# Patient Record
Sex: Male | Born: 1955
Health system: Southern US, Community
[De-identification: ages and names within clinical notes are randomized; demographics above are authoritative.]

## PROBLEM LIST (undated history)

## (undated) DIAGNOSIS — I10 Essential (primary) hypertension: Secondary | ICD-10-CM

## (undated) DIAGNOSIS — E559 Vitamin D deficiency, unspecified: Secondary | ICD-10-CM

## (undated) DIAGNOSIS — E119 Type 2 diabetes mellitus without complications: Secondary | ICD-10-CM

## (undated) DIAGNOSIS — E782 Mixed hyperlipidemia: Secondary | ICD-10-CM

## (undated) HISTORY — DX: Mixed hyperlipidemia: E78.2

## (undated) HISTORY — DX: Vitamin D deficiency, unspecified: E55.9

## (undated) HISTORY — DX: Essential (primary) hypertension: I10

## (undated) HISTORY — DX: Type 2 diabetes mellitus without complications: E11.9

---

## 1998-09-27 ENCOUNTER — Ambulatory Visit (HOSPITAL_COMMUNITY): Admission: RE | Admit: 1998-09-27 | Discharge: 1998-09-27 | Payer: Self-pay | Admitting: *Deleted

## 1998-09-27 ENCOUNTER — Encounter: Payer: Self-pay | Admitting: *Deleted

## 1998-11-01 ENCOUNTER — Emergency Department (HOSPITAL_COMMUNITY): Admission: EM | Admit: 1998-11-01 | Discharge: 1998-11-01 | Payer: Self-pay

## 1998-12-17 ENCOUNTER — Encounter: Payer: Self-pay | Admitting: Orthopedic Surgery

## 1998-12-17 ENCOUNTER — Ambulatory Visit (HOSPITAL_COMMUNITY): Admission: RE | Admit: 1998-12-17 | Discharge: 1998-12-17 | Payer: Self-pay | Admitting: Orthopedic Surgery

## 1999-01-30 ENCOUNTER — Encounter: Payer: Self-pay | Admitting: Neurosurgery

## 1999-02-01 ENCOUNTER — Inpatient Hospital Stay (HOSPITAL_COMMUNITY): Admission: RE | Admit: 1999-02-01 | Discharge: 1999-02-03 | Payer: Self-pay | Admitting: Neurosurgery

## 1999-02-01 ENCOUNTER — Encounter: Payer: Self-pay | Admitting: Neurosurgery

## 1999-09-08 ENCOUNTER — Emergency Department (HOSPITAL_COMMUNITY): Admission: EM | Admit: 1999-09-08 | Discharge: 1999-09-08 | Payer: Self-pay | Admitting: Emergency Medicine

## 2000-09-30 ENCOUNTER — Emergency Department (HOSPITAL_COMMUNITY): Admission: EM | Admit: 2000-09-30 | Discharge: 2000-09-30 | Payer: Self-pay | Admitting: Emergency Medicine

## 2000-11-05 ENCOUNTER — Encounter: Admission: RE | Admit: 2000-11-05 | Discharge: 2000-11-05 | Payer: Self-pay | Admitting: Internal Medicine

## 2001-05-20 ENCOUNTER — Emergency Department (HOSPITAL_COMMUNITY): Admission: EM | Admit: 2001-05-20 | Discharge: 2001-05-20 | Payer: Self-pay | Admitting: Emergency Medicine

## 2001-08-18 HISTORY — PX: CERVICAL DISCECTOMY: SHX98

## 2003-05-25 ENCOUNTER — Ambulatory Visit (HOSPITAL_COMMUNITY): Admission: RE | Admit: 2003-05-25 | Discharge: 2003-05-25 | Payer: Self-pay | Admitting: Internal Medicine

## 2003-05-25 ENCOUNTER — Encounter: Payer: Self-pay | Admitting: Internal Medicine

## 2003-12-24 ENCOUNTER — Emergency Department (HOSPITAL_COMMUNITY): Admission: EM | Admit: 2003-12-24 | Discharge: 2003-12-24 | Payer: Self-pay | Admitting: Emergency Medicine

## 2004-10-07 ENCOUNTER — Emergency Department (HOSPITAL_COMMUNITY): Admission: EM | Admit: 2004-10-07 | Discharge: 2004-10-07 | Payer: Self-pay | Admitting: Emergency Medicine

## 2005-06-23 ENCOUNTER — Emergency Department (HOSPITAL_COMMUNITY): Admission: EM | Admit: 2005-06-23 | Discharge: 2005-06-23 | Payer: Self-pay | Admitting: *Deleted

## 2006-08-18 HISTORY — PX: CARPAL TUNNEL RELEASE: SHX101

## 2007-01-01 ENCOUNTER — Ambulatory Visit (HOSPITAL_BASED_OUTPATIENT_CLINIC_OR_DEPARTMENT_OTHER): Admission: RE | Admit: 2007-01-01 | Discharge: 2007-01-01 | Payer: Self-pay | Admitting: Orthopedic Surgery

## 2007-02-23 ENCOUNTER — Ambulatory Visit (HOSPITAL_BASED_OUTPATIENT_CLINIC_OR_DEPARTMENT_OTHER): Admission: RE | Admit: 2007-02-23 | Discharge: 2007-02-23 | Payer: Self-pay | Admitting: Orthopedic Surgery

## 2007-07-02 ENCOUNTER — Emergency Department (HOSPITAL_COMMUNITY): Admission: EM | Admit: 2007-07-02 | Discharge: 2007-07-02 | Payer: Self-pay | Admitting: Emergency Medicine

## 2007-11-16 ENCOUNTER — Emergency Department (HOSPITAL_COMMUNITY): Admission: EM | Admit: 2007-11-16 | Discharge: 2007-11-16 | Payer: Self-pay | Admitting: Family Medicine

## 2008-08-18 HISTORY — PX: KNEE ARTHROSCOPY: SUR90

## 2008-11-03 ENCOUNTER — Emergency Department (HOSPITAL_COMMUNITY): Admission: EM | Admit: 2008-11-03 | Discharge: 2008-11-03 | Payer: Self-pay | Admitting: Family Medicine

## 2009-03-26 ENCOUNTER — Ambulatory Visit (HOSPITAL_BASED_OUTPATIENT_CLINIC_OR_DEPARTMENT_OTHER): Admission: RE | Admit: 2009-03-26 | Discharge: 2009-03-26 | Payer: Self-pay | Admitting: Orthopedic Surgery

## 2010-11-23 LAB — BASIC METABOLIC PANEL
BUN: 14 mg/dL (ref 6–23)
CO2: 29 mEq/L (ref 19–32)
Calcium: 9.4 mg/dL (ref 8.4–10.5)
Chloride: 103 mEq/L (ref 96–112)
Creatinine, Ser: 0.93 mg/dL (ref 0.4–1.5)
GFR calc Af Amer: >60 mL/min (ref >60)
GFR calc non Af Amer: >60 mL/min (ref >60)
Glucose, Bld: 128 mg/dL — ABNORMAL HIGH (ref 70–99)
Potassium: 3.8 mEq/L (ref 3.5–5.1)
Sodium: 140 mEq/L (ref 135–145)

## 2010-11-23 LAB — POCT HEMOGLOBIN-HEMACUE: Hemoglobin: 13.5 g/dL (ref 13.0–17.0)

## 2010-12-31 NOTE — Op Note (Signed)
[name], [name]              ACCOUNT NO.:  [id number]   MEDICAL RECORD NO.:  [id number]          PATIENT TYPE:  AMB   LOCATION:  DSC                          FACILITY:  [facility]   PHYSICIAN:  Katy Fitch. Sypher, M.D. DATE OF BIRTH:  29-Feb-1956   DATE OF PROCEDURE:  02/23/2007  DATE OF DISCHARGE:                               OPERATIVE REPORT   PREOPERATIVE DIAGNOSIS:  Entrapment neuropathy median nerve right carpal  tunnel.   POSTOPERATIVE DIAGNOSIS:  Entrapment neuropathy median nerve right  carpal tunnel.   OPERATION:  Release of right transverse carpal ligament.   SURGEON:  Katy Fitch. Sypher, M.D.   ASSISTANT:  Marveen Reeks. Dasnoit, P.A.-C.   ANESTHESIA:  General by LMA.   SUPERVISING ANESTHESIOLOGIST:  Zenon Mayo, M.D.   INDICATIONS:  [name] [name] is a 55 year old employee of [employer] [employer] [address] who was referred for evaluation and management of  bilateral carpal tunnel syndrome.  He is status post left carpal tunnel  release with a satisfactory result.  He now presents for a similar  surgery on the right.  Preoperative electrodiagnostic studies documented  moderately severe right carpal tunnel syndrome.  After informed consent,  he is brought to the operating room at this time.   PROCEDURE:  [name] [name] is brought to the operating room and placed  in the supine position on the operating table.  Following induction of  general anesthesia by LMA technique, the right arm was prepped with  Betadine soap solution and sterilely draped.  A pneumatic tourniquet was  applied to the proximal right brachium.  Following exsanguination of the  right arm with an Esmarch bandage, the arterial tourniquet was inflated  to 240 mmHg.  The procedure commenced with a short incision in line with  the ring finger in the palm.  The subcutaneous tissues were carefully  divided revealing the palmar fascia.  This was split longitudinally to  the common sensory branch  of the median nerve.  These were followed back  to transverse carpal ligament which was gently isolated from the median  nerve.  The ligament was then released along its ulnar border extending  into the distal forearm.  This widely opened the carpal canal.  No mass  or other predicaments were noted.  Bleeding points along the margin of  the released ligament were electrocauterized with bipolar current  followed by repair of the skin with intradermal 3-0 Prolene suture.  A  compressive dressing was applied with a volar plaster splint maintaining  the wrist in 5 degrees of dorsiflexion.   For aftercare, [name] [name] is provided with a prescription for Vicodin  7.5 mg 1 p.o. q.4-6h. p.r.n. pain, 20 tablets without refill.      Katy Fitch Sypher, M.D.  Electronically Signed     RVS/MEDQ  D:  02/23/2007  T:  02/23/2007  Job:  [id number]

## 2010-12-31 NOTE — Op Note (Signed)
NAMEWILLY, [name] [name]              ACCOUNT NO.:  [id number]   MEDICAL RECORD NO.:  [id number]          PATIENT TYPE:  AMB   LOCATION:  DSC                          FACILITY:  [facility]   PHYSICIAN:  Katy Fitch. Sypher, M.D. DATE OF BIRTH:  [birth year]   DATE OF PROCEDURE:  01/01/2007  DATE OF DISCHARGE:                               OPERATIVE REPORT   PREOPERATIVE DIAGNOSIS:  1. Entrapment neuropathy, median nerve, left carpal tunnel.  2. Entrapment neuropathy, median nerve, right carpal tunnel.   POSTOPERATIVE DIAGNOSES:  1. Entrapment neuropathy, median nerve, left carpal tunnel.  2. Entrapment neuropathy, median nerve, right carpal tunnel.   OPERATION:  1. Release of left transverse carpal ligament and decompression of      median nerve and contents of ulnar bursa, left wrist.  2. Injection of right ulnar bursa with Depo-Medrol 40 mg/mL and      lidocaine 1%.   OPERATING SURGEON:  [name] [name], M.D.   ASSISTANT:  [name] [name], P.A.-C.   ANESTHESIA:  General by LMA.   SUPERVISING ANESTHESIOLOGIST:  Dr. Sampson [name]   INDICATIONS:  [name] [name] is a 55 year old gentleman, referred  through the courtesy of Dr. Marisue [name] for evaluation and management  of bilateral hand numbness.  Clinical examination revealed signs of  probable carpal tunnel syndrome.  Electrodiagnostic studies confirmed  significant bilateral carpal tunnel syndrome.   Due to failed response to nonoperative measures, he is brought to the  operating at this time for release, anticipating release of the left  transverse carpal ligament and injection of his right ulnar bursa.   Preoperatively, he was seen for evaluation by Dr. Jacklynn [name] and Dr.  Sampson [name] in the holding area for an anesthesia consult.  An IV was  started in the right hand.   After informed consent, he is transported to room 6, placed in supine  position on the operating table and, under Dr. Marlane [name] direct  supervision, general  anesthesia by LMA technique induced.   The left arm was prepped with Betadine soap and solution, sterilely  draped.  A pneumatic tourniquet was applied to the proximal brachium.   Following exsanguination of the right arm with an Esmarch bandage,  arterial tourniquet was inflated to 240 mmHg.   The procedure commenced with a short incision in line of the ring finger  in the palm.   [name] [name] is a very large man with a body mass index of 40 and more  than a size 10 glove-shaped hand.   We dissected the superficial palmar tissues, identifying the palmar  fascia.  This was subsequently split longitudinally to reveal the common  sensory branches of the median nerve and superficial palmar arch.   The carpal canal was more than 2.5 cm below the surface of his palm.  We  meticulously identified the distal margin of the transverse carpal  ligament and dissected the vascular structures away from the ligament.  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  Given this predicament and the incidence of  motor branch anomalies, I meticulously dissected through the horseshoe  muscle with a Penfield 4 elevator and used scissors for essentially cell  by cell division of the muscle, looking for motor branches.   I was able to reach the level of the distal wrist flexion crease without  encountering any motor branches.   At this point in the dissection, given the massive size of his hand, I  elected to create a second incision at the level of the distal resection  crease.  A transverse incision was fashioned at this level and we went  down through the superficial and  deep fascia, identifying the flexor  tendons, ulnar bursa and median nerve.   The fascia was released ulnar to the median nerve and the volar forearm  fascia released 6 cm above the distal wrist flexion crease.   The anatomy at the heel of the hand was quite complex.  The  superficial  fascia was released under direct vision from proximal to distal  direction.  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   We moved our dissection ulnar to this point and completed the release of  the transverse carpal ligament.   The wound was inspected and found to have marked edema and hyperemia of  the median nerve, deep to the proximal half the transverse carpal  ligament.  There were  no other masses noted.   Bleeding points were electrocauterized, bipolar current, followed by  repair of the skin with intradermal 3-0 Prolene suture.   [name] [name] will likely have a small amount of muscle atrophy in his  hypothenar region due to our need to dissect through his horseshoe  muscle.  There is no question we will have denervated a portion of this  atypical muscle.   The wounds were dressed with Steri-Strips, sterile gauze, sterile Webril  and a volar plaster splint.  There were no apparent complications.   For aftercare, [name] [name] is provided a prescription for Percocet 5 mg  one or two tablets p.o. q. 4-6 hours p.r.n. pain; #20 tablets without  refill.  He is also provided Motrin 6 mg one p.o. q. 6 hours  p.r.n.  pain, #30 tablets with one refill.      Katy Fitch Sypher, M.D.  Electronically Signed     RVS/MEDQ  D:  01/01/2007  T:  01/01/2007  Job:  [id number]

## 2010-12-31 NOTE — Op Note (Signed)
[name] [name], [name] [name]              ACCOUNT NO.:  [id number]   MEDICAL RECORD NO.:  [id number]          PATIENT TYPE:  AMB   LOCATION:  DSC                          FACILITY:  [facility]   PHYSICIAN:  Robert A. Thurston Hole, M.D. DATE OF BIRTH:  1955/12/20   DATE OF PROCEDURE:  03/26/2009  DATE OF DISCHARGE:                               OPERATIVE REPORT   PREOPERATIVE DIAGNOSIS:  Left knee medial and lateral meniscal tears  with chondromalacia and synovitis.   POSTOPERATIVE DIAGNOSIS:  Left knee medial and lateral meniscal tears  with chondromalacia and synovitis.   PROCEDURE:  1. Left knee examination under anesthesia followed by arthroscopic      partial medial and lateral meniscectomies.  2. Left knee chondroplasty with partial synovectomy.   SURGEON:  Elana Alm. Thurston Hole, MD   ASSISTANT:  Julien Girt, PA   ANESTHESIA:  General.   OPERATIVE TIME:  45 minutes.   COMPLICATIONS:  None.   INDICATIONS FOR PROCEDURE:  Mr. Dupre is a 55 year old gentleman who  has had significant left knee pain increasing in nature over the past 3-  4 months with exam and MRI documenting meniscal tearing with  chondromalacia and synovitis.  He has failed conservative care and is  now to undergo arthroscopy.   DESCRIPTION:  Mr. Stthomas was brought to the operating room on March 26, 2009, after a knee block was placed in the holding area by Anesthesia.  He was placed on the operative table in the supine position.  His left  knee was examined under anesthesia.  He had full range of motion of knee  and stable ligamentous exam with normal patellar tracking.  Left leg was  prepped using sterile DuraPrep and draped using sterile technique.  Originally through an anterolateral portal, the arthroscope with a pump  attached was placed into an anteromedial portal and an arthroscopic  probe was placed.  On initial inspection on medial compartment, he was  found to have 25% grade 3 and the rest grade 1 and 2  chondromalacia  which was debrided.  Medial meniscus tear, posterior medial and anterior  horn of which 40-30% was resected back to a stable rim.  Intercondylar  notch was inspected.  Anterior and posterior cruciate ligaments were  normal.  Lateral compartment was inspected.  Grade 1 and 2  chondromalacia noted.  Lateral meniscus showed partial tearing 20%  posterolateral corner which was resected back to a stable rim.  Patellofemoral joint showed 25-30% grade 3 chondromalacia which was  debrided.  The patella tracked normally.  Significant synovitis in the  medial and lateral gutters debrided, otherwise this was free of  pathology.  After this was done, it was felt that all pathology had been  satisfactorily addressed.  The instruments were removed.  Portals were  closed with 3-0 nylon suture.  Sterile dressings were applied.  The  patient was awakened and taken to the recovery room in stable condition.   FOLLOWUP CARE:  Mr. Swaim will be followed as an outpatient on Vicodin  and Mobic.  He will be seen back in the office in a week  for sutures out  and followup.      Robert A. Thurston Hole, M.D.  Electronically Signed     RAW/MEDQ  D:  03/26/2009  T:  03/27/2009  Job:  [id number]

## 2011-06-03 LAB — BASIC METABOLIC PANEL
BUN: 17
CO2: 30
Calcium: 9.5
Chloride: 101
Creatinine, Ser: 1
GFR calc Af Amer: >60
GFR calc non Af Amer: >60
Glucose, Bld: 91
Potassium: 4
Sodium: 137

## 2011-06-03 LAB — POCT HEMOGLOBIN-HEMACUE
Hemoglobin: 14.4
Operator id: 116011

## 2012-02-17 ENCOUNTER — Other Ambulatory Visit (HOSPITAL_COMMUNITY): Payer: Self-pay | Admitting: Internal Medicine

## 2012-02-17 ENCOUNTER — Ambulatory Visit (HOSPITAL_COMMUNITY)
Admission: RE | Admit: 2012-02-17 | Discharge: 2012-02-17 | Disposition: A | Discharge status: Home / Self Care | Payer: BC Managed Care – PPO | Source: Ambulatory Visit | Attending: Internal Medicine | Admitting: Internal Medicine

## 2012-02-17 DIAGNOSIS — R0989 Other specified symptoms and signs involving the circulatory and respiratory systems: Secondary | ICD-10-CM | POA: Insufficient documentation

## 2012-02-17 DIAGNOSIS — R05 Cough: Secondary | ICD-10-CM

## 2012-02-17 DIAGNOSIS — R059 Cough, unspecified: Secondary | ICD-10-CM

## 2012-02-17 DIAGNOSIS — I10 Essential (primary) hypertension: Secondary | ICD-10-CM | POA: Insufficient documentation

## 2012-02-18 ENCOUNTER — Encounter: Payer: Self-pay | Admitting: Gastroenterology

## 2012-02-25 ENCOUNTER — Encounter: Payer: Self-pay | Admitting: Gastroenterology

## 2012-02-25 ENCOUNTER — Ambulatory Visit (AMBULATORY_SURGERY_CENTER): Payer: BC Managed Care – PPO | Admitting: *Deleted

## 2012-02-25 VITALS — Ht 68.5 in | Wt 307.0 lb

## 2012-02-25 DIAGNOSIS — Z1211 Encounter for screening for malignant neoplasm of colon: Secondary | ICD-10-CM

## 2012-02-25 MED ORDER — MOVIPREP 100 G PO SOLR: ORAL | Status: DC

## 2012-03-10 ENCOUNTER — Ambulatory Visit (AMBULATORY_SURGERY_CENTER): Payer: BC Managed Care – PPO | Admitting: Gastroenterology

## 2012-03-10 ENCOUNTER — Encounter: Payer: Self-pay | Admitting: Gastroenterology

## 2012-03-10 VITALS — BP 127/59 | HR 79 | Temp 96.9°F | Resp 15 | Ht 68.0 in | Wt 307.0 lb

## 2012-03-10 DIAGNOSIS — Z1211 Encounter for screening for malignant neoplasm of colon: Secondary | ICD-10-CM

## 2012-03-10 MED ORDER — SODIUM CHLORIDE 0.9 % IV SOLN: 500.0000 mL | INTRAVENOUS | Status: DC

## 2012-03-10 NOTE — Op Note (Signed)
[facility] [address] [place], [place]  [address]  COLONOSCOPY PROCEDURE REPORT  PATIENT:  [name], [name]  MR#:  [id number] BIRTHDATE:  August 22, 1955, 56 yrs. old  GENDER:  male ENDOSCOPIST:  Vania Rea. Jarold Motto, MD, [facility] REF. BY:  Lucky Cowboy, M.D. PROCEDURE DATE:  03/10/2012 PROCEDURE:  Average-risk screening colonoscopy G0121 ASA CLASS:  Class III INDICATIONS:  Routine Risk Screening MEDICATIONS:   propofol (Diprivan) 150 mg IV  DESCRIPTION OF PROCEDURE:   After the risks and benefits and of the procedure were explained, informed consent was obtained. Digital rectal exam was performed and revealed no abnormalities. The LB CF-H180AL [id number] endoscope was introduced through the anus and advanced to the cecum, which was identified by both the appendix and ileocecal valve.  The quality of the prep was adequate, using MoviPrep.  The instrument was then slowly withdrawn as the colon was fully examined. <<PROCEDUREIMAGES>>  FINDINGS:  No polyps or cancers were seen.  This was otherwise a normal examination of the colon.   Retroflexed views in the rectum revealed no abnormalities.    The scope was then withdrawn from the patient and the procedure completed.  COMPLICATIONS:  None ENDOSCOPIC IMPRESSION: 1) No polyps or cancers 2) Otherwise normal examination RECOMMENDATIONS: 1) Continue current colorectal screening recommendations for "routine risk" patients with a repeat colonoscopy in 10 years.  REPEAT EXAM:  No  ______________________________ Vania Rea. Jarold Motto, MD, [facility]  CC:  n. eSIGNED:   Vania Rea. Patterson at 03/10/2012 10:37 AM  Evelena Leyden, [id number]

## 2012-03-10 NOTE — Patient Instructions (Signed)
YOU HAD AN ENDOSCOPIC PROCEDURE TODAY AT THE [facility]: Refer to the procedure report that was given to you for any specific questions about what was found during the examination.  If the procedure report does not answer your questions, please call your gastroenterologist to clarify.  If you requested that your care partner not be given the details of your procedure findings, then the procedure report has been included in a sealed envelope for you to review at your convenience later.  YOU SHOULD EXPECT: Some feelings of bloating in the abdomen. Passage of more gas than usual.  Walking can help get rid of the air that was put into your GI tract during the procedure and reduce the bloating. If you had a lower endoscopy (such as a colonoscopy or flexible sigmoidoscopy) you may notice spotting of blood in your stool or on the toilet paper. If you underwent a bowel prep for your procedure, then you may not have a normal bowel movement for a few days.  DIET: Your first meal following the procedure should be a light meal and then it is ok to progress to your normal diet.  A half-sandwich or bowl of soup is an example of a good first meal.  Heavy or fried foods are harder to digest and may make you feel nauseous or bloated.  Likewise meals heavy in dairy and vegetables can cause extra gas to form and this can also increase the bloating.  Drink plenty of fluids but you should avoid alcoholic beverages for 24 hours.  ACTIVITY: Your care partner should take you home directly after the procedure.  You should plan to take it easy, moving slowly for the rest of the day.  You can resume normal activity the day after the procedure however you should NOT DRIVE or use heavy machinery for 24 hours (because of the sedation medicines used during the test).    SYMPTOMS TO REPORT IMMEDIATELY: A gastroenterologist can be reached at any hour.  During normal business hours, 8:30 AM to 5:00 PM Monday through Friday,  call [phone].  After hours and on weekends, please call the GI answering service at [phone] who will take a message and have the physician on call contact you.   Following lower endoscopy (colonoscopy or flexible sigmoidoscopy):  Excessive amounts of blood in the stool  Significant tenderness or worsening of abdominal pains  Swelling of the abdomen that is new, acute  Fever of 100F or higher  Following upper endoscopy (EGD)  Vomiting of blood or coffee ground material  New chest pain or pain under the shoulder blades  Painful or persistently difficult swallowing  New shortness of breath  Fever of 100F or higher  Black, tarry-looking stools  FOLLOW UP: If any biopsies were taken you will be contacted by phone or by letter within the next 1-3 weeks.  Call your gastroenterologist if you have not heard about the biopsies in 3 weeks.  Our staff will call the home number listed on your records the next business day following your procedure to check on you and address any questions or concerns that you may have at that time regarding the information given to you following your procedure. This is a courtesy call and so if there is no answer at the home number and we have not heard from you through the emergency physician on call, we will assume that you have returned to your regular daily activities without incident.  SIGNATURES/CONFIDENTIALITY: You and/or your care   partner have signed paperwork which will be entered into your electronic medical record.  These signatures attest to the fact that that the information above on your After Visit Summary has been reviewed and is understood.  Full responsibility of the confidentiality of this discharge information lies with you and/or your care-partner.  

## 2012-03-10 NOTE — Progress Notes (Signed)
Patient did not experience any of the following events: a burn prior to discharge; a fall within the facility; wrong site/side/patient/procedure/implant event; or a hospital transfer or hospital admission upon discharge from the facility. (G8907) Patient did not have preoperative order for IV antibiotic SSI prophylaxis. (G8918)  

## 2012-03-11 ENCOUNTER — Telehealth: Payer: Self-pay

## 2012-03-11 NOTE — Telephone Encounter (Signed)
  Follow up Call-  Call back number 03/10/2012  Post procedure Call Back phone  # [phone]  Permission to leave phone message Yes     Patient questions:  Do you have a fever, pain , or abdominal swelling? no Pain Score  0 *  Have you tolerated food without any problems? yes  Have you been able to return to your normal activities? yes  Do you have any questions about your discharge instructions: Diet   no Medications  no Follow up visit  no  Do you have questions or concerns about your Care? no  Actions: * If pain score is 4 or above: No action needed, pain <4.  Per the pt, "I am fine". Maw

## 2012-09-29 ENCOUNTER — Emergency Department (HOSPITAL_COMMUNITY): Payer: BC Managed Care – PPO

## 2012-09-29 ENCOUNTER — Inpatient Hospital Stay (HOSPITAL_COMMUNITY)
Admission: EM | Admit: 2012-09-29 | Discharge: 2012-10-01 | DRG: 566 | Disposition: A | Discharge status: Home / Self Care | Payer: BC Managed Care – PPO | Attending: Internal Medicine | Admitting: Internal Medicine

## 2012-09-29 ENCOUNTER — Encounter (HOSPITAL_COMMUNITY): Payer: Self-pay | Admitting: *Deleted

## 2012-09-29 DIAGNOSIS — Y92009 Unspecified place in unspecified non-institutional (private) residence as the place of occurrence of the external cause: Secondary | ICD-10-CM

## 2012-09-29 DIAGNOSIS — T383X5A Adverse effect of insulin and oral hypoglycemic [antidiabetic] drugs, initial encounter: Secondary | ICD-10-CM | POA: Diagnosis present

## 2012-09-29 DIAGNOSIS — E669 Obesity, unspecified: Secondary | ICD-10-CM

## 2012-09-29 DIAGNOSIS — E1101 Type 2 diabetes mellitus with hyperosmolarity with coma: Secondary | ICD-10-CM

## 2012-09-29 DIAGNOSIS — K219 Gastro-esophageal reflux disease without esophagitis: Secondary | ICD-10-CM

## 2012-09-29 DIAGNOSIS — E782 Mixed hyperlipidemia: Secondary | ICD-10-CM

## 2012-09-29 DIAGNOSIS — E860 Dehydration: Secondary | ICD-10-CM

## 2012-09-29 DIAGNOSIS — Z79899 Other long term (current) drug therapy: Secondary | ICD-10-CM

## 2012-09-29 DIAGNOSIS — E875 Hyperkalemia: Secondary | ICD-10-CM

## 2012-09-29 DIAGNOSIS — N179 Acute kidney failure, unspecified: Secondary | ICD-10-CM

## 2012-09-29 DIAGNOSIS — Z6841 Body Mass Index (BMI) 40.0 and over, adult: Secondary | ICD-10-CM

## 2012-09-29 DIAGNOSIS — E1169 Type 2 diabetes mellitus with other specified complication: Secondary | ICD-10-CM | POA: Diagnosis present

## 2012-09-29 DIAGNOSIS — T46905A Adverse effect of unspecified agents primarily affecting the cardiovascular system, initial encounter: Secondary | ICD-10-CM | POA: Diagnosis present

## 2012-09-29 DIAGNOSIS — R251 Tremor, unspecified: Secondary | ICD-10-CM

## 2012-09-29 DIAGNOSIS — R259 Unspecified abnormal involuntary movements: Secondary | ICD-10-CM | POA: Diagnosis present

## 2012-09-29 DIAGNOSIS — Z87891 Personal history of nicotine dependence: Secondary | ICD-10-CM

## 2012-09-29 DIAGNOSIS — I10 Essential (primary) hypertension: Secondary | ICD-10-CM | POA: Diagnosis present

## 2012-09-29 DIAGNOSIS — E1100 Type 2 diabetes mellitus with hyperosmolarity without nonketotic hyperglycemic-hyperosmolar coma (NKHHC): Principal | ICD-10-CM

## 2012-09-29 DIAGNOSIS — J309 Allergic rhinitis, unspecified: Secondary | ICD-10-CM | POA: Diagnosis present

## 2012-09-29 DIAGNOSIS — E785 Hyperlipidemia, unspecified: Secondary | ICD-10-CM

## 2012-09-29 HISTORY — DX: Mixed hyperlipidemia: E78.2

## 2012-09-29 LAB — COMPREHENSIVE METABOLIC PANEL
ALT: 13 U/L (ref 0–53)
ALT: 12 U/L (ref 0–53)
AST: 17 U/L (ref 0–37)
AST: 16 U/L (ref 0–37)
Albumin: 4.4 g/dL (ref 3.5–5.2)
Albumin: 4.2 g/dL (ref 3.5–5.2)
Alkaline Phosphatase: 110 U/L (ref 39–117)
Alkaline Phosphatase: 104 U/L (ref 39–117)
BUN: 33 mg/dL — ABNORMAL HIGH (ref 6–23)
BUN: 32 mg/dL — ABNORMAL HIGH (ref 6–23)
CO2: 19 mEq/L (ref 19–32)
CO2: 20 mEq/L (ref 19–32)
Calcium: 10.9 mg/dL — ABNORMAL HIGH (ref 8.4–10.5)
Calcium: 10.7 mg/dL — ABNORMAL HIGH (ref 8.4–10.5)
Chloride: 88 mEq/L — ABNORMAL LOW (ref 96–112)
Chloride: 96 mEq/L (ref 96–112)
Creatinine, Ser: 1.67 mg/dL — ABNORMAL HIGH (ref 0.50–1.35)
Creatinine, Ser: 1.71 mg/dL — ABNORMAL HIGH (ref 0.50–1.35)
GFR calc Af Amer: 51 mL/min — ABNORMAL LOW (ref >90)
GFR calc Af Amer: 50 mL/min — ABNORMAL LOW (ref >90)
GFR calc non Af Amer: 44 mL/min — ABNORMAL LOW (ref >90)
GFR calc non Af Amer: 43 mL/min — ABNORMAL LOW (ref >90)
Glucose, Bld: 1233 mg/dL (ref 70–99)
Glucose, Bld: 913 mg/dL (ref 70–99)
Potassium: 6.2 mEq/L — ABNORMAL HIGH (ref 3.5–5.1)
Potassium: 4.6 mEq/L (ref 3.5–5.1)
Sodium: 133 mEq/L — ABNORMAL LOW (ref 135–145)
Sodium: 141 mEq/L (ref 135–145)
Total Bilirubin: 0.6 mg/dL (ref 0.3–1.2)
Total Bilirubin: 0.5 mg/dL (ref 0.3–1.2)
Total Protein: 9.4 g/dL — ABNORMAL HIGH (ref 6.0–8.3)
Total Protein: 9.1 g/dL — ABNORMAL HIGH (ref 6.0–8.3)

## 2012-09-29 LAB — URINALYSIS, ROUTINE W REFLEX MICROSCOPIC
Bilirubin Urine: NEGATIVE
Glucose, UA: >1000 mg/dL — AB
Hgb urine dipstick: NEGATIVE
Ketones, ur: 15 mg/dL — AB
Leukocytes, UA: NEGATIVE
Nitrite: NEGATIVE
Protein, ur: NEGATIVE mg/dL
Specific Gravity, Urine: 1.034 — ABNORMAL HIGH (ref 1.005–1.030)
Urobilinogen, UA: 0.2 mg/dL (ref 0.0–1.0)
pH: 5 (ref 5.0–8.0)

## 2012-09-29 LAB — CBC WITH DIFFERENTIAL/PLATELET
Basophils Absolute: 0 10*3/uL (ref 0.0–0.1)
Basophils Relative: 0 % (ref 0–1)
Eosinophils Absolute: 0 10*3/uL (ref 0.0–0.7)
Eosinophils Relative: 0 % (ref 0–5)
HCT: 50.8 % (ref 39.0–52.0)
Hemoglobin: 17.4 g/dL — ABNORMAL HIGH (ref 13.0–17.0)
Lymphocytes Relative: 19 % (ref 12–46)
Lymphs Abs: 1.4 10*3/uL (ref 0.7–4.0)
MCH: 27 pg (ref 26.0–34.0)
MCHC: 34.3 g/dL (ref 30.0–36.0)
MCV: 78.8 fL (ref 78.0–100.0)
Monocytes Absolute: 0.4 10*3/uL (ref 0.1–1.0)
Monocytes Relative: 5 % (ref 3–12)
Neutro Abs: 5.5 10*3/uL (ref 1.7–7.7)
Neutrophils Relative %: 76 % (ref 43–77)
Platelets: 247 10*3/uL (ref 150–400)
RBC: 6.45 MIL/uL — ABNORMAL HIGH (ref 4.22–5.81)
RDW: 12.7 % (ref 11.5–15.5)
WBC: 6.7 10*3/uL (ref 4.0–10.5)

## 2012-09-29 LAB — GLUCOSE, CAPILLARY
Glucose-Capillary: >600 mg/dL (ref 70–99)
Glucose-Capillary: >600 mg/dL (ref 70–99)
Glucose-Capillary: >600 mg/dL (ref 70–99)
Glucose-Capillary: >600 mg/dL (ref 70–99)
Glucose-Capillary: >600 mg/dL (ref 70–99)
Glucose-Capillary: 563 mg/dL (ref 70–99)
Glucose-Capillary: 497 mg/dL — ABNORMAL HIGH (ref 70–99)
Glucose-Capillary: 496 mg/dL — ABNORMAL HIGH (ref 70–99)

## 2012-09-29 LAB — CBC
HCT: 45.5 % (ref 39.0–52.0)
Hemoglobin: 15.3 g/dL (ref 13.0–17.0)
MCH: 27.1 pg (ref 26.0–34.0)
MCHC: 33.6 g/dL (ref 30.0–36.0)
MCV: 80.5 fL (ref 78.0–100.0)
Platelets: 273 10*3/uL (ref 150–400)
RBC: 5.65 MIL/uL (ref 4.22–5.81)
RDW: 13 % (ref 11.5–15.5)
WBC: 7.6 10*3/uL (ref 4.0–10.5)

## 2012-09-29 LAB — POCT I-STAT 3, VENOUS BLOOD GAS (G3P V)
Acid-base deficit: 2 mmol/L (ref 0.0–2.0)
Bicarbonate: 22.6 mEq/L (ref 20.0–24.0)
O2 Saturation: 95 %
TCO2: 24 mmol/L (ref 0–100)
pCO2, Ven: 39.3 mmHg — ABNORMAL LOW (ref 45.0–50.0)
pH, Ven: 7.368 — ABNORMAL HIGH (ref 7.250–7.300)
pO2, Ven: 76 mmHg — ABNORMAL HIGH (ref 30.0–45.0)

## 2012-09-29 LAB — ETHANOL: Alcohol, Ethyl (B): <11 mg/dL (ref 0–11)

## 2012-09-29 LAB — RAPID URINE DRUG SCREEN, HOSP PERFORMED
Amphetamines: NOT DETECTED
Barbiturates: NOT DETECTED
Benzodiazepines: NOT DETECTED
Cocaine: NOT DETECTED
Opiates: NOT DETECTED
Tetrahydrocannabinol: NOT DETECTED

## 2012-09-29 LAB — LIPASE, BLOOD: Lipase: 61 U/L — ABNORMAL HIGH (ref 11–59)

## 2012-09-29 LAB — URINE MICROSCOPIC-ADD ON: WBC, UA: NONE SEEN WBC/hpf (ref <3)

## 2012-09-29 LAB — PHOSPHORUS: Phosphorus: 5.2 mg/dL — ABNORMAL HIGH (ref 2.3–4.6)

## 2012-09-29 LAB — MAGNESIUM: Magnesium: 3.7 mg/dL — ABNORMAL HIGH (ref 1.5–2.5)

## 2012-09-29 LAB — TROPONIN I: Troponin I: <0.3 ng/mL (ref <0.30)

## 2012-09-29 MED ORDER — HYDRALAZINE HCL 20 MG/ML IJ SOLN
10.0000 mg | Freq: Three times a day (TID) | INTRAMUSCULAR | Status: DC | PRN
  Filled 2012-09-29: qty 0.5

## 2012-09-29 MED ORDER — SODIUM CHLORIDE 0.9 % IV SOLN
Freq: Once | INTRAVENOUS | Status: AC
  Administered 2012-09-29: 16:00:00 via INTRAVENOUS

## 2012-09-29 MED ORDER — DILTIAZEM HCL ER COATED BEADS 120 MG PO CP24
120.0000 mg | ORAL_CAPSULE | Freq: Every day | ORAL | Status: DC
  Administered 2012-09-29 – 2012-10-01 (×3): 120 mg via ORAL
  Filled 2012-09-29 (×3): qty 1

## 2012-09-29 MED ORDER — SODIUM POLYSTYRENE SULFONATE 15 GM/60ML PO SUSP
45.0000 g | Freq: Once | ORAL | Status: AC
  Administered 2012-09-29: 45 g via ORAL
  Filled 2012-09-29: qty 180

## 2012-09-29 MED ORDER — ATORVASTATIN CALCIUM 20 MG PO TABS
20.0000 mg | ORAL_TABLET | Freq: Every day | ORAL | Status: DC
  Administered 2012-09-29 – 2012-09-30 (×2): 20 mg via ORAL
  Filled 2012-09-29 (×3): qty 1

## 2012-09-29 MED ORDER — ACETAMINOPHEN 650 MG RE SUPP: 650.0000 mg | Freq: Four times a day (QID) | RECTAL | Status: DC | PRN

## 2012-09-29 MED ORDER — SIMVASTATIN 40 MG PO TABS: 40.0000 mg | ORAL_TABLET | Freq: Every evening | ORAL | Status: DC

## 2012-09-29 MED ORDER — SODIUM CHLORIDE 0.9 % IV SOLN
INTRAVENOUS | Status: DC
  Administered 2012-09-29 – 2012-09-30 (×2): via INTRAVENOUS

## 2012-09-29 MED ORDER — SODIUM CHLORIDE 0.9 % IV SOLN: 1.0000 g | Freq: Once | INTRAVENOUS | Status: DC

## 2012-09-29 MED ORDER — ACETAMINOPHEN 325 MG PO TABS: 650.0000 mg | ORAL_TABLET | Freq: Four times a day (QID) | ORAL | Status: DC | PRN

## 2012-09-29 MED ORDER — MORPHINE SULFATE 2 MG/ML IJ SOLN: 1.0000 mg | INTRAMUSCULAR | Status: DC | PRN

## 2012-09-29 MED ORDER — ONDANSETRON HCL 4 MG PO TABS: 4.0000 mg | ORAL_TABLET | Freq: Four times a day (QID) | ORAL | Status: DC | PRN

## 2012-09-29 MED ORDER — SODIUM CHLORIDE 0.9 % IJ SOLN
3.0000 mL | Freq: Two times a day (BID) | INTRAMUSCULAR | Status: DC
  Administered 2012-09-29 – 2012-09-30 (×2): 3 mL via INTRAVENOUS

## 2012-09-29 MED ORDER — SODIUM CHLORIDE 0.9 % IV BOLUS (SEPSIS)
1000.0000 mL | Freq: Once | INTRAVENOUS | Status: AC
  Administered 2012-09-29: 1000 mL via INTRAVENOUS

## 2012-09-29 MED ORDER — GI COCKTAIL ~~LOC~~
30.0000 mL | Freq: Two times a day (BID) | ORAL | Status: DC | PRN
  Filled 2012-09-29: qty 30

## 2012-09-29 MED ORDER — PANTOPRAZOLE SODIUM 40 MG PO TBEC
40.0000 mg | DELAYED_RELEASE_TABLET | Freq: Two times a day (BID) | ORAL | Status: DC
  Administered 2012-09-29 – 2012-10-01 (×4): 40 mg via ORAL
  Filled 2012-09-29 (×4): qty 1

## 2012-09-29 MED ORDER — INSULIN ASPART 100 UNIT/ML ~~LOC~~ SOLN
10.0000 [IU] | Freq: Once | SUBCUTANEOUS | Status: AC
  Administered 2012-09-29: 10 [IU] via INTRAVENOUS
  Filled 2012-09-29: qty 1

## 2012-09-29 MED ORDER — POLYETHYLENE GLYCOL 3350 17 G PO PACK
17.0000 g | PACK | Freq: Every day | ORAL | Status: DC
  Administered 2012-10-01: 17 g via ORAL
  Filled 2012-09-29 (×3): qty 1

## 2012-09-29 MED ORDER — ONDANSETRON HCL 4 MG/2ML IJ SOLN: 4.0000 mg | Freq: Four times a day (QID) | INTRAMUSCULAR | Status: DC | PRN

## 2012-09-29 MED ORDER — SODIUM CHLORIDE 0.9 % IV SOLN
INTRAVENOUS | Status: DC
  Administered 2012-09-29: 16.2 [IU]/h via INTRAVENOUS
  Administered 2012-09-29: 26.2 [IU]/h via INTRAVENOUS
  Administered 2012-09-29: 30.5 [IU]/h via INTRAVENOUS
  Administered 2012-09-29: 21.6 [IU]/h via INTRAVENOUS
  Administered 2012-09-29: 26.2 [IU]/h via INTRAVENOUS
  Administered 2012-09-29: 5.4 [IU]/h via INTRAVENOUS
  Administered 2012-09-30: 8.4 [IU]/h via INTRAVENOUS
  Administered 2012-09-30: 15.9 [IU]/h via INTRAVENOUS
  Administered 2012-09-30: 16.4 [IU]/h via INTRAVENOUS
  Administered 2012-09-30: 18.8 [IU]/h via INTRAVENOUS
  Administered 2012-09-30: 21.4 [IU]/h via INTRAVENOUS
  Administered 2012-09-30: 9.8 [IU]/h via INTRAVENOUS
  Filled 2012-09-29 (×3): qty 1

## 2012-09-29 MED ORDER — HEPARIN SODIUM (PORCINE) 5000 UNIT/ML IJ SOLN
5000.0000 [IU] | Freq: Three times a day (TID) | INTRAMUSCULAR | Status: DC
  Administered 2012-09-29 – 2012-10-01 (×6): 5000 [IU] via SUBCUTANEOUS
  Filled 2012-09-29 (×9): qty 1

## 2012-09-29 NOTE — ED Provider Notes (Signed)
History     CSN: [id number]  Arrival date & time 09/29/12  1118   First MD Initiated Contact with Patient 09/29/12 1129      Chief Complaint  Patient presents with  . Tremors    (Consider location/radiation/quality/duration/timing/severity/associated sxs/prior treatment) HPI  The patient presents with new shakiness.  He states that until 2 days ago he was in his usual state of health.  He states that he has been compliant with all medications, has no similar events in his past.  He states that over the past 2 days he has had innumerable episodes of right-sided shaking and tremors.  There are no clear precipitating, alleviating, exacerbating factors. There is no associated confusion, disorientation.  There is some loss of intentional function on the right side when there are tremors. There is no associated chest pain, lightheadedness, dyspnea, syncope, nausea, vomiting, diarrhea.   Past Medical History  Diagnosis Date  . Hypertension   . Seasonal allergies   . Hyperlipidemia     Past Surgical History  Procedure Laterality Date  . Carpal tunnel release  2008    bilateral  . Knee arthroscopy  2010    left  . Cervical discectomy  2003    Family History  Problem Relation Age of Onset  . Colon cancer Neg Hx   . Stomach cancer Neg Hx     History  Substance Use Topics  . Smoking status: Former Smoker    Quit date: 02/25/1991  . Smokeless tobacco: Never Used  . Alcohol Use: No      Review of Systems  Constitutional:       Per HPI, otherwise negative  HENT:       Per HPI, otherwise negative  Respiratory:       Per HPI, otherwise negative  Cardiovascular:       Per HPI, otherwise negative  Gastrointestinal: Negative for vomiting.  Endocrine:       Negative aside from HPI  Genitourinary:       Neg aside from HPI   Musculoskeletal:       Per HPI, otherwise negative  Skin: Negative.   Neurological: Positive for tremors. Negative for seizures, syncope, weakness  and headaches.    Allergies  Review of patient's allergies indicates no known allergies.  Home Medications   Current Outpatient Rx  Name  Route  Sig  Dispense  Refill  . Cholecalciferol (VITAMIN D-3) 5000 UNITS TABS   Oral   Take 1 tablet by mouth daily.         Marland Kitchen diltiazem (CARDIZEM CD) 120 MG 24 hr capsule   Oral   Take 120 mg by mouth daily.         Marland Kitchen losartan (COZAAR) 100 MG tablet   Oral   Take 100 mg by mouth daily.         . Magnesium-Zinc (MAGNESIUM-CHELATED ZINC PO)   Oral   Take 1 tablet by mouth daily.         . metFORMIN (GLUCOPHAGE) 500 MG tablet   Oral   Take 1,000 mg by mouth 2 (two) times daily with a meal.         . Multiple Vitamins-Minerals (THERAGRAN-M PREMIER 50 PLUS PO)   Oral   Take 1 tablet by mouth daily.         Marland Kitchen omega-3 acid ethyl esters (LOVAZA) 1 G capsule   Oral   Take 1 g by mouth daily.         Marland Kitchen  simvastatin (ZOCOR) 40 MG tablet   Oral   Take 40 mg by mouth every evening.           BP 114/58  Pulse 102  Temp(Src) 98 F (36.7 C) (Oral)  Resp 20  SpO2 93%  Physical Exam  Nursing note and vitals reviewed. Constitutional: He is oriented to person, place, and time. He appears well-developed. No distress.  HENT:  Head: Normocephalic and atraumatic.  Eyes: Conjunctivae and EOM are normal.  Pupils are pinpoint, though the patient has been sitting in a dark room.  He tracks appropriately.  Cardiovascular: Normal rate and regular rhythm.   Pulmonary/Chest: Effort normal. No stridor. No respiratory distress.  Abdominal: He exhibits no distension.  Musculoskeletal: He exhibits no edema.  Neurological: He is alert and oriented to person, place, and time.  With testing the patient has appropriate strength bilaterally.  There are intermittent episodes of clonus throughout the physical exam.  These occurred both the right upper and right lower extremity.  They're not consistent, and they do not seem to occur with any  clear precipitant.  Skin: Skin is warm and dry.  Psychiatric: He has a normal mood and affect.    ED Course  Procedures (including critical care time)  Labs Reviewed  URINALYSIS, ROUTINE W REFLEX MICROSCOPIC - Abnormal; Notable for the following:    Specific Gravity, Urine 1.034 (*)    Glucose, UA >1000 (*)    Ketones, ur 15 (*)    All other components within normal limits  GLUCOSE, CAPILLARY - Abnormal; Notable for the following:    Glucose-Capillary >600 (*)    All other components within normal limits  URINE RAPID DRUG SCREEN (HOSP PERFORMED)  URINE MICROSCOPIC-ADD ON  COMPREHENSIVE METABOLIC PANEL  ETHANOL  LIPASE, BLOOD  CBC WITH DIFFERENTIAL  CBC  DIFFERENTIAL  BLOOD GAS, VENOUS   Dg Chest 2 View  09/29/2012  *RADIOLOGY REPORT*  Clinical Data: Cough, dizziness, jerking right arm, history hypertension  CHEST - 2 VIEW  Comparison: 02/17/2012  Findings: Normal heart size, mediastinal contours, and pulmonary vascularity. Linear subsegmental atelectasis versus scarring at lingula. Lungs otherwise clear. No pleural effusion or pneumothorax. Prior cervical spine fusion. Bulky endplate spurs inferior thoracic spine.  IMPRESSION: Minimal linear subsegmental atelectasis versus scarring lingula.   Original Report Authenticated By: Ulyses Southward, M.D.    Ct Head Wo Contrast  09/29/2012  *RADIOLOGY REPORT*  Clinical Data: Altered mental status.  Tremors.  CT HEAD WITHOUT CONTRAST  Technique:  Contiguous axial images were obtained from the base of the skull through the vertex without contrast.  Comparison: None.  Findings: There is some mild hypoattenuation in the subcortical deep white matter most consistent with chronic microvascular ischemic change.  No evidence of acute abnormality including infarct, hemorrhage, mass lesion, mass effect, midline shift or abnormal extra-axial fluid collection.  No hydrocephalus or pneumocephalus.  The calvarium is intact.  IMPRESSION: No acute finding.    Original Report Authenticated By: Holley Dexter, M.D.      No diagnosis found.  Pulse ox 99% room air normal Cardiac 110 sinus tach abnormal   Date: 09/29/2012  Rate: 99  Rhythm: normal sinus rhythm  QRS Axis: normal  Intervals: normal  ST/T Wave abnormalities: normal  Conduction Disutrbances:none  Narrative Interpretation:   Old EKG Reviewed: none available Q-waves inferior - abnormal  There is substantial difficulty in obtaining blood for staple initially.  After a delay the patient's blood work began to return with labs most notable for hyperglycemia.  Chem panel shows hypercalcemia, as well as hyperglycemia and acute renal dysfunction.  Kayexelate and insulin provided.  3:48 PM Patient aware of all results thus far.  Though there is some ketonuria, this appears to be c/w HONK  Update: Patient continues to have intermittent clonic activity of the right arm.  MDM  This patient with type 2 diabetes now presents with right sided clonic activity.  On exam the patient has intermittent spasms of activity, as well as miosis in spite of being in a dark room.  The patient is mentating well, with vital signs are largely reassuring.  However, the patient's labs are significantly abnormal, consistent with hyperosmolar state, with mixed picture due to the presence of ketones in his urine.  The patient received fluids, insulin, was started on an insulin drip, required admission for further evaluation and management of his hyperosmolar ketotic state.  CRITICAL CARE Performed by: Gerhard Munch   Total critical care time: 40  Critical care time was exclusive of separately billable procedures and treating other patients.  Critical care was necessary to treat or prevent imminent or life-threatening deterioration.  Critical care was time spent personally by me on the following activities: development of treatment plan with patient and/or surrogate as well as nursing, discussions with  consultants, evaluation of patient's response to treatment, examination of patient, obtaining history from patient or surrogate, ordering and performing treatments and interventions, ordering and review of laboratory studies, ordering and review of radiographic studies, pulse oximetry and re-evaluation of patient's condition.        Gerhard Munch, MD 09/29/12 [phone]

## 2012-09-29 NOTE — H&P (Signed)
[facility] History and Physical  HESHAM WOMAC ZOX:[id number] DOB: Jun 24, 1956 DOA: 09/29/2012  Referring physician: Dr. Jeraldine Loots PCP: Nadean Corwin, MD   Chief Complaint: RUE tremor, polyuria, polydipsia, dehydration and hyperglycemia  HPI: [name] [name] is a 57 y.o. male with pmh of DM, HTN, HLD, obesity; came to the hospital secondary to RUE shaking, decrease appetite and excessive urination. Patient reports for the last 2 days he has been experiencing RUE intentional tremor, associated anorexia, excessive urination, thirst and dry mouth. Patient also reports epigastric discomfort. Patient at this moment denies fever, chills, SOB, dysuria, cough or any other acute complaints. In the ED was found with potassium 6.2, CR 1.67; dehydrated with specific gravity of 1.034 and CBG's > 1000; he received kayexalate, IVF's resuscitation, insulin 10 units and TRH called to admit for further evaluation and treatment.  Of note CT head done in ED for new onset tremors was negative for acute intracranial abnormalities.  Review of Systems:  Negative except as mentioned on HPI.   Past Medical History  Diagnosis Date  . Hypertension   . Seasonal allergies   . Hyperlipidemia    Past Surgical History  Procedure Laterality Date  . Carpal tunnel release  2008    bilateral  . Knee arthroscopy  2010    left  . Cervical discectomy  2003   Social History:  reports that he quit smoking about 21 years ago. He has never used smokeless tobacco. He reports that he does not drink alcohol or use illicit drugs. Lives at home by himself; denies any assistance with ADL's.   No Known Allergies  Family History  Problem Relation Age of Onset  . Colon cancer Neg Hx   . Stomach cancer Neg Hx     Prior to Admission medications   Medication Sig Start Date End Date Taking? Authorizing Provider  Cholecalciferol (VITAMIN D-3) 5000 UNITS TABS Take 1 tablet by mouth daily.   Yes Historical Provider,  MD  diltiazem (CARDIZEM CD) 120 MG 24 hr capsule Take 120 mg by mouth daily.   Yes Historical Provider, MD  losartan (COZAAR) 100 MG tablet Take 100 mg by mouth daily.   Yes Historical Provider, MD  Magnesium-Zinc (MAGNESIUM-CHELATED ZINC PO) Take 1 tablet by mouth daily.   Yes Historical Provider, MD  metFORMIN (GLUCOPHAGE) 500 MG tablet Take 1,000 mg by mouth 2 (two) times daily with a meal.   Yes Historical Provider, MD  Multiple Vitamins-Minerals (THERAGRAN-M PREMIER 50 PLUS PO) Take 1 tablet by mouth daily.   Yes Historical Provider, MD  omega-3 acid ethyl esters (LOVAZA) 1 G capsule Take 1 g by mouth daily.   Yes Historical Provider, MD  simvastatin (ZOCOR) 40 MG tablet Take 40 mg by mouth every evening.   Yes Historical Provider, MD   Physical Exam: Filed Vitals:   09/29/12 1344 09/29/12 1500 09/29/12 1545 09/29/12 1600  BP: 114/58 108/67  125/65  Pulse: 102 99 102 109  Temp:      TempSrc:      Resp: 20 15 18 15   SpO2: 93% 93% 94% 91%     General:  NAD, afebrile, cooperative with examination  Eyes: perrla, no nystagmus, EOMI, no icterus  ENT: dry MM, no erythema or exudates, no drainage out of ears or nostrils  Neck: supple, no thyromegaly  Cardiovascular: tachycardic, no rubs, no gallops, no murmrus  Respiratory: CTA bilaterally  Abdomen: soft, NT, ND, positive BS; patient described bad reflux symptoms (epigastric burning sensation)  Skin: no  rash, no petechiae, no open wounds  Musculoskeletal: FROM, no joint swelling or erythema, no LE edema  Psychiatric: appropriate mood, no SI or hallucinations  Neurologic: AAOX3, CN intact, MS 5/5 bilaterally and simetrically; intention tremor appreciated on RUE; no other focal abnormalities seen on exam.   Labs on Admission:  Basic Metabolic Panel:  Recent Labs Lab 09/29/12 1350  NA 133*  K 6.2*  CL 88*  CO2 19  GLUCOSE 1233*  BUN 33*  CREATININE 1.67*  CALCIUM 10.9*   Liver Function Tests:  Recent Labs Lab  09/29/12 1350  AST 17  ALT 13  ALKPHOS 110  BILITOT 0.6  PROT 9.4*  ALBUMIN 4.4    Recent Labs Lab 09/29/12 1350  LIPASE 61*   CBC:  Recent Labs Lab 09/29/12 1351  WBC 6.7  NEUTROABS 5.5  HGB 17.4*  HCT 50.8  MCV 78.8  PLT 247   CBG:  Recent Labs Lab 09/29/12 1422  GLUCAP >600*    Radiological Exams on Admission: Dg Chest 2 View  09/29/2012  *RADIOLOGY REPORT*  Clinical Data: Cough, dizziness, jerking right arm, history hypertension  CHEST - 2 VIEW  Comparison: 02/17/2012  Findings: Normal heart size, mediastinal contours, and pulmonary vascularity. Linear subsegmental atelectasis versus scarring at lingula. Lungs otherwise clear. No pleural effusion or pneumothorax. Prior cervical spine fusion. Bulky endplate spurs inferior thoracic spine.  IMPRESSION: Minimal linear subsegmental atelectasis versus scarring lingula.   Original Report Authenticated By: Ulyses Southward, M.D.    Ct Head Wo Contrast  09/29/2012  *RADIOLOGY REPORT*  Clinical Data: Altered mental status.  Tremors.  CT HEAD WITHOUT CONTRAST  Technique:  Contiguous axial images were obtained from the base of the skull through the vertex without contrast.  Comparison: None.  Findings: There is some mild hypoattenuation in the subcortical deep white matter most consistent with chronic microvascular ischemic change.  No evidence of acute abnormality including infarct, hemorrhage, mass lesion, mass effect, midline shift or abnormal extra-axial fluid collection.  No hydrocephalus or pneumocephalus.  The calvarium is intact.  IMPRESSION: No acute finding.   Original Report Authenticated By: Holley Dexter, M.D.     EKG:  Rate: 99  Rhythm: normal sinus rhythm  QRS Axis: normal  Intervals: normal  ST/T Wave abnormalities: none  Conduction Disutrbances:normal  Narrative Interpretation: sinus tachycardia, no acute ischemic changes; no frank abnormalities associated with hyperkalemia. Old EKG Reviewed: none  available   Assessment/Plan 1-Uncontrolled type 2 DM with hyperosmolar nonketotic hyperglycemia: patient will be admitted to telemetry bed. -IVF's reuscitation -IV insulin -electrolytes repletion as needed -cycle BMET  -check A1C -NPO for now, while receiving IV insulin  2-Acute renal failure: secondary to dehydration, continue use of losartan and metformin. -hold nephrotoxic agents -IVF's -Follow Cr trend  3-Dehydration: as mentioned above; IVF's resuscitation and control of hyperglycemia  4-Hyperkalemia:due to hyperglycemia and ARF. -patient received kayexalate in ED -will treat hyperglycemia state with insulin (which would help hyperkalemia) -Follow electrolytes -no changes in EKG; patient admitted to telemetry  5-Hypercalcemia: due to ongoing electrolytes derangement and dehydration. -IVF's -Follow electrolytes  6-Limb tremor: most likely due to potassium and calcium abnormalities. -CT head negative -Symptoms started along with hyperglycemia symptoms. -Will follow once electrolytes abnormalities resolved  7-HTN (hypertension): stable. -Continue diltiazem -PRN hydralazine -holding losartan  8-HLD (hyperlipidemia):will check Lipid profile. Continue statins  9-GERD (gastroesophageal reflux disease):started on PPI BID  10-Obesity: low calorie diet and exercise discussed with patient.  11-Chest discomfort: due to GERD most likely. -EKG w/o abnormalities. -will admit  to telemetry -due to risk fx's (HTN, HLD, Former tobacco abuse, Age and DM) will cycle CE's and also EKG.  DVT: heparin    Code Status: Full Family Communication: no family at bedside Disposition Plan: admit to telemetry for treatment of CP, non ketotic hyperglycemia, ARF and hyperkalemia. LOS > 2 midnights  Time spent: >30 minutes  Ashleah Valtierra [facility] Pager [phone]  If 7PM-7AM, please contact night-coverage [email or web address] Password [facility] 09/29/2012, 4:48 PM

## 2012-09-29 NOTE — ED Notes (Signed)
Reports having tremors and shaking x 2 days, occurs more on right side of body.

## 2012-09-29 NOTE — Progress Notes (Signed)
CRITICAL VALUE ALERT- hyperglycemia  Critical value received:  913  Date of notification:  09/29/2012  Time of notification:  20:31  Critical value read back:yes  Nurse who received alert:  Marlon Pel    MD is aware and pt is currently on Insulin drip. B/s at this time is actually 563. Will continue to monitor

## 2012-09-29 NOTE — Progress Notes (Signed)
[name] [name] [id number] Admission Data: 09/29/2012 7:44 PM Attending Provider: Vassie Loll, MD  YQM:VHQIONG,EXBMWUX DAVID, MD Consults/ Treatment Team:    [name] [name] is a 57 y.o. male patient admitted from ED with [facility] due to DM2 awake, alert  & orientated  X 3,  Full Code, VSS - Blood pressure 136/93, pulse 115, temperature 97.9 F (36.6 C), temperature source Oral, resp. rate 18, height 5\' 8"  (1.727 m), weight 123.514 kg (272 lb 4.8 oz), SpO2 94.00%., , no c/o shortness of breath, no c/o chest pain, no distress noted. Tele # [id number] placed and pt is currently running:sinus tach   IV site WDL:  hand left, condition patent and no redness and antecubital left, condition patent and no redness with a transparent dsg that's clean dry and intact.  Allergies:  No Known Allergies   Past Medical History  Diagnosis Date  . Hypertension   . Seasonal allergies   . Hyperlipidemia     History:  obtained from the patient.  Pt orientation to unit, room and routine. Information packet given to patient/family and safety video watched.  Admission INP armband ID verified with patient/family, and in place. SR up x 2, fall risk assessment complete with Patient and family verbalizing understanding of risks associated with falls. Pt verbalizes an understanding of how to use the call bell and to call for help before getting out of bed.  Skin, clean-dry- intact without evidence of bruising, or skin tears.   No evidence of skin break down noted on exam.     Will cont to monitor and assist as needed.  Cindra Eves, RN 09/29/2012 7:44 PM

## 2012-09-30 LAB — LIPID PANEL
Cholesterol: 162 mg/dL (ref 0–200)
HDL: 44 mg/dL (ref >39)
LDL Cholesterol: 87 mg/dL (ref 0–99)
Total CHOL/HDL Ratio: 3.7 RATIO
Triglycerides: 155 mg/dL — ABNORMAL HIGH (ref <150)
VLDL: 31 mg/dL (ref 0–40)

## 2012-09-30 LAB — BASIC METABOLIC PANEL
BUN: 32 mg/dL — ABNORMAL HIGH (ref 6–23)
BUN: 33 mg/dL — ABNORMAL HIGH (ref 6–23)
CO2: 28 mEq/L (ref 19–32)
CO2: 29 mEq/L (ref 19–32)
Calcium: 10.4 mg/dL (ref 8.4–10.5)
Calcium: 9.1 mg/dL (ref 8.4–10.5)
Chloride: 109 mEq/L (ref 96–112)
Chloride: 105 mEq/L (ref 96–112)
Creatinine, Ser: 1.69 mg/dL — ABNORMAL HIGH (ref 0.50–1.35)
Creatinine, Ser: 1.71 mg/dL — ABNORMAL HIGH (ref 0.50–1.35)
GFR calc Af Amer: 51 mL/min — ABNORMAL LOW (ref >90)
GFR calc Af Amer: 50 mL/min — ABNORMAL LOW (ref >90)
GFR calc non Af Amer: 44 mL/min — ABNORMAL LOW (ref >90)
GFR calc non Af Amer: 43 mL/min — ABNORMAL LOW (ref >90)
Glucose, Bld: 137 mg/dL — ABNORMAL HIGH (ref 70–99)
Glucose, Bld: 439 mg/dL — ABNORMAL HIGH (ref 70–99)
Potassium: 3.4 mEq/L — ABNORMAL LOW (ref 3.5–5.1)
Potassium: 3.9 mEq/L (ref 3.5–5.1)
Sodium: 153 mEq/L — ABNORMAL HIGH (ref 135–145)
Sodium: 146 mEq/L — ABNORMAL HIGH (ref 135–145)

## 2012-09-30 LAB — CBC
HCT: 47.5 % (ref 39.0–52.0)
Hemoglobin: 15.9 g/dL (ref 13.0–17.0)
MCH: 26.9 pg (ref 26.0–34.0)
MCHC: 33.5 g/dL (ref 30.0–36.0)
MCV: 80.4 fL (ref 78.0–100.0)
Platelets: 281 10*3/uL (ref 150–400)
RBC: 5.91 MIL/uL — ABNORMAL HIGH (ref 4.22–5.81)
RDW: 12.7 % (ref 11.5–15.5)
WBC: 8.1 10*3/uL (ref 4.0–10.5)

## 2012-09-30 LAB — GLUCOSE, CAPILLARY
Glucose-Capillary: 295 mg/dL — ABNORMAL HIGH (ref 70–99)
Glucose-Capillary: 366 mg/dL — ABNORMAL HIGH (ref 70–99)
Glucose-Capillary: 219 mg/dL — ABNORMAL HIGH (ref 70–99)
Glucose-Capillary: 242 mg/dL — ABNORMAL HIGH (ref 70–99)
Glucose-Capillary: 158 mg/dL — ABNORMAL HIGH (ref 70–99)
Glucose-Capillary: 144 mg/dL — ABNORMAL HIGH (ref 70–99)
Glucose-Capillary: 190 mg/dL — ABNORMAL HIGH (ref 70–99)
Glucose-Capillary: 459 mg/dL — ABNORMAL HIGH (ref 70–99)
Glucose-Capillary: 460 mg/dL — ABNORMAL HIGH (ref 70–99)
Glucose-Capillary: 400 mg/dL — ABNORMAL HIGH (ref 70–99)
Glucose-Capillary: 298 mg/dL — ABNORMAL HIGH (ref 70–99)

## 2012-09-30 LAB — TROPONIN I
Troponin I: <0.3 ng/mL (ref <0.30)
Troponin I: <0.3 ng/mL (ref <0.30)

## 2012-09-30 LAB — HEMOGLOBIN A1C
Hgb A1c MFr Bld: 15 % — ABNORMAL HIGH (ref <5.7)
Mean Plasma Glucose: 384 mg/dL — ABNORMAL HIGH (ref <117)

## 2012-09-30 LAB — TSH: TSH: 0.877 u[IU]/mL (ref 0.350–4.500)

## 2012-09-30 MED ORDER — LIVING WELL WITH DIABETES BOOK
Freq: Once | Status: AC
  Administered 2012-09-30: 16:00:00
  Filled 2012-09-30: qty 1

## 2012-09-30 MED ORDER — INSULIN ASPART PROT & ASPART (70-30 MIX) 100 UNIT/ML ~~LOC~~ SUSP
18.0000 [IU] | Freq: Every day | SUBCUTANEOUS | Status: DC
  Administered 2012-09-30: 18 [IU] via SUBCUTANEOUS
  Filled 2012-09-30: qty 10

## 2012-09-30 MED ORDER — INSULIN ASPART 100 UNIT/ML ~~LOC~~ SOLN
10.0000 [IU] | Freq: Once | SUBCUTANEOUS | Status: AC
  Administered 2012-09-30: 10 [IU] via SUBCUTANEOUS

## 2012-09-30 MED ORDER — INSULIN ASPART PROT & ASPART (70-30 MIX) 100 UNIT/ML ~~LOC~~ SUSP
15.0000 [IU] | Freq: Two times a day (BID) | SUBCUTANEOUS | Status: DC
  Filled 2012-09-30: qty 10

## 2012-09-30 MED ORDER — INSULIN ASPART 100 UNIT/ML ~~LOC~~ SOLN
0.0000 [IU] | Freq: Three times a day (TID) | SUBCUTANEOUS | Status: DC
  Administered 2012-09-30: 20 [IU] via SUBCUTANEOUS
  Administered 2012-09-30: 4 [IU] via SUBCUTANEOUS
  Administered 2012-10-01 (×2): 11 [IU] via SUBCUTANEOUS

## 2012-09-30 MED ORDER — INSULIN ASPART 100 UNIT/ML ~~LOC~~ SOLN
0.0000 [IU] | Freq: Every day | SUBCUTANEOUS | Status: DC
  Administered 2012-09-30: 3 [IU] via SUBCUTANEOUS

## 2012-09-30 MED ORDER — INSULIN GLARGINE 100 UNIT/ML ~~LOC~~ SOLN
10.0000 [IU] | Freq: Every day | SUBCUTANEOUS | Status: DC
  Administered 2012-09-30: 10 [IU] via SUBCUTANEOUS

## 2012-09-30 MED ORDER — INSULIN ASPART PROT & ASPART (70-30 MIX) 100 UNIT/ML ~~LOC~~ SUSP
25.0000 [IU] | Freq: Every day | SUBCUTANEOUS | Status: DC
  Administered 2012-10-01: 25 [IU] via SUBCUTANEOUS
  Filled 2012-09-30: qty 10

## 2012-09-30 MED ORDER — POTASSIUM CHLORIDE 10 MEQ/100ML IV SOLN
10.0000 meq | INTRAVENOUS | Status: DC
  Filled 2012-09-30 (×4): qty 100

## 2012-09-30 MED ORDER — POTASSIUM CHLORIDE 10 MEQ/100ML IV SOLN
10.0000 meq | INTRAVENOUS | Status: AC
Start: 2012-09-30 — End: 2012-09-30
  Administered 2012-09-30 (×3): 10 meq via INTRAVENOUS
  Filled 2012-09-30 (×3): qty 100

## 2012-09-30 MED ORDER — SODIUM CHLORIDE 0.45 % IV SOLN
INTRAVENOUS | Status: DC
  Administered 2012-09-30 – 2012-10-01 (×2): via INTRAVENOUS

## 2012-09-30 MED ORDER — INSULIN NPH (HUMAN) (ISOPHANE) 100 UNIT/ML ~~LOC~~ SUSP
25.0000 [IU] | Freq: Once | SUBCUTANEOUS | Status: AC
  Administered 2012-09-30: 25 [IU] via SUBCUTANEOUS
  Filled 2012-09-30 (×2): qty 10

## 2012-09-30 NOTE — Progress Notes (Addendum)
Pt's CBG 459. Notified Dr. Jerral Ralph who wrote orders to give 25 units NPH and 20 units novolog. Will recheck later. [place], [employer]

## 2012-09-30 NOTE — Progress Notes (Signed)
[facility] PROGRESS NOTE  [name] [name] ZOX:[id number] DOB: 14-Apr-1956 DOA: 09/29/2012 PCP: Nadean Corwin, MD   [name] [name] is a 57 y.o. male with pmh of DM, HTN, HLD, obesity; came to the hospital secondary to RUE shaking, decrease appetite and excessive urination. Patient reports for the last 2 days he has been experiencing RUE intentional tremor, associated anorexia, excessive urination, thirst and dry mouth. Patient also reports epigastric discomfort. Patient at this moment denies fever, chills, SOB, dysuria, cough or any other acute complaints. In the ED was found with potassium 6.2, CR 1.67; dehydrated with specific gravity of 1.034 and CBG's > 1000; he received kayexalate, IVF's resuscitation, insulin 10 units and [facility] called to admit for further evaluation and treatment.   Assessment/Plan:  HONK Hyperglycemia with uncontrolled type II DM Started on novolog 70/30:  18 units at night and 25 units in the am in addition to sliding scale.  Adjust as necessary. DM Education and DM coordinator consultation ordered. Hgb A1C pending  Acute Renal Failure Secondary to dehydration with use of Metformin and Losartin Hold Nephrotoxic meds IV Hydration with 0.45 normal saline. Monitor Creatinine trend. His baseline was 1.0 in 2010  Hyperkalemia 6+ on admission.  He was given Kayexalate and insulin Now 3.4.  Will give a few runs of K as he is receiving a large amount of fluids.  Right Upper Extremity Tremor Resolved with IVF and correction of electrolytes. CT Head is negative.  Hypertension: Stable currently on cardizem.  Holding losartan  GERD: PPI  DVT Prophylaxis:  Heparin.   Code Status: full Family Communication: Disposition Plan: to home when appropriate (2/14 - 2/15)   Consultants:  DM Coordinators  HPI/Subjective: No complaints.  Pleased that his arm is no longer tremoring.  Objective: Filed Vitals:   09/29/12 2241 09/30/12 0500 09/30/12 0552  09/30/12 1334  BP: 128/78  107/74 135/89  Pulse: 111  93 94  Temp: 98.1 F (36.7 C)   98.6 F (37 C)  TempSrc: Oral  Oral Oral  Resp:   20 20  Height:      Weight:  124.467 kg (274 lb 6.4 oz)    SpO2: 92%  94% 95%    Intake/Output Summary (Last 24 hours) at 09/30/12 1404 Last data filed at 09/30/12 1300  Gross per 24 hour  Intake    720 ml  Output   2250 ml  Net  -1530 ml   Filed Weights   09/29/12 1722 09/30/12 0500  Weight: 123.514 kg (272 lb 4.8 oz) 124.467 kg (274 lb 6.4 oz)    Exam:   General:  WD, WN, AA Male, Sitting on EOB, NAD  Cardiovascular: rrr, no m/r/g, no Lee  Respiratory: CTA , no w/c/r  Abdomen: slight distention, slightly firm, nt, +BS, unable to appreciate masses  Data Reviewed: Basic Metabolic Panel:  Recent Labs Lab 09/29/12 1350 09/29/12 1757 09/30/12 0635  NA 133* 141 153*  K 6.2* 4.6 3.4*  CL 88* 96 109  CO2 19 20 28   GLUCOSE 1233* 913* 137*  BUN 33* 32* 32*  CREATININE 1.67* 1.71* 1.69*  CALCIUM 10.9* 10.7* 10.4  MG  --  3.7*  --   PHOS  --  5.2*  --    Liver Function Tests:  Recent Labs Lab 09/29/12 1350 09/29/12 1757  AST 17 16  ALT 13 12  ALKPHOS 110 104  BILITOT 0.6 0.5  PROT 9.4* 9.1*  ALBUMIN 4.4 4.2    Recent Labs Lab 09/29/12 1350  LIPASE 61*   CBC:  Recent Labs Lab 09/29/12 1351 09/29/12 1757 09/30/12 0635  WBC 6.7 7.6 8.1  NEUTROABS 5.5  --   --   HGB 17.4* 15.3 15.9  HCT 50.8 45.5 47.5  MCV 78.8 80.5 80.4  PLT 247 273 281   Cardiac Enzymes:  Recent Labs Lab 09/29/12 1738 09/29/12 2253 09/30/12 0635  TROPONINI <0.30 <0.30 <0.30   CBG:  Recent Labs Lab 09/30/12 0416 09/30/12 0524 09/30/12 0741 09/30/12 1231 09/30/12 1331  GLUCAP 158* 144* 190* 459* 460*      Studies: Dg Chest 2 View  09/29/2012  *RADIOLOGY REPORT*  Clinical Data: Cough, dizziness, jerking right arm, history hypertension  CHEST - 2 VIEW  Comparison: 02/17/2012  Findings: Normal heart size, mediastinal  contours, and pulmonary vascularity. Linear subsegmental atelectasis versus scarring at lingula. Lungs otherwise clear. No pleural effusion or pneumothorax. Prior cervical spine fusion. Bulky endplate spurs inferior thoracic spine.  IMPRESSION: Minimal linear subsegmental atelectasis versus scarring lingula.   Original Report Authenticated By: Ulyses Southward, M.D.    Ct Head Wo Contrast  09/29/2012  *RADIOLOGY REPORT*  Clinical Data: Altered mental status.  Tremors.  CT HEAD WITHOUT CONTRAST  Technique:  Contiguous axial images were obtained from the base of the skull through the vertex without contrast.  Comparison: None.  Findings: There is some mild hypoattenuation in the subcortical deep white matter most consistent with chronic microvascular ischemic change.  No evidence of acute abnormality including infarct, hemorrhage, mass lesion, mass effect, midline shift or abnormal extra-axial fluid collection.  No hydrocephalus or pneumocephalus.  The calvarium is intact.  IMPRESSION: No acute finding.   Original Report Authenticated By: Holley Dexter, M.D.     Scheduled Meds: . atorvastatin  20 mg Oral q1800  . diltiazem  120 mg Oral Daily  . heparin  5,000 Units Subcutaneous Q8H  . insulin aspart  0-20 Units Subcutaneous TID WC  . insulin aspart  0-5 Units Subcutaneous QHS  . insulin aspart protamine-insulin aspart  18 Units Subcutaneous Q supper  . [START ON 10/01/2012] insulin aspart protamine-insulin aspart  25 Units Subcutaneous Q breakfast  . insulin NPH  25 Units Subcutaneous Once  . pantoprazole  40 mg Oral BID  . polyethylene glycol  17 g Oral Daily  . sodium chloride  3 mL Intravenous Q12H   Continuous Infusions: . sodium chloride      Principal Problem:   Uncontrolled type 2 DM with hyperosmolar nonketotic hyperglycemia Active Problems:   Acute renal failure   Dehydration   Hyperkalemia   Hypercalcemia   Limb tremor   HTN (hypertension)   HLD (hyperlipidemia)   GERD  (gastroesophageal reflux disease)   Obesity    Time spent: 35 min    Conley Canal [facility] Pager [phone]. If 8PM-8AM, please contact night-coverage at [email or web address], password [facility] 09/30/2012, 2:04 PM  LOS: 1 day   Attending -patient seen and examined, agree with assessment and plan. Has a h/o DM -was on Metformin-admitted with severe symptomatic hyperglycemia-has been transitioned off Insulin gtt-and given 10 units of Lantus/SSI-unfortunately CBG's are back up.Patient claims he will not be able to afford Lantus/levemir-therefore will switch to Insulin 70/30. Will check BMET if patient develops recurrent hyperglycemia. Await A1C.  Windell Norfolk MD

## 2012-09-30 NOTE — Progress Notes (Addendum)
2//13/14 Pt admitted in [facility] which has resolved other than the glucose which is now elevated again.  New orders for 70/30 to start this evening.  Results for [place], CURRENT (MRN [id number]) as of 09/30/2012 15:06  Ref. Range 09/30/2012 06:35 09/30/2012 07:41 09/30/2012 12:31 09/30/2012 13:31  Glucose-Capillary Latest Range: 70-99 mg/dL  782 (H) 956 (H) 213 (H)  Sodium Latest Range: 135-145 mEq/L 153 (H)     Potassium Latest Range: 3.5-5.1 mEq/L 3.4 (L)     Chloride Latest Range: 96-112 mEq/L 109     CO2 Latest Range: 19-32 mEq/L 28     BUN Latest Range: 6-23 mg/dL 32 (H)     Creatinine Latest Range: 0.50-1.35 mg/dL 0.86 (H)     Calcium Latest Range: 8.4-10.5 mg/dL 57.8     GFR calc non Af Amer Latest Range: >90 mL/min 44 (L)     GFR calc Af Amer Latest Range: >90 mL/min 51 (L)     Glucose Latest Range: 70-99 mg/dL 469 (H)        Glucose     FS glucose >600 >600 563 497 496  366 295 242 219 158 144  190     459 460         Dose (units/hr) Insulin  16.2 Units/hr 21.6 Units/hr  26.2 Units/hr 30.5 Units/hr  21.4 Units/hr 18.8 Units/hr 16.4 Units/hr 15.9 Units/hr 9.8 Units/hr 8.4 Units/hr         Pt taken off drip with drip rate at 8.4 units/hr.  Lantus dose given at the time the IV insulin was discontinued.  Than NPH started at 1400 . Glucose of 460 mg/dL at 6295.  Noted potassium being given again due to hypokalemia as expected. Will talk with patient and order education per bedside RN/tech, videos on network, and [facility] exit care notes. Would also recommend an OP referral  Insurance is listed as [employer] which should cover education as well as an insulin pen with co-pay. Thank you, Lenor Coffin, RN, CNS, Diabetes Coordinator [phone]) Will order pt education and talk with patient regarding use of insulin  And instruction. Will order RD consult and for pt to watch DM videos as appropriate. Pt will need meter and strips as well.  Pt would benefit from OP education referral if agreeable. Thank you, Lenor Coffin, RN, CNS, Diabetes Coordinator [phone])

## 2012-09-30 NOTE — Plan of Care (Signed)
Problem: Food- and Nutrition-Related Knowledge Deficit (NB-1.1) Goal: Nutrition education Formal process to instruct or train a patient/client in a skill or to impart knowledge to help patients/clients voluntarily manage or modify food choices and eating behavior to maintain or improve health. Outcome: Completed/Met Date Met:  09/30/12  RD consulted for nutrition education regarding diabetes.   Pt reports that he did not know he had diabetes. He has been drinking coffee with a lot of sugar added and only 2 meals per day.     No results found for this basename: HGBA1C    RD provided "Carbohydrate Counting for People with Diabetes" handout from the Academy of [employer] Dietetics. Discussed different food groups and their effects on blood sugar, emphasizing carbohydrate-containing foods. Provided list of carbohydrates and recommended serving sizes of common foods.  Discussed importance of controlled and consistent carbohydrate intake throughout the day. Provided examples of ways to balance meals/snacks and encouraged intake of high-fiber, whole grain complex carbohydrates. Teach back method used.  Expect fair compliance.  Body mass index is 41.73 kg/(m^2). Pt meets criteria for Obesity Class III based on current BMI.  Current diet order is CHO Modified, patient is consuming approximately 100% of meals at this time. Labs and medications reviewed. No further nutrition interventions warranted at this time. RD contact information provided. If additional nutrition issues arise, please re-consult RD.  Kendell Bane RD, LDN, CNSC [phone] Pager [phone] After Hours Pager

## 2012-10-01 LAB — COMPREHENSIVE METABOLIC PANEL
ALT: 9 U/L (ref 0–53)
AST: 23 U/L (ref 0–37)
Albumin: 3.4 g/dL — ABNORMAL LOW (ref 3.5–5.2)
Alkaline Phosphatase: 78 U/L (ref 39–117)
BUN: 24 mg/dL — ABNORMAL HIGH (ref 6–23)
CO2: 33 mEq/L — ABNORMAL HIGH (ref 19–32)
Calcium: 8.8 mg/dL (ref 8.4–10.5)
Chloride: 105 mEq/L (ref 96–112)
Creatinine, Ser: 1.4 mg/dL — ABNORMAL HIGH (ref 0.50–1.35)
GFR calc Af Amer: 63 mL/min — ABNORMAL LOW (ref >90)
GFR calc non Af Amer: 55 mL/min — ABNORMAL LOW (ref >90)
Glucose, Bld: 274 mg/dL — ABNORMAL HIGH (ref 70–99)
Potassium: 3.3 mEq/L — ABNORMAL LOW (ref 3.5–5.1)
Sodium: 146 mEq/L — ABNORMAL HIGH (ref 135–145)
Total Bilirubin: 0.6 mg/dL (ref 0.3–1.2)
Total Protein: 7.3 g/dL (ref 6.0–8.3)

## 2012-10-01 LAB — GLUCOSE, CAPILLARY
Glucose-Capillary: 256 mg/dL — ABNORMAL HIGH (ref 70–99)
Glucose-Capillary: 274 mg/dL — ABNORMAL HIGH (ref 70–99)

## 2012-10-01 MED ORDER — INSULIN NPH ISOPHANE & REGULAR (70-30) 100 UNIT/ML ~~LOC~~ SUSP: 25.0000 [IU] | Freq: Every day | SUBCUTANEOUS | Status: DC

## 2012-10-01 MED ORDER — GLIPIZIDE 5 MG PO TABS: 5.0000 mg | ORAL_TABLET | Freq: Two times a day (BID) | ORAL | Status: DC

## 2012-10-01 MED ORDER — INSULIN ASPART PROT & ASPART (70-30 MIX) 100 UNIT/ML ~~LOC~~ SUSP: 28.0000 [IU] | Freq: Every day | SUBCUTANEOUS | Status: DC

## 2012-10-01 MED ORDER — POTASSIUM CHLORIDE CRYS ER 20 MEQ PO TBCR
40.0000 meq | EXTENDED_RELEASE_TABLET | Freq: Once | ORAL | Status: AC
  Administered 2012-10-01: 40 meq via ORAL
  Filled 2012-10-01: qty 2

## 2012-10-01 MED ORDER — INSULIN ASPART PROT & ASPART (70-30 MIX) 100 UNIT/ML ~~LOC~~ SUSP: 20.0000 [IU] | Freq: Every day | SUBCUTANEOUS | Status: DC

## 2012-10-01 NOTE — Progress Notes (Signed)
NURSING PROGRESS NOTE  [name] [name] [id number] Discharge Data: 10/01/2012 3:54 PM Attending Provider: Maretta Bees, MD VHQ:[id number] DAVID, MD     Steva Colder to be D/C'd Home per MD order.  Discussed with the patient the After Visit Summary and all questions fully answered. All IV's discontinued with no bleeding noted. All belongings returned to patient for patient to take home.   Last Vital Signs:  Blood pressure 137/78, pulse 95, temperature 98.4 F (36.9 C), temperature source Oral, resp. rate 18, height 5\' 8"  (1.727 m), weight 124.739 kg (275 lb), SpO2 94.00%.  Discharge Medication List   Medication List    STOP taking these medications       metFORMIN 500 MG tablet  Commonly known as:  GLUCOPHAGE      TAKE these medications       diltiazem 120 MG 24 hr capsule  Commonly known as:  CARDIZEM CD  Take 120 mg by mouth daily.     glipiZIDE 5 MG tablet  Commonly known as:  GLUCOTROL  Take 1 tablet (5 mg total) by mouth 2 (two) times daily before a meal.     insulin NPH-insulin regular (70-30) 100 UNIT/ML injection  Commonly known as:  NOVOLIN 70/30  Inject 25 Units into the skin daily with breakfast. And 18 units at Dinner     losartan 100 MG tablet  Commonly known as:  COZAAR  Take 100 mg by mouth daily.     MAGNESIUM-CHELATED ZINC PO  Take 1 tablet by mouth daily.     omega-3 acid ethyl esters 1 G capsule  Commonly known as:  LOVAZA  Take 1 g by mouth daily.     simvastatin 40 MG tablet  Commonly known as:  ZOCOR  Take 40 mg by mouth every evening.     THERAGRAN-M PREMIER 50 PLUS PO  Take 1 tablet by mouth daily.     Vitamin D-3 5000 UNITS Tabs  Take 1 tablet by mouth daily.        Rosalie Doctor, RN

## 2012-10-01 NOTE — Progress Notes (Signed)
Inpatient Diabetes Program Recommendations  AACE/ADA: New Consensus Statement on Inpatient Glycemic Control (2013)  Target Ranges:  Prepandial:   less than 140 mg/dL      Peak postprandial:   less than 180 mg/dL (1-2 hours)      Critically ill patients:  140 - 180 mg/dL   Reason for Visit: Spoke to patient prior to discharge home.  He states that he has given himself a shot and knows how to check his blood sugar.  His father has diabetes and he has used his meter before.  Explained the importance of taking 70/30 insulin with breakfast and supper.  Also discussed normal CBG levels 80-130 mg/dL and hypoglycemic signs, symptoms and treatment.  Patient was able to teach back proper treatment of low CBG.  Discussed storage of proper storage of insulin and also the importance of monitoring tid and at HS.  Patient is to write down CBG values and take with him to dr's appointment.  Will order outpatient diabetes follow-up at [facility] per protocol (patient lives in [place]).

## 2012-10-01 NOTE — Discharge Summary (Signed)
Physician Discharge Summary  TYREON FRIGON ZOX:[id number] DOB: 12-21-1955 DOA: 09/29/2012  PCP: Nadean Corwin, MD  Admit date: 09/29/2012 Discharge date: 10/01/2012  Time spent: 45 minutes  Recommendations for Outpatient Follow-up:  Follow up with primary care physician in 1 week for diabetic management. HGb A1C 15 BMET in 1 week to check kidney function and potassium Will need outpatient diabetic education.  Discharge Diagnoses:  Principal Problem:   Uncontrolled type 2 DM with hyperosmolar nonketotic hyperglycemia Active Problems:   Acute renal failure   Dehydration   Hyperkalemia   Hypercalcemia   Limb tremor   HTN (hypertension)   HLD (hyperlipidemia)   GERD (gastroesophageal reflux disease)   Obesity   Discharge Condition: much improved.  Stable.  On insulin.  Diet recommendation: carb modified.  Filed Weights   09/29/12 1722 09/30/12 0500 10/01/12 0519  Weight: 123.514 kg (272 lb 4.8 oz) 124.467 kg (274 lb 6.4 oz) 124.739 kg (275 lb)    History of present illness:  RAYVON DAKIN is a 57 y.o. male with pmh of DM, HTN, HLD, obesity; came to the hospital secondary to RUE shaking, decrease appetite and excessive urination. Patient reports for the last 2 days he has been experiencing RUE intentional tremor, associated anorexia, excessive urination, thirst and dry mouth. Patient also reports epigastric discomfort. Patient at this moment denies fever, chills, SOB, dysuria, cough or any other acute complaints. In the ED was found with potassium 6.2, CR 1.67; dehydrated with specific gravity of 1.034 and CBG's > 1000; he received kayexalate, IVF's resuscitation, insulin 10 units and TR[facility] called to admit for further evaluation and treatment.  Of note CT head done in ED for new onset tremors was negative for acute intracranial abnormalities.  Hospital Course:   HONK Hyperglycemia with uncontrolled type II DM  Patient was admitted, started on Insulin gtt-then  transitioned to Subcutaneous Insulin. Since he claimed that he had financial issues and could not afford Levemir or Lantus-he was placed on Insulin 70/30/ Will continue with novolin 70/30: 18 units at night and 25 units in the am. Further optimization will need to be done in the outpatient setting   DM Education and DM coordinator consultation ordered.  Given mild renal failure, will stop metformin on discharge, place him on glipizide. Hgb A1C is 15.0.  Acute Renal Failure  Secondary to dehydration with use of Metformin and Losartin  Nephrotoxic meds were held thru the admission.  Creatinine trending down. (1.71 - 1.67)   Losartan was restarted at discharge.  BMET in 1 week.  Hyperkalemia  6+ on admission. He was given Kayexalate and insulin in the Emergency Department. Became slightly hypokalemic afterwards and required potassium supplementation.  BMET in 1 week.  Right Upper Extremity Tremor  Resolved with IVF and correction of electrolytes.  CT Head is negative.   Hypertension:  Stable currently on cardizem. Will restart losartan at discharge as his renal function has improved significantly with hydration.  GERD:  PPI  Consultations:  DM Coordinators.  Discharge Exam: Filed Vitals:   09/30/12 1334 09/30/12 2120 10/01/12 0421 10/01/12 0519  BP: 135/89 113/81 137/78   Pulse: 94 96 95   Temp: 98.6 F (37 C) 98.3 F (36.8 C) 98.4 F (36.9 C)   TempSrc: Oral Oral Oral   Resp: 20 16 18    Height:      Weight:    124.739 kg (275 lb)  SpO2: 95% 94% 94%     General: WD, WN Obese,  AA Male, NAD,  Pleasant Cardiovascular: RRR no M/R/G Respiratory: No W/C/R no accessory muscle movement  Abdomen:  Protuberant, +BS, Non Tender, unable to appreciate masses Extremities:  No Swelling.  Able to move all 4.  Discharge Instructions      Discharge Orders   Future Orders Complete By Expires     Diet Carb Modified  As directed     Increase activity slowly  As directed          Medication List    STOP taking these medications       metFORMIN 500 MG tablet  Commonly known as:  GLUCOPHAGE      TAKE these medications       diltiazem 120 MG 24 hr capsule  Commonly known as:  CARDIZEM CD  Take 120 mg by mouth daily.     glipiZIDE 5 MG tablet  Commonly known as:  GLUCOTROL  Take 1 tablet (5 mg total) by mouth 2 (two) times daily before a meal.     insulin NPH-insulin regular (70-30) 100 UNIT/ML injection  Commonly known as:  NOVOLIN 70/30  Inject 25 Units into the skin daily with breakfast. And 18 units at Dinner     losartan 100 MG tablet  Commonly known as:  COZAAR  Take 100 mg by mouth daily.     MAGNESIUM-CHELATED ZINC PO  Take 1 tablet by mouth daily.     omega-3 acid ethyl esters 1 G capsule  Commonly known as:  LOVAZA  Take 1 g by mouth daily.     simvastatin 40 MG tablet  Commonly known as:  ZOCOR  Take 40 mg by mouth every evening.     THERAGRAN-M PREMIER 50 PLUS PO  Take 1 tablet by mouth daily.     Vitamin D-3 5000 UNITS Tabs  Take 1 tablet by mouth daily.       Follow-up Information   Follow up with Nadean Corwin, MD. (See your Primary Care Physician in 1 week for Hospital Follow up.)    Contact information:           The results of significant diagnostics from this hospitalization (including imaging, microbiology, ancillary and laboratory) are listed below for reference.    Significant Diagnostic Studies: Dg Chest 2 View  09/29/2012  *RADIOLOGY REPORT*  Clinical Data: Cough, dizziness, jerking right arm, history hypertension  CHEST - 2 VIEW  Comparison: 02/17/2012  Findings: Normal heart size, mediastinal contours, and pulmonary vascularity. Linear subsegmental atelectasis versus scarring at lingula. Lungs otherwise clear. No pleural effusion or pneumothorax. Prior cervical spine fusion. Bulky endplate spurs inferior thoracic spine.  IMPRESSION: Minimal linear subsegmental atelectasis versus scarring lingula.    Original Report Authenticated By: Ulyses Southward, M.D.    Ct Head Wo Contrast  09/29/2012  *RADIOLOGY REPORT*  Clinical Data: Altered mental status.  Tremors.  CT HEAD WITHOUT CONTRAST  Technique:  Contiguous axial images were obtained from the base of the skull through the vertex without contrast.  Comparison: None.  Findings: There is some mild hypoattenuation in the subcortical deep white matter most consistent with chronic microvascular ischemic change.  No evidence of acute abnormality including infarct, hemorrhage, mass lesion, mass effect, midline shift or abnormal extra-axial fluid collection.  No hydrocephalus or pneumocephalus.  The calvarium is intact.  IMPRESSION: No acute finding.   Original Report Authenticated By: Holley Dexter, M.D.       Labs: Basic Metabolic Panel:  Recent Labs Lab 09/29/12 1350 09/29/12 1757 09/30/12 0981 09/30/12 1532 10/01/12 0630  NA  133* 141 153* 146* 146*  K 6.2* 4.6 3.4* 3.9 3.3*  CL 88* 96 109 105 105  CO2 19 20 28 29  33*  GLUCOSE 1233* 913* 137* 439* 274*  BUN 33* 32* 32* 33* 24*  CREATININE 1.67* 1.71* 1.69* 1.71* 1.40*  CALCIUM 10.9* 10.7* 10.4 9.1 8.8  MG  --  3.7*  --   --   --   PHOS  --  5.2*  --   --   --    Liver Function Tests:  Recent Labs Lab 09/29/12 1350 09/29/12 1757 10/01/12 0630  AST 17 16 23   ALT 13 12 9   ALKPHOS 110 104 78  BILITOT 0.6 0.5 0.6  PROT 9.4* 9.1* 7.3  ALBUMIN 4.4 4.2 3.4*    Recent Labs Lab 09/29/12 1350  LIPASE 61*   CBC:  Recent Labs Lab 09/29/12 1351 09/29/12 1757 09/30/12 0635  WBC 6.7 7.6 8.1  NEUTROABS 5.5  --   --   HGB 17.4* 15.3 15.9  HCT 50.8 45.5 47.5  MCV 78.8 80.5 80.4  PLT 247 273 281   Cardiac Enzymes:  Recent Labs Lab 09/29/12 1738 09/29/12 2253 09/30/12 0635  TROPONINI <0.30 <0.30 <0.30   CBG:  Recent Labs Lab 09/30/12 1331 09/30/12 1607 09/30/12 2204 10/01/12 0751 10/01/12 1141  GLUCAP 460* 400* 298* 256* 274*        Signed:  Evaristo Bury [phone]  [facility] 10/01/2012, 2:22 PM  Attending -Patient seen and examined, agree with the above assessment and plan. Patient is significantly better today, his sugars are significantly better as well. He will be discharged home on insulin 7030 and also on glipizide. Given his borderline renal function, will stop metformin. Extensive diabetic education has been done, patient claims he knows how to inject himself with insulin. He is stable to be discharged today, further optimization of his diabetic medications will need to be done in the outpatient setting by his primary care practitioner.  S Najmo Pardue

## 2012-10-01 NOTE — Care Management Note (Signed)
    Page 1 of 1   10/01/2012     5:53:13 PM   CARE MANAGEMENT NOTE 10/01/2012  Patient:  [name] [name], [name] [name]   Account Number:  [id number]  Date Initiated:  10/01/2012  Documentation initiated by:  Letha Cape  Subjective/Objective Assessment:   dx uncontrolled dm  admit     Action/Plan:   Anticipated DC Date:  10/01/2012   Anticipated DC Plan:  HOME/SELF CARE      DC Planning Services  CM consult      Choice offered to / List presented to:             Status of service:  Completed, signed off Medicare Important Message given?   (If response is "NO", the following Medicare IM given date fields will be blank) Date Medicare IM given:   Date Additional Medicare IM given:    Discharge Disposition:  HOME/SELF CARE  Per UR Regulation:  Reviewed for med. necessity/level of care/duration of stay  If discussed at Long Length of Stay Meetings, dates discussed:    Comments:  10/01/12 17:52 Letha Cape RN, BSN [phone] pta indep, patient for dc to home, no needs anticipated.

## 2012-10-19 ENCOUNTER — Encounter: Payer: BC Managed Care – PPO | Attending: Emergency Medicine | Admitting: Dietician

## 2012-10-19 ENCOUNTER — Encounter: Payer: Self-pay | Admitting: Dietician

## 2012-10-19 VITALS — Ht 68.5 in | Wt 282.9 lb

## 2012-10-19 DIAGNOSIS — Z713 Dietary counseling and surveillance: Secondary | ICD-10-CM | POA: Insufficient documentation

## 2012-10-19 DIAGNOSIS — E1100 Type 2 diabetes mellitus with hyperosmolarity without nonketotic hyperglycemic-hyperosmolar coma (NKHHC): Secondary | ICD-10-CM

## 2012-10-19 NOTE — Progress Notes (Signed)
Medical Nutrition Therapy:  Appt start time: 1100 end time:  1230.   Assessment:  Primary concerns today: Wants to know what kind of food I can eat. What to drink.  What do I look for on the label. New onset DM Type 2.  Diagnosed when admitted to hospital 09/29/2012 with Hyper-osmolar Non-ketotic Acidosis.  A1C at that time was 15.0% (Avg 384 mg/dl).  Hospitalized for 2 days, received some diabetes education.  D/C with 70/30 insulin twice daily and regular glucose monitoring.    MEDICATIONS:Med review completed.  Insulin 70/30 and Metformin for blood glucose control.  HYPERGLYCEMIA:  Having fewer and less frequent thirst, urinary frequency, still hungry, and still sleepy at times and having blurred vision.  HYPOGLYCEMIA: No S/S reported,  DILATED EYE EXAM:  No eye exam in the last 2 years.  FOOT SELF-EXAM:  Daily doing foot exam.  BLOOD GLUCOSE MONITORING.  Monitoring 3 times per day.  Does not have meter with him.  Fasting: 363, 360  AC Lunch:  320, did have a 116 on Sunday after working in the yard all morining.  AC Dinner: 282   DIETARY INTAKE:  Usual eating pattern includes 3 meals and currently no snacks per day.  Everyday foods include meats, starches, fruits, non-starchy vegetables, dairy..  Avoided foods include sweets, sweetened beverages.    24-hr recall:  B ( AM): 6:00 Raisin Cereal about a cup with 2% milk about 8 milk and sometimes has whole banana and coffee with S/Low.  Snk ( AM): water drinking all the time.  Not eating snacks at this time.  L ( PM): 12:00-12:30  Sandwich bread (whole wheat) 2  And slice of ham and slice of lunch meat, with mayo and drank water.  (Has purchased canned fruit.) Snk ( PM): none D ( PM): 7-7:30  Broiled chicken breast with peas and corn over a cup, drank water. Snk ( PM): none Beverages: water, coffee with sugar substitute, diet soda    Usual physical activity: Riding bike sometimes.  Working in the yard.  Estimated energy needs:  HT: 68.5 in  WT: 282.9 lb  BMI: 42,5 kg/m2   Adj WT: 200 lb(93 kg) 1700-1800 calories 195-200 g carbohydrates 130-135 g protein 47-49 g fat  Progress Towards Goal(s):  No progress.   Nutritional Diagnosis:  [place]-2.1 Inpaired nutrition utilization As related to glucose.  As evidenced by Recent hospitalization for [facility], new onset type 2 DM , A1C at 15%, with elevated fasting blood glucose levels and insulin for glucose therapy..    Intervention:  Nutrition /Diabetes Education:  Take the summary sheet that we will do today, to your next MD visit.  You are missing the Glipizide 5 mg, and the Lovaza in your group of medications.    If you get the shaky, sweaty, nervous, hungry, check blood glucose and if 80 or less treat with a sugar product.  If 90,100,110, 120, have a sandwich.  Plan to get a dilated eye exam in the next 3 months by an ophthalmologist.  Carry the peppermints or the glucose tablets with you all the time.  Especially at MD appointments.  Choose a sugar substitute and use in moderation.  Do diet soda in moderation.   Use frozen vegetables without a sauce (sauce has fat and sodium that are bad for heart and Blood pressure.  If using the canned veggies, rinse 2-3 times.   Protein is lean and boiled or baked. Meal serving is size of palm of hand.  Snack serving is about 1 ounce.  Try to have a protein serving at all meals and snacks.  Practice measuring serving sizes at home.  Fats:  Use the regular or the lite products and use the serving size from the food label.  Use bran cereal and get a box of raisins and use 2 tablespoons for your serving.  Add an egg or egg whites to the breakfast.  Use the snack list and the menu suggestions.  Aim for Dietary Pattern of 45-60 gm of Carb per meal and 15 gm of carb per snack.  Keep the meat/protein serving to size of palm of your hand and baked or boiled and very lean. Always have a protein serving with each snack.  Aim for 2-3  servings of added fat at each meal.  Handouts given during visit include:  Yellow Card with Diet Prescription and Exchange List.  Food Label with recommendations.  List of Non-starchy Vegetables  Controlling Blood Glucose with exercise, weight loss, blood glucose, fiber and salt recommendations.   Monitoring/Evaluation:  Dietary intake, exercise, blood glucose, and body weight call for f/u visit in 8-12 weeks and bring your glucose meter for review.Marland Kitchen

## 2012-10-19 NOTE — Patient Instructions (Addendum)
   Take the summary sheet that we will do today, to your next MD visit.  You are missing the Glipizide 5 mg, and the Lovaza in your group of medications.    If you get the shaky, sweaty, nervous, hungry, check blood glucose and if 80 or less treat with a sugar product.  If 90,100,110, 120, have a sandwich.  Plan to get a dilated eye exam in the next 3 months by an ophthalmologist.  Carry the peppermints or the glucose tablets with you all the time.  Especially at MD appointments.  Choose a sugar substitute and use in moderation.  Do diet soda in moderation.   Use frozen vegetables without a sauce (sauce has fat and sodium that are bad for heart and Blood pressure.  If using the canned veggies, rinse 2-3 times.   Protein is lean and boiled or baked. Meal serving is size of palm of hand.  Snack serving is about 1 ounce.  Try to have a protein serving at all meals and snacks.  Practice measuring serving sizes at home.  Fats:  Use the regular or the lite products and use the serving size from the food label.  Use bran cereal and get a box of raisins and use 2 tablespoons for your serving.  Add an egg or egg whites to the breakfast.  Use the snack list and the menu suggestions.  Aim for Dietary Pattern of 45-60 gm of Carb per meal and 15 gm of carb per snack.  Keep the meat/protein serving to size of palm of your hand and baked or boiled and very lean. Always have a protein serving with each snack.  Aim for 2-3 servings of added fat at each meal.

## 2013-09-18 ENCOUNTER — Other Ambulatory Visit: Payer: Self-pay | Admitting: Emergency Medicine

## 2013-09-26 ENCOUNTER — Ambulatory Visit (INDEPENDENT_AMBULATORY_CARE_PROVIDER_SITE_OTHER): Payer: BC Managed Care – PPO | Admitting: Emergency Medicine

## 2013-09-26 ENCOUNTER — Encounter: Payer: Self-pay | Admitting: Emergency Medicine

## 2013-09-26 VITALS — BP 136/82 | HR 100 | Temp 98.2°F | Resp 18 | Ht 68.5 in | Wt 302.0 lb

## 2013-09-26 DIAGNOSIS — E119 Type 2 diabetes mellitus without complications: Secondary | ICD-10-CM

## 2013-09-26 DIAGNOSIS — R7309 Other abnormal glucose: Secondary | ICD-10-CM

## 2013-09-26 DIAGNOSIS — E782 Mixed hyperlipidemia: Secondary | ICD-10-CM

## 2013-09-26 DIAGNOSIS — I10 Essential (primary) hypertension: Secondary | ICD-10-CM

## 2013-09-26 DIAGNOSIS — Z79899 Other long term (current) drug therapy: Secondary | ICD-10-CM

## 2013-09-26 DIAGNOSIS — E559 Vitamin D deficiency, unspecified: Secondary | ICD-10-CM

## 2013-09-26 LAB — HEPATIC FUNCTION PANEL
ALT: 11 U/L (ref 0–53)
AST: 18 U/L (ref 0–37)
Albumin: 4.3 g/dL (ref 3.5–5.2)
Alkaline Phosphatase: 60 U/L (ref 39–117)
Bilirubin, Direct: 0.1 mg/dL (ref 0.0–0.3)
Indirect Bilirubin: 0.3 mg/dL (ref 0.2–1.2)
Total Bilirubin: 0.4 mg/dL (ref 0.2–1.2)
Total Protein: 8.1 g/dL (ref 6.0–8.3)

## 2013-09-26 LAB — CBC WITH DIFFERENTIAL/PLATELET
Basophils Absolute: 0 10*3/uL (ref 0.0–0.1)
Basophils Relative: 1 % (ref 0–1)
Eosinophils Absolute: 0.2 10*3/uL (ref 0.0–0.7)
Eosinophils Relative: 3 % (ref 0–5)
HCT: 45.2 % (ref 39.0–52.0)
Hemoglobin: 14.4 g/dL (ref 13.0–17.0)
Lymphocytes Relative: 42 % (ref 12–46)
Lymphs Abs: 2.6 10*3/uL (ref 0.7–4.0)
MCH: 26.1 pg (ref 26.0–34.0)
MCHC: 31.9 g/dL (ref 30.0–36.0)
MCV: 81.9 fL (ref 78.0–100.0)
Monocytes Absolute: 0.6 10*3/uL (ref 0.1–1.0)
Monocytes Relative: 9 % (ref 3–12)
Neutro Abs: 2.7 10*3/uL (ref 1.7–7.7)
Neutrophils Relative %: 45 % (ref 43–77)
Platelets: 313 10*3/uL (ref 150–400)
RBC: 5.52 MIL/uL (ref 4.22–5.81)
RDW: 14.5 % (ref 11.5–15.5)
WBC: 6.1 10*3/uL (ref 4.0–10.5)

## 2013-09-26 LAB — LIPID PANEL
Cholesterol: 221 mg/dL — ABNORMAL HIGH (ref 0–200)
HDL: 44 mg/dL (ref >39)
LDL Cholesterol: 151 mg/dL — ABNORMAL HIGH (ref 0–99)
Total CHOL/HDL Ratio: 5 Ratio
Triglycerides: 132 mg/dL (ref <150)
VLDL: 26 mg/dL (ref 0–40)

## 2013-09-26 LAB — HEMOGLOBIN A1C
Hgb A1c MFr Bld: 6.9 % — ABNORMAL HIGH (ref <5.7)
Mean Plasma Glucose: 151 mg/dL — ABNORMAL HIGH (ref <117)

## 2013-09-26 LAB — BASIC METABOLIC PANEL WITH GFR
BUN: 19 mg/dL (ref 6–23)
CO2: 29 mEq/L (ref 19–32)
Calcium: 9.4 mg/dL (ref 8.4–10.5)
Chloride: 102 mEq/L (ref 96–112)
Creat: 1.1 mg/dL (ref 0.50–1.35)
GFR, Est African American: 86 mL/min
GFR, Est Non African American: 74 mL/min
Glucose, Bld: 97 mg/dL (ref 70–99)
Potassium: 4.3 mEq/L (ref 3.5–5.3)
Sodium: 140 mEq/L (ref 135–145)

## 2013-09-26 LAB — MAGNESIUM: Magnesium: 1.9 mg/dL (ref 1.5–2.5)

## 2013-09-26 MED ORDER — DAPAGLIFLOZIN PROPANEDIOL 10 MG PO TABS: 10.0000 mg | ORAL_TABLET | Freq: Every day | ORAL | Status: DC

## 2013-09-26 MED ORDER — GLIPIZIDE 5 MG PO TABS: 5.0000 mg | ORAL_TABLET | Freq: Two times a day (BID) | ORAL | Status: DC

## 2013-09-26 MED ORDER — LOSARTAN POTASSIUM 100 MG PO TABS: 100.0000 mg | ORAL_TABLET | Freq: Every day | ORAL | Status: DC

## 2013-09-26 MED ORDER — METFORMIN HCL 500 MG PO TABS: ORAL_TABLET | ORAL | Status: DC

## 2013-09-26 MED ORDER — SIMVASTATIN 40 MG PO TABS: 40.0000 mg | ORAL_TABLET | Freq: Every evening | ORAL | Status: DC

## 2013-09-26 MED ORDER — DILTIAZEM HCL ER COATED BEADS 120 MG PO CP24: 120.0000 mg | ORAL_CAPSULE | Freq: Every day | ORAL | Status: DC

## 2013-09-26 MED ORDER — SILDENAFIL CITRATE 100 MG PO TABS: 100.0000 mg | ORAL_TABLET | ORAL | Status: DC | PRN

## 2013-09-26 NOTE — Progress Notes (Signed)
Subjective:    Patient ID: [name] [name], male    DOB: Feb 24, 1956, 58 y.o.   MRN: [id number]  HPI Comments: 58 yo obese male presents for 3 month F/U for HTN, Cholesterol, DM, D. Deficient. He is overdue for F/u for labs. BS has been 110s, occasionally 130s. He notes occasional tingle in feet, he denies any skin break down. He has not been exercising with bad weather.  He notes BP good when he checks it. LAST LABS INSULIN 47 T 160 TG 90 L 97 D 70 He wants a RX/ SX of viagra, he notes it works well but other samples did not work.   Current Outpatient Prescriptions on File Prior to Visit  Medication Sig Dispense Refill  . Cholecalciferol (VITAMIN D-3) 5000 UNITS TABS Take 1 tablet by mouth daily.      Marland Kitchen diltiazem (CARDIZEM CD) 120 MG 24 hr capsule Take 120 mg by mouth daily.      . fish oil-omega-3 fatty acids 1000 MG capsule Take 1,400 mg by mouth daily.       Marland Kitchen glipiZIDE (GLUCOTROL) 5 MG tablet Take 1 tablet (5 mg total) by mouth 2 (two) times daily before a meal.  60 tablet  0  . losartan (COZAAR) 100 MG tablet TAKE ONE TABLET BY MOUTH ONCE DAILY  30 tablet  0  . Magnesium-Zinc (MAGNESIUM-CHELATED ZINC PO) Take 1 tablet by mouth daily.      . metFORMIN (GLUCOPHAGE) 500 MG tablet TAKE TWO TABLETS BY MOUTH TWICE DAILY  60 tablet  0  . Multiple Vitamins-Minerals (THERAGRAN-M PREMIER 50 PLUS PO) Take 1 tablet by mouth daily.      . simvastatin (ZOCOR) 40 MG tablet Take 40 mg by mouth every evening.       No current facility-administered medications on file prior to visit.   No Known Allergies Past Medical History  Diagnosis Date  . Hypertension   . Seasonal allergies   . Hyperlipidemia   . Diabetes mellitus without complication      Review of Systems  Genitourinary:       Erectile Dysfunction  Skin:       No skin breakdown on feet checks routinely  Neurological:       Occasional feet tingle  All other systems reviewed and are negative.   BP 136/82  Pulse 100  Temp(Src)  98.2 F (36.8 C) (Temporal)  Resp 18  Ht 5' 8.5" (1.74 m)  Wt 302 lb (136.986 kg)  BMI 45.25 kg/m2     Objective:   Physical Exam  Nursing note and vitals reviewed. Constitutional: He is oriented to person, place, and time. He appears well-developed and well-nourished.  Obese  HENT:  Head: Normocephalic and atraumatic.  Right Ear: External ear normal.  Left Ear: External ear normal.  Nose: Nose normal.  Eyes: Conjunctivae and EOM are normal.  Neck: Normal range of motion. Neck supple. No JVD present. No thyromegaly present.  Cardiovascular: Normal rate, regular rhythm, normal heart sounds and intact distal pulses.   Pulmonary/Chest: Effort normal and breath sounds normal.  Abdominal: Soft. Bowel sounds are normal. He exhibits no distension and no mass. There is no tenderness. There is no rebound and no guarding.  Musculoskeletal: Normal range of motion. He exhibits no edema and no tenderness.  Lymphadenopathy:    He has no cervical adenopathy.  Neurological: He is alert and oriented to person, place, and time. He has normal reflexes. No cranial nerve deficit. Coordination normal.  Skin: Skin  is warm and dry.  Psychiatric: He has a normal mood and affect. His behavior is normal. Judgment and thought content normal.          Assessment & Plan:  1.  3 month F/U for Obesity, HTN, Cholesterol,DM, D. Deficient. Needs healthy diet, cardio QD and obtain healthy weight. Check Labs, Check BP if >130/80 call office, Check BS if >200 call office. w/c if SX increase with feet tingling  2. ED- Viagra 100 mg SX #4 RX #20 use AD

## 2013-09-26 NOTE — Patient Instructions (Signed)

## 2013-09-27 LAB — INSULIN, FASTING: Insulin fasting, serum: 49 u[IU]/mL — ABNORMAL HIGH (ref 3–28)

## 2013-09-27 LAB — VITAMIN D 25 HYDROXY (VIT D DEFICIENCY, FRACTURES): Vit D, 25-Hydroxy: 57 ng/mL (ref 30–89)

## 2013-09-29 ENCOUNTER — Ambulatory Visit: Payer: Self-pay | Admitting: Emergency Medicine

## 2013-10-14 ENCOUNTER — Encounter: Payer: Self-pay | Admitting: *Deleted

## 2013-10-14 DIAGNOSIS — E559 Vitamin D deficiency, unspecified: Secondary | ICD-10-CM | POA: Insufficient documentation

## 2013-10-20 ENCOUNTER — Encounter: Payer: Self-pay | Admitting: Emergency Medicine

## 2013-10-20 ENCOUNTER — Ambulatory Visit (INDEPENDENT_AMBULATORY_CARE_PROVIDER_SITE_OTHER): Payer: BC Managed Care – PPO | Admitting: Emergency Medicine

## 2013-10-20 VITALS — BP 104/62 | HR 98 | Temp 98.4°F | Resp 18 | Ht 68.5 in | Wt 302.0 lb

## 2013-10-20 DIAGNOSIS — E782 Mixed hyperlipidemia: Secondary | ICD-10-CM

## 2013-10-20 DIAGNOSIS — E119 Type 2 diabetes mellitus without complications: Secondary | ICD-10-CM

## 2013-10-20 DIAGNOSIS — I10 Essential (primary) hypertension: Secondary | ICD-10-CM

## 2013-10-20 DIAGNOSIS — E669 Obesity, unspecified: Secondary | ICD-10-CM

## 2013-10-20 DIAGNOSIS — R5381 Other malaise: Secondary | ICD-10-CM

## 2013-10-20 DIAGNOSIS — R5383 Other fatigue: Secondary | ICD-10-CM

## 2013-10-20 DIAGNOSIS — Z1331 Encounter for screening for depression: Secondary | ICD-10-CM

## 2013-10-20 LAB — HEPATIC FUNCTION PANEL
ALT: 9 U/L (ref 0–53)
AST: 19 U/L (ref 0–37)
Albumin: 4.5 g/dL (ref 3.5–5.2)
Alkaline Phosphatase: 63 U/L (ref 39–117)
Bilirubin, Direct: 0.1 mg/dL (ref 0.0–0.3)
Indirect Bilirubin: 0.3 mg/dL (ref 0.2–1.2)
Total Bilirubin: 0.4 mg/dL (ref 0.2–1.2)
Total Protein: 8 g/dL (ref 6.0–8.3)

## 2013-10-20 LAB — BASIC METABOLIC PANEL WITH GFR
BUN: 22 mg/dL (ref 6–23)
CO2: 28 mEq/L (ref 19–32)
Calcium: 9.7 mg/dL (ref 8.4–10.5)
Chloride: 102 mEq/L (ref 96–112)
Creat: 1.04 mg/dL (ref 0.50–1.35)
GFR, Est African American: >89 mL/min
GFR, Est Non African American: 79 mL/min
Glucose, Bld: 96 mg/dL (ref 70–99)
Potassium: 4.4 mEq/L (ref 3.5–5.3)
Sodium: 138 mEq/L (ref 135–145)

## 2013-10-20 LAB — LIPID PANEL
Cholesterol: 155 mg/dL (ref 0–200)
HDL: 37 mg/dL — ABNORMAL LOW (ref >39)
LDL Cholesterol: 105 mg/dL — ABNORMAL HIGH (ref 0–99)
Total CHOL/HDL Ratio: 4.2 Ratio
Triglycerides: 67 mg/dL (ref <150)
VLDL: 13 mg/dL (ref 0–40)

## 2013-10-20 NOTE — Patient Instructions (Signed)
Diabetes and Exercise Exercising regularly is important. It is not just about losing weight. It has many health benefits, such as:  Improving your overall fitness, flexibility, and endurance.  Increasing your bone density.  Helping with weight control.  Decreasing your body fat.  Increasing your muscle strength.  Reducing stress and tension.  Improving your overall health. People with diabetes who exercise gain additional benefits because exercise:  Reduces appetite.  Improves the body's use of blood sugar (glucose).  Helps lower or control blood glucose.  Decreases blood pressure.  Helps control blood lipids (such as cholesterol and triglycerides).  Improves the body's use of the hormone insulin by:  Increasing the body's insulin sensitivity.  Reducing the body's insulin needs.  Decreases the risk for heart disease because exercising:  Lowers cholesterol and triglycerides levels.  Increases the levels of good cholesterol (such as high-density lipoproteins [HDL]) in the body.  Lowers blood glucose levels. YOUR ACTIVITY PLAN  Choose an activity that you enjoy and set realistic goals. Your health care provider or diabetes educator can help you make an activity plan that works for you. You can break activities into 2 or 3 sessions throughout the day. Doing so is as good as one long session. Exercise ideas include:  Taking the dog for a walk.  Taking the stairs instead of the elevator.  Dancing to your favorite song.  Doing your favorite exercise with a friend. RECOMMENDATIONS FOR EXERCISING WITH TYPE 1 OR TYPE 2 DIABETES   Check your blood glucose before exercising. If blood glucose levels are greater than 240 mg/dL, check for urine ketones. Do not exercise if ketones are present.  Avoid injecting insulin into areas of the body that are going to be exercised. For example, avoid injecting insulin into:  The arms when playing tennis.  The legs when  jogging.  Keep a record of:  Food intake before and after you exercise.  Expected peak times of insulin action.  Blood glucose levels before and after you exercise.  The type and amount of exercise you have done.  Review your records with your health care provider. Your health care provider will help you to develop guidelines for adjusting food intake and insulin amounts before and after exercising.  If you take insulin or oral hypoglycemic agents, watch for signs and symptoms of hypoglycemia. They include:  Dizziness.  Shaking.  Sweating.  Chills.  Confusion.  Drink plenty of water while you exercise to prevent dehydration or heat stroke. Body water is lost during exercise and must be replaced.  Talk to your health care provider before starting an exercise program to make sure it is safe for you. Remember, almost any type of activity is better than none. Document Released: 10/25/2003 Document Revised: 04/06/2013 Document Reviewed: 01/11/2013 ExitCare Patient Information 2014 [employer]. Diabetes and Foot Care Diabetes may cause you to have problems because of poor blood supply (circulation) to your feet and legs. This may cause the skin on your feet to become thinner, break easier, and heal more slowly. Your skin may become dry, and the skin may peel and crack. You may also have nerve damage in your legs and feet causing decreased feeling in them. You may not notice minor injuries to your feet that could lead to infections or more serious problems. Taking care of your feet is one of the most important things you can do for yourself.  HOME CARE INSTRUCTIONS  Wear shoes at all times, even in the house. Do not go   barefoot. Bare feet are easily injured.  Check your feet daily for blisters, cuts, and redness. If you cannot see the bottom of your feet, use a mirror or ask someone for help.  Wash your feet with warm water (do not use hot water) and mild soap. Then pat your feet  and the areas between your toes until they are completely dry. Do not soak your feet as this can dry your skin.  Apply a moisturizing lotion or petroleum jelly (that does not contain alcohol and is unscented) to the skin on your feet and to dry, brittle toenails. Do not apply lotion between your toes.  Trim your toenails straight across. Do not dig under them or around the cuticle. File the edges of your nails with an emery board or nail file.  Do not cut corns or calluses or try to remove them with medicine.  Wear clean socks or stockings every day. Make sure they are not too tight. Do not wear knee-high stockings since they may decrease blood flow to your legs.  Wear shoes that fit properly and have enough cushioning. To break in new shoes, wear them for just a few hours a day. This prevents you from injuring your feet. Always look in your shoes before you put them on to be sure there are no objects inside.  Do not cross your legs. This may decrease the blood flow to your feet.  If you find a minor scrape, cut, or break in the skin on your feet, keep it and the skin around it clean and dry. These areas may be cleansed with mild soap and water. Do not cleanse the area with peroxide, alcohol, or iodine.  When you remove an adhesive bandage, be sure not to damage the skin around it.  If you have a wound, look at it several times a day to make sure it is healing.  Do not use heating pads or hot water bottles. They may burn your skin. If you have lost feeling in your feet or legs, you may not know it is happening until it is too late.  Make sure your health care provider performs a complete foot exam at least annually or more often if you have foot problems. Report any cuts, sores, or bruises to your health care provider immediately. SEEK MEDICAL CARE IF:   You have an injury that is not healing.  You have cuts or breaks in the skin.  You have an ingrown nail.  You notice redness on your  legs or feet.  You feel burning or tingling in your legs or feet.  You have pain or cramps in your legs and feet.  Your legs or feet are numb.  Your feet always feel cold. SEEK IMMEDIATE MEDICAL CARE IF:   There is increasing redness, swelling, or pain in or around a wound.  There is a red line that goes up your leg.  Pus is coming from a wound.  You develop a fever or as directed by your health care provider.  You notice a bad smell coming from an ulcer or wound. Document Released: 08/01/2000 Document Revised: 04/06/2013 Document Reviewed: 01/11/2013 Exit[employer] Patient Information 2014 [employer].  

## 2013-10-20 NOTE — Progress Notes (Signed)
Subjective:    Patient ID: [name] [name], male    DOB: 19-Oct-1955, 58 y.o.   MRN: [id number]  HPI Comments: 58 yo male with close f/u of cholesterol with restart of Simvastatin. He has been trying to eat better. He is exercising a little more. He checks feet routinely and uses vaseline lotion to help with dryness. BS 110-120 Patient misunderstood and was taking Theodis Sato /Farxiga both.  CHOL         221   09/26/2013 HDL           44   09/26/2013 LDLCALC      151   09/26/2013 TRIG         132   09/26/2013 CHOLHDL      5.0   09/26/2013 ALT           11   09/26/2013 AST           18   09/26/2013 ALKPHOS       60   09/26/2013 BILITOT      0.4   09/26/2013 HGBA1C      6.9   09/26/2013 CREATININE     1.10   09/26/2013 BUN              19   09/26/2013 NA              140   09/26/2013 K               4.3   09/26/2013 CL              102   09/26/2013 CO2              29   09/26/2013  He notes occasional fatigue but usually improves with exercise or rest. He denies any recent falls or depression. He notes stress has improved with recent disability check coming in. He cannot afford Viagra and would like some samples. He notes it works well without any SE.    Diabetes Associated symptoms include fatigue.  Hyperlipidemia     Medication List       This list is accurate as of: 10/20/13 10:15 AM.  Always use your most recent med list.               Dapagliflozin Propanediol 10 MG Tabs  Commonly known as:  FARXIGA  Take 10 mg by mouth daily.     diltiazem 120 MG 24 hr capsule  Commonly known as:  CARDIZEM CD  Take 1 capsule (120 mg total) by mouth daily.     fish oil-omega-3 fatty acids 1000 MG capsule  Take 1,400 mg by mouth daily.     glipiZIDE 5 MG tablet  Commonly known as:  GLUCOTROL  Take 1 tablet (5 mg total) by mouth 2 (two) times daily before a meal.     losartan 100 MG tablet  Commonly known as:  COZAAR  Take 1 tablet (100 mg total) by mouth daily.     MAGNESIUM-CHELATED ZINC PO  Take 400 mg by  mouth daily.     metFORMIN 500 MG tablet  Commonly known as:  GLUCOPHAGE  2 PILLS BID     sildenafil 100 MG tablet  Commonly known as:  VIAGRA  Take 1 tablet (100 mg total) by mouth as needed for erectile dysfunction.     simvastatin 40 MG tablet  Commonly known as:  ZOCOR  Take 1 tablet (40 mg total) by mouth every evening.  THERAGRAN-M PREMIER 50 PLUS PO  Take 1 tablet by mouth daily.     Vitamin D-3 5000 UNITS Tabs  Take 1 tablet by mouth daily.        No Known Allergies Past Medical History  Diagnosis Date  . Hypertension   . Seasonal allergies   . Hyperlipidemia   . Diabetes mellitus without complication   . Vitamin D deficiency   . Obesity       Review of Systems  Constitutional: Positive for fatigue.  All other systems reviewed and are negative.   BP 104/62  Pulse 98  Temp(Src) 98.4 F (36.9 C) (Temporal)  Resp 18  Ht 5' 8.5" (1.74 m)  Wt 302 lb (136.986 kg)  BMI 45.25 kg/m2     Objective:   Physical Exam  Nursing note and vitals reviewed. Constitutional: He is oriented to person, place, and time. He appears well-developed and well-nourished.  obese  HENT:  Head: Normocephalic and atraumatic.  Right Ear: External ear normal.  Left Ear: External ear normal.  Nose: Nose normal.  Eyes: Conjunctivae and EOM are normal.  Neck: Normal range of motion. Neck supple. No JVD present. No thyromegaly present.  Cardiovascular: Normal rate, regular rhythm, normal heart sounds and intact distal pulses.   Pulmonary/Chest: Effort normal and breath sounds normal.  Abdominal: Soft. Bowel sounds are normal. He exhibits no distension and no mass. There is no tenderness. There is no rebound and no guarding.  Musculoskeletal: Normal range of motion. He exhibits no edema and no tenderness.  Lymphadenopathy:    He has no cervical adenopathy.  Neurological: He is alert and oriented to person, place, and time. He has normal reflexes. No cranial nerve deficit.  Coordination normal.  Skin: Skin is warm and dry.  Dry scaling worse around edges of both feet, callus at both heels aprox 1cm x 2cm wide   Psychiatric: He has a normal mood and affect. His behavior is normal. Judgment and thought content normal.      NEG FALL SCREEN NEG DEPRESSION SCREEN    Assessment & Plan:  1.  3 month F/U for Obesity, HTN, Cholesterol, Pre-Dm, D. Deficient, ED. Needs healthy diet, cardio QD and obtain healthy weight. Check Labs, Check BP if >130/80 call office. SX Marcelline DeistFarxiga given advised only use [place], stop Invokana.  2. Health maintenance updated/ Screening completed. Patient refuses flu shot. ADVISED epsom salt soaks for feet and vaseline treatment daily with close monitoring, w/c if SX increase or ER.  3. Fatigue- check labs, increase activity and H2O  4. ED- ADVISED WITH WEIGHT LOSS AND ROUTINE EXERCSIE MIGHT SEE IMPROVEMENT. Sx VIAGRA 50 MG ad #3 PILLS

## 2013-11-28 ENCOUNTER — Encounter (HOSPITAL_COMMUNITY): Payer: Self-pay | Admitting: Emergency Medicine

## 2013-11-28 ENCOUNTER — Emergency Department (HOSPITAL_COMMUNITY)
Admission: EM | Admit: 2013-11-28 | Discharge: 2013-11-28 | Disposition: A | Discharge status: Home / Self Care | Payer: No Typology Code available for payment source | Attending: Emergency Medicine | Admitting: Emergency Medicine

## 2013-11-28 DIAGNOSIS — Y92410 Unspecified street and highway as the place of occurrence of the external cause: Secondary | ICD-10-CM | POA: Insufficient documentation

## 2013-11-28 DIAGNOSIS — M545 Low back pain, unspecified: Secondary | ICD-10-CM

## 2013-11-28 DIAGNOSIS — E119 Type 2 diabetes mellitus without complications: Secondary | ICD-10-CM | POA: Insufficient documentation

## 2013-11-28 DIAGNOSIS — Z87891 Personal history of nicotine dependence: Secondary | ICD-10-CM | POA: Diagnosis not present

## 2013-11-28 DIAGNOSIS — E785 Hyperlipidemia, unspecified: Secondary | ICD-10-CM | POA: Diagnosis not present

## 2013-11-28 DIAGNOSIS — E559 Vitamin D deficiency, unspecified: Secondary | ICD-10-CM | POA: Insufficient documentation

## 2013-11-28 DIAGNOSIS — Z79899 Other long term (current) drug therapy: Secondary | ICD-10-CM | POA: Diagnosis not present

## 2013-11-28 DIAGNOSIS — IMO0002 Reserved for concepts with insufficient information to code with codable children: Secondary | ICD-10-CM | POA: Diagnosis present

## 2013-11-28 DIAGNOSIS — Y9389 Activity, other specified: Secondary | ICD-10-CM | POA: Diagnosis not present

## 2013-11-28 DIAGNOSIS — I10 Essential (primary) hypertension: Secondary | ICD-10-CM | POA: Insufficient documentation

## 2013-11-28 DIAGNOSIS — E669 Obesity, unspecified: Secondary | ICD-10-CM | POA: Diagnosis not present

## 2013-11-28 MED ORDER — METHOCARBAMOL 500 MG PO TABS: 500.0000 mg | ORAL_TABLET | Freq: Two times a day (BID) | ORAL | Status: DC | PRN

## 2013-11-28 MED ORDER — OXYCODONE-ACETAMINOPHEN 5-325 MG PO TABS: 1.0000 | ORAL_TABLET | ORAL | Status: DC | PRN
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
## 2013-11-28 NOTE — ED Provider Notes (Signed)
Medical screening examination/treatment/procedure(s) were performed by non-physician practitioner and as supervising physician I was immediately available for consultation/collaboration.   EKG Interpretation None      Pahoua Schreiner, MD, FACEP   Graylee Arutyunyan L Tasfia Vasseur, MD 11/28/13 1642 
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
## 2013-11-28 NOTE — Discharge Instructions (Signed)
Take the prescribed medication as directed.  May wish to apply heat to affected area to help with pain and soreness. Follow-up with your primary care physician. Return to the ED for new or worsening symptoms.

## 2013-12-07 ENCOUNTER — Encounter (HOSPITAL_COMMUNITY): Payer: Self-pay | Admitting: Emergency Medicine

## 2013-12-07 ENCOUNTER — Emergency Department (HOSPITAL_COMMUNITY)
Admission: EM | Admit: 2013-12-07 | Discharge: 2013-12-07 | Disposition: A | Discharge status: Home / Self Care | Payer: BC Managed Care – PPO | Attending: Emergency Medicine | Admitting: Emergency Medicine

## 2013-12-07 ENCOUNTER — Emergency Department (HOSPITAL_COMMUNITY): Payer: BC Managed Care – PPO

## 2013-12-07 DIAGNOSIS — E119 Type 2 diabetes mellitus without complications: Secondary | ICD-10-CM | POA: Insufficient documentation

## 2013-12-07 DIAGNOSIS — E669 Obesity, unspecified: Secondary | ICD-10-CM | POA: Diagnosis not present

## 2013-12-07 DIAGNOSIS — Z79899 Other long term (current) drug therapy: Secondary | ICD-10-CM | POA: Diagnosis not present

## 2013-12-07 DIAGNOSIS — E785 Hyperlipidemia, unspecified: Secondary | ICD-10-CM | POA: Insufficient documentation

## 2013-12-07 DIAGNOSIS — G8911 Acute pain due to trauma: Secondary | ICD-10-CM | POA: Insufficient documentation

## 2013-12-07 DIAGNOSIS — E559 Vitamin D deficiency, unspecified: Secondary | ICD-10-CM | POA: Diagnosis not present

## 2013-12-07 DIAGNOSIS — M546 Pain in thoracic spine: Secondary | ICD-10-CM | POA: Insufficient documentation

## 2013-12-07 DIAGNOSIS — Z87891 Personal history of nicotine dependence: Secondary | ICD-10-CM | POA: Insufficient documentation

## 2013-12-07 DIAGNOSIS — I10 Essential (primary) hypertension: Secondary | ICD-10-CM | POA: Insufficient documentation

## 2013-12-07 DIAGNOSIS — M549 Dorsalgia, unspecified: Secondary | ICD-10-CM

## 2013-12-07 MED ORDER — HYDROCODONE-ACETAMINOPHEN 5-325 MG PO TABS: 1.0000 | ORAL_TABLET | ORAL | Status: DC | PRN

## 2013-12-07 NOTE — ED Notes (Signed)
Patient transported to X-ray 

## 2013-12-07 NOTE — Discharge Instructions (Signed)
Back Pain, Adult Low back pain is very common. About 1 in 5 people have back pain.The cause of low back pain is rarely dangerous. The pain often gets better over time.About half of people with a sudden onset of back pain feel better in just 2 weeks. About 8 in 10 people feel better by 6 weeks.  CAUSES Some common causes of back pain include:  Strain of the muscles or ligaments supporting the spine.  Wear and tear (degeneration) of the spinal discs.  Arthritis.  Direct injury to the back. DIAGNOSIS Most of the time, the direct cause of low back pain is not known.However, back pain can be treated effectively even when the exact cause of the pain is unknown.Answering your caregiver's questions about your overall health and symptoms is one of the most accurate ways to make sure the cause of your pain is not dangerous. If your caregiver needs more information, he or she may order lab work or imaging tests (X-rays or MRIs).However, even if imaging tests show changes in your back, this usually does not require surgery. HOME CARE INSTRUCTIONS For many people, back pain returns.Since low back pain is rarely dangerous, it is often a condition that people can learn to manageon their own.   Remain active. It is stressful on the back to sit or stand in one place. Do not sit, drive, or stand in one place for more than 30 minutes at a time. Take short walks on level surfaces as soon as pain allows.Try to increase the length of time you walk each day.  Do not stay in bed.Resting more than 1 or 2 days can delay your recovery.  Do not avoid exercise or work.Your body is made to move.It is not dangerous to be active, even though your back may hurt.Your back will likely heal faster if you return to being active before your pain is gone.  Pay attention to your body when you bend and lift. Many people have less discomfortwhen lifting if they bend their knees, keep the load close to their bodies,and  avoid twisting. Often, the most comfortable positions are those that put less stress on your recovering back.  Find a comfortable position to sleep. Use a firm mattress and lie on your side with your knees slightly bent. If you lie on your back, put a pillow under your knees.  Only take over-the-counter or prescription medicines as directed by your caregiver. Over-the-counter medicines to reduce pain and inflammation are often the most helpful.Your caregiver may prescribe muscle relaxant drugs.These medicines help dull your pain so you can more quickly return to your normal activities and healthy exercise.  Put ice on the injured area.  Put ice in a plastic bag.  Place a towel between your skin and the bag.  Leave the ice on for 15-20 minutes, 03-04 times a day for the first 2 to 3 days. After that, ice and heat may be alternated to reduce pain and spasms.  Ask your caregiver about trying back exercises and gentle massage. This may be of some benefit.  Avoid feeling anxious or stressed.Stress increases muscle tension and can worsen back pain.It is important to recognize when you are anxious or stressed and learn ways to manage it.Exercise is a great option. SEEK MEDICAL CARE IF:  You have pain that is not relieved with rest or medicine.  You have pain that does not improve in 1 week.  You have new symptoms.  You are generally not feeling well. SEEK   IMMEDIATE MEDICAL CARE IF:   You have pain that radiates from your back into your legs.  You develop new bowel or bladder control problems.  You have unusual weakness or numbness in your arms or legs.  You develop nausea or vomiting.  You develop abdominal pain.  You feel faint. Document Released: 08/04/2005 Document Revised: 02/03/2012 Document Reviewed: 12/23/2010 [employer] Patient Information 2014 [employer].  
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
## 2013-12-07 NOTE — ED Provider Notes (Signed)
Medical screening examination/treatment/procedure(s) were performed by non-physician practitioner and as supervising physician I was immediately available for consultation/collaboration.   EKG Interpretation None        Layla MawKristen N Dalaina Tates, DO 12/07/13 1553
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
## 2014-02-16 ENCOUNTER — Ambulatory Visit (INDEPENDENT_AMBULATORY_CARE_PROVIDER_SITE_OTHER): Payer: BC Managed Care – PPO | Admitting: Emergency Medicine

## 2014-02-16 ENCOUNTER — Encounter: Payer: Self-pay | Admitting: Emergency Medicine

## 2014-02-16 VITALS — BP 132/80 | HR 84 | Temp 98.2°F | Resp 18 | Ht 68.5 in | Wt 297.0 lb

## 2014-02-16 DIAGNOSIS — I10 Essential (primary) hypertension: Secondary | ICD-10-CM

## 2014-02-16 DIAGNOSIS — E119 Type 2 diabetes mellitus without complications: Secondary | ICD-10-CM

## 2014-02-16 DIAGNOSIS — Z0000 Encounter for general adult medical examination without abnormal findings: Secondary | ICD-10-CM

## 2014-02-16 DIAGNOSIS — E782 Mixed hyperlipidemia: Secondary | ICD-10-CM

## 2014-02-16 DIAGNOSIS — Z125 Encounter for screening for malignant neoplasm of prostate: Secondary | ICD-10-CM

## 2014-02-16 DIAGNOSIS — R6889 Other general symptoms and signs: Secondary | ICD-10-CM

## 2014-02-16 DIAGNOSIS — E669 Obesity, unspecified: Secondary | ICD-10-CM

## 2014-02-16 LAB — CBC WITH DIFFERENTIAL/PLATELET
Basophils Absolute: 0.1 10*3/uL (ref 0.0–0.1)
Basophils Relative: 1 % (ref 0–1)
Eosinophils Absolute: 0.2 10*3/uL (ref 0.0–0.7)
Eosinophils Relative: 3 % (ref 0–5)
HCT: 41.9 % (ref 39.0–52.0)
Hemoglobin: 13.8 g/dL (ref 13.0–17.0)
Lymphocytes Relative: 43 % (ref 12–46)
Lymphs Abs: 2.4 10*3/uL (ref 0.7–4.0)
MCH: 26.1 pg (ref 26.0–34.0)
MCHC: 32.9 g/dL (ref 30.0–36.0)
MCV: 79.4 fL (ref 78.0–100.0)
Monocytes Absolute: 0.6 10*3/uL (ref 0.1–1.0)
Monocytes Relative: 10 % (ref 3–12)
Neutro Abs: 2.4 10*3/uL (ref 1.7–7.7)
Neutrophils Relative %: 43 % (ref 43–77)
Platelets: 306 10*3/uL (ref 150–400)
RBC: 5.28 MIL/uL (ref 4.22–5.81)
RDW: 14.7 % (ref 11.5–15.5)
WBC: 5.6 10*3/uL (ref 4.0–10.5)

## 2014-02-16 NOTE — Patient Instructions (Signed)
Diabetes and Foot Care Diabetes may cause you to have problems because of poor blood supply (circulation) to your feet and legs. This may cause the skin on your feet to become thinner, break easier, and heal more slowly. Your skin may become dry, and the skin may peel and crack. You may also have nerve damage in your legs and feet causing decreased feeling in them. You may not notice minor injuries to your feet that could lead to infections or more serious problems. Taking care of your feet is one of the most important things you can do for yourself.  HOME CARE INSTRUCTIONS  Wear shoes at all times, even in the house. Do not go barefoot. Bare feet are easily injured.  Check your feet daily for blisters, cuts, and redness. If you cannot see the bottom of your feet, use a mirror or ask someone for help.  Wash your feet with warm water (do not use hot water) and mild soap. Then pat your feet and the areas between your toes until they are completely dry. Do not soak your feet as this can dry your skin.  Apply a moisturizing lotion or petroleum jelly (that does not contain alcohol and is unscented) to the skin on your feet and to dry, brittle toenails. Do not apply lotion between your toes.  Trim your toenails straight across. Do not dig under them or around the cuticle. File the edges of your nails with an emery board or nail file.  Do not cut corns or calluses or try to remove them with medicine.  Wear clean socks or stockings every day. Make sure they are not too tight. Do not wear knee-high stockings since they may decrease blood flow to your legs.  Wear shoes that fit properly and have enough cushioning. To break in new shoes, wear them for just a few hours a day. This prevents you from injuring your feet. Always look in your shoes before you put them on to be sure there are no objects inside.  Do not cross your legs. This may decrease the blood flow to your feet.  If you find a minor scrape,  cut, or break in the skin on your feet, keep it and the skin around it clean and dry. These areas may be cleansed with mild soap and water. Do not cleanse the area with peroxide, alcohol, or iodine.  When you remove an adhesive bandage, be sure not to damage the skin around it.  If you have a wound, look at it several times a day to make sure it is healing.  Do not use heating pads or hot water bottles. They may burn your skin. If you have lost feeling in your feet or legs, you may not know it is happening until it is too late.  Make sure your health care provider performs a complete foot exam at least annually or more often if you have foot problems. Report any cuts, sores, or bruises to your health care provider immediately. SEEK MEDICAL CARE IF:   You have an injury that is not healing.  You have cuts or breaks in the skin.  You have an ingrown nail.  You notice redness on your legs or feet.  You feel burning or tingling in your legs or feet.  You have pain or cramps in your legs and feet.  Your legs or feet are numb.  Your feet always feel cold. SEEK IMMEDIATE MEDICAL CARE IF:   There is increasing redness,   swelling, or pain in or around a wound.  There is a red line that goes up your leg.  Pus is coming from a wound.  You develop a fever or as directed by your health care provider.  You notice a bad smell coming from an ulcer or wound. Document Released: 08/01/2000 Document Revised: 04/06/2013 Document Reviewed: 01/11/2013 [employer] Patient Information 2015 [employer]. This information is not intended to replace advice given to you by your health care provider. Make sure you discuss any questions you have with your health care provider.  

## 2014-02-16 NOTE — Progress Notes (Signed)
Subjective:    Patient ID: [name] [name], male    DOB: 03/08/1956, 58 y.o.   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 deficient . He is down #17 since last CPE. He is using the bike for exercise. He is trying to improve his diet. He notes mild fatigue but resolves with rest.  He notes BS/ BP good at home. He checks feet routinely and denies skin break down or neuropathy increase. He has been off insulin for several months.  He did note some blisters on right foot a couple of weeks ago are healing currently.He notes ED medicines help significantly but cannot afford RX.  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 He has been released from Ortho/ Neuro.   WBC             6.1   09/26/2013 HGB            14.4   09/26/2013 HCT            45.2   09/26/2013 PLT             313   09/26/2013 GLUCOSE          96   10/20/2013 CHOL            155   10/20/2013 TRIG             67   10/20/2013 HDL              37   10/20/2013 LDLCALC         105   10/20/2013 ALT               9   10/20/2013 AST              19   10/20/2013 NA              138   10/20/2013 K               4.4   10/20/2013 CL              102   10/20/2013 CREATININE     1.04   10/20/2013 BUN              22   10/20/2013 CO2              28   10/20/2013 TSH           0.877   09/29/2012 HGBA1C          6.9   09/26/2013   Hyperlipidemia  Diabetes  Hypertension      Medication List       This list is accurate as of: 02/16/14 11:59 PM.  Always use your most recent med list.               Dapagliflozin Propanediol 10 MG Tabs  Commonly known as:  FARXIGA  Take 10 mg by mouth daily.     diltiazem 120 MG 24 hr capsule  Commonly known as:  CARDIZEM CD  Take 1 capsule (120 mg total) by mouth daily.     fish oil-omega-3 fatty acids 1000 MG capsule  Take 1,400 mg by mouth daily.     glipiZIDE 5 MG tablet  Commonly known as:  GLUCOTROL  Take 5 mg by mouth as needed. Patient  takes when blood sugar is over 160  losartan 100 MG tablet  Commonly known as:  COZAAR  Take 1 tablet (100 mg total) by mouth daily.     MAGNESIUM-CHELATED ZINC PO  Take 400 mg by mouth daily.     metFORMIN 500 MG (MOD) 24 hr tablet  Commonly known as:  GLUMETZA  Take 1,000 mg by mouth 2 (two) times daily with a meal.     sildenafil 100 MG tablet  Commonly known as:  VIAGRA  Take 1 tablet (100 mg total) by mouth as needed for erectile dysfunction.     simvastatin 40 MG tablet  Commonly known as:  ZOCOR  Take 1 tablet (40 mg total) by mouth every evening.     THERAGRAN-M PREMIER 50 PLUS PO  Take 1 tablet by mouth daily.     Vitamin D-3 5000 UNITS Tabs  Take 1 tablet by mouth daily.       No Known Allergies  Past Medical History  Diagnosis Date  . Hypertension   . Seasonal allergies   . Hyperlipidemia   . Diabetes mellitus without complication   . Vitamin D deficiency   . Obesity    Past Surgical History  Procedure Laterality Date  . Carpal tunnel release  2008    bilateral  . Knee arthroscopy  2010    left  . Cervical discectomy  2003    History  Substance Use Topics  . Smoking status: Former Smoker    Quit date: 02/25/1991  . Smokeless tobacco: Never Used  . Alcohol Use: No   Family History  Problem Relation Age of Onset  . Colon cancer Neg Hx   . Stomach cancer Neg Hx   . Diabetes Father   . Hypertension Father   . Diabetes Sister   . Kidney disease Sister   . Heart disease Mother   . Stroke Mother   . Diabetes Mother     MAINTENANCE: Colonoscopy:03/10/12 ZOX:WRUE due Dentist: overdue  IMMUNIZATIONS: Tdap:2012 Pneumovax:2014 Zostavax: Influenza:   Patient Care Team: Lucky Cowboy, MD as PCP - General (Internal Medicine) Mardella Layman, MD as Consulting Physician (Gastroenterology) Delon Sacramento, MD as Consulting Physician (Ophthalmology) Nilda Simmer, MD as Consulting Physician (Orthopedic Surgery) Wyn Forster, MD  as Consulting Physician (Orthopedic Surgery) Karn Cassis, MD as Consulting Physician (Neurosurgery)   Review of Systems BP 132/80  Pulse 84  Temp(Src) 98.2 F (36.8 C) (Temporal)  Resp 18  Ht 5' 8.5" (1.74 m)  Wt 297 lb (134.718 kg)  BMI 44.50 kg/m2     Objective:   Physical Exam  Nursing note and vitals reviewed. Constitutional: He is oriented to person, place, and time. He appears well-developed and well-nourished.  obese  HENT:  Head: Normocephalic and atraumatic.  Right Ear: External ear normal.  Left Ear: External ear normal.  Nose: Nose normal.  Large tongue obstructs airway  Eyes: Conjunctivae and EOM are normal. Pupils are equal, round, and reactive to light. Right eye exhibits no discharge. Left eye exhibits no discharge. No scleral icterus.  Neck: Normal range of motion. Neck supple. No JVD present. No tracheal deviation present. No thyromegaly present.  Cardiovascular: Normal rate, regular rhythm, normal heart sounds and intact distal pulses.   Pulmonary/Chest: Effort normal and breath sounds normal.  Abdominal: Soft. Bowel sounds are normal. He exhibits no distension and no mass. There is no tenderness. There is no rebound and no guarding.  Genitourinary:  DEF 2016  Musculoskeletal: Normal range of motion. He exhibits no edema and  no tenderness.  Lymphadenopathy:    He has no cervical adenopathy.  Neurological: He is alert and oriented to person, place, and time. He has normal reflexes. No cranial nerve deficit. He exhibits normal muscle tone. Coordination normal.  Skin: Skin is warm and dry. No rash noted. No erythema. No pallor.  Scaling both feet with right foot with old healing ulceration  Psychiatric: He has a normal mood and affect. His behavior is normal. Judgment and thought content normal.     Aorta SCAN ABnormal EKG NSCSPT WNL     Assessment & Plan:  1. CPE- Update screening labs/ History/ Immunizations/ Testing as needed. Advised healthy diet,  QD exercise, increase H20 and continue RX/ Vitamins AD.  ADvised EYE EXAM ASAP 2. ED- Advised continue to lose weight and increase cardio, checkblabs. SX Levitra 20 3 boxes/ cialis 20 mg 1 box given, use AD and not together.  3. Abnormal aorta scan- Abdominal US to evaluate  4.  3 month F/U for Obesity, HTN, Cholesterol,DM, D. Deficient. Needs healthy diet, cardio QD and obtain healthy weight. Check Labs, Check BP if >130/80 call office, Check BS if >200 call office   5. Callus/ dry skin DM- Advised needs podiatry evaluation with  DM Hx, epsom salt soaks, vaseline treatment QD, monitor QD and call with any concerns/ adverse changes   6. Obesity- Continue weight loss, increase activity and better diet. Pt aware of risks. Check labs Get sleep study

## 2014-02-17 ENCOUNTER — Other Ambulatory Visit: Payer: Self-pay | Admitting: Emergency Medicine

## 2014-02-17 LAB — BASIC METABOLIC PANEL WITH GFR
BUN: 17 mg/dL (ref 6–23)
CO2: 24 mEq/L (ref 19–32)
Calcium: 9.6 mg/dL (ref 8.4–10.5)
Chloride: 105 mEq/L (ref 96–112)
Creat: 1.06 mg/dL (ref 0.50–1.35)
GFR, Est African American: 89 mL/min
GFR, Est Non African American: 77 mL/min
Glucose, Bld: 85 mg/dL (ref 70–99)
Potassium: 4.4 mEq/L (ref 3.5–5.3)
Sodium: 140 mEq/L (ref 135–145)

## 2014-02-17 LAB — URINALYSIS, MICROSCOPIC ONLY
Bacteria, UA: NONE SEEN
Casts: NONE SEEN
Crystals: NONE SEEN
Squamous Epithelial / LPF: NONE SEEN

## 2014-02-17 LAB — INSULIN, FASTING: Insulin fasting, serum: 27 u[IU]/mL (ref 3–28)

## 2014-02-17 LAB — URINALYSIS, ROUTINE W REFLEX MICROSCOPIC
Bilirubin Urine: NEGATIVE
Glucose, UA: >1000 mg/dL — AB
Hgb urine dipstick: NEGATIVE
Ketones, ur: NEGATIVE mg/dL
Nitrite: NEGATIVE
Protein, ur: NEGATIVE mg/dL
Specific Gravity, Urine: 1.025 (ref 1.005–1.030)
Urobilinogen, UA: 1 mg/dL (ref 0.0–1.0)
pH: 6 (ref 5.0–8.0)

## 2014-02-17 LAB — MICROALBUMIN / CREATININE URINE RATIO
Creatinine, Urine: 137.6 mg/dL
Microalb Creat Ratio: 20.2 mg/g (ref 0.0–30.0)
Microalb, Ur: 2.78 mg/dL — ABNORMAL HIGH (ref 0.00–1.89)

## 2014-02-17 LAB — TSH: TSH: 1.767 u[IU]/mL (ref 0.350–4.500)

## 2014-02-17 LAB — TESTOSTERONE: Testosterone: 188 ng/dL — ABNORMAL LOW (ref 300–890)

## 2014-02-17 LAB — HEPATIC FUNCTION PANEL
ALT: 9 U/L (ref 0–53)
AST: 18 U/L (ref 0–37)
Albumin: 4.3 g/dL (ref 3.5–5.2)
Alkaline Phosphatase: 64 U/L (ref 39–117)
Bilirubin, Direct: 0.1 mg/dL (ref 0.0–0.3)
Indirect Bilirubin: 0.3 mg/dL (ref 0.2–1.2)
Total Bilirubin: 0.4 mg/dL (ref 0.2–1.2)
Total Protein: 7.7 g/dL (ref 6.0–8.3)

## 2014-02-17 LAB — LIPID PANEL
Cholesterol: 142 mg/dL (ref 0–200)
HDL: 40 mg/dL (ref >39)
LDL Cholesterol: 85 mg/dL (ref 0–99)
Total CHOL/HDL Ratio: 3.6 Ratio
Triglycerides: 86 mg/dL (ref <150)
VLDL: 17 mg/dL (ref 0–40)

## 2014-02-17 LAB — HEMOGLOBIN A1C
Hgb A1c MFr Bld: 6.7 % — ABNORMAL HIGH (ref <5.7)
Mean Plasma Glucose: 146 mg/dL — ABNORMAL HIGH (ref <117)

## 2014-02-17 LAB — PSA: PSA: 0.6 ng/mL (ref <=4.00)

## 2014-02-17 LAB — MAGNESIUM: Magnesium: 2 mg/dL (ref 1.5–2.5)

## 2014-02-17 LAB — VITAMIN D 25 HYDROXY (VIT D DEFICIENCY, FRACTURES): Vit D, 25-Hydroxy: 71 ng/mL (ref 30–89)

## 2014-02-17 MED ORDER — CIPROFLOXACIN HCL 500 MG PO TABS: 500.0000 mg | ORAL_TABLET | Freq: Two times a day (BID) | ORAL | Status: AC

## 2014-02-23 ENCOUNTER — Ambulatory Visit
Admission: RE | Admit: 2014-02-23 | Discharge: 2014-02-23 | Disposition: A | Discharge status: Home / Self Care | Payer: BC Managed Care – PPO | Source: Ambulatory Visit | Attending: Emergency Medicine | Admitting: Emergency Medicine

## 2014-02-23 ENCOUNTER — Other Ambulatory Visit: Payer: Self-pay | Admitting: Emergency Medicine

## 2014-02-23 DIAGNOSIS — R6889 Other general symptoms and signs: Secondary | ICD-10-CM

## 2014-02-24 ENCOUNTER — Other Ambulatory Visit: Payer: Self-pay | Admitting: Emergency Medicine

## 2014-02-24 DIAGNOSIS — R6889 Other general symptoms and signs: Secondary | ICD-10-CM

## 2014-03-06 ENCOUNTER — Encounter: Payer: Self-pay | Admitting: Podiatry

## 2014-03-06 ENCOUNTER — Ambulatory Visit (INDEPENDENT_AMBULATORY_CARE_PROVIDER_SITE_OTHER): Payer: BC Managed Care – PPO | Admitting: Podiatry

## 2014-03-06 VITALS — BP 153/83 | HR 92 | Resp 17 | Ht 68.5 in | Wt 285.0 lb

## 2014-03-06 DIAGNOSIS — B353 Tinea pedis: Secondary | ICD-10-CM

## 2014-03-06 NOTE — Patient Instructions (Signed)
Continue  to apply Tinactin on to the right foot daily x14 days\  Diabetes and Foot Care Diabetes may cause you to have problems because of poor blood supply (circulation) to your feet and legs. This may cause the skin on your feet to become thinner, break easier, and heal more slowly. Your skin may become dry, and the skin may peel and crack. You may also have nerve damage in your legs and feet causing decreased feeling in them. You may not notice minor injuries to your feet that could lead to infections or more serious problems. Taking care of your feet is one of the most important things you can do for yourself.  HOME CARE INSTRUCTIONS  Wear shoes at all times, even in the house. Do not go barefoot. Bare feet are easily injured.  Check your feet daily for blisters, cuts, and redness. If you cannot see the bottom of your feet, use a mirror or ask someone for help.  Wash your feet with warm water (do not use hot water) and mild soap. Then pat your feet and the areas between your toes until they are completely dry. Do not soak your feet as this can dry your skin.  Apply a moisturizing lotion or petroleum jelly (that does not contain alcohol and is unscented) to the skin on your feet and to dry, brittle toenails. Do not apply lotion between your toes.  Trim your toenails straight across. Do not dig under them or around the cuticle. File the edges of your nails with an emery board or nail file.  Do not cut corns or calluses or try to remove them with medicine.  Wear clean socks or stockings every day. Make sure they are not too tight. Do not wear knee-high stockings since they may decrease blood flow to your legs.  Wear shoes that fit properly and have enough cushioning. To break in new shoes, wear them for just a few hours a day. This prevents you from injuring your feet. Always look in your shoes before you put them on to be sure there are no objects inside.  Do not cross your legs. This may  decrease the blood flow to your feet.  If you find a minor scrape, cut, or break in the skin on your feet, keep it and the skin around it clean and dry. These areas may be cleansed with mild soap and water. Do not cleanse the area with peroxide, alcohol, or iodine.  When you remove an adhesive bandage, be sure not to damage the skin around it.  If you have a wound, look at it several times a day to make sure it is healing.  Do not use heating pads or hot water bottles. They may burn your skin. If you have lost feeling in your feet or legs, you may not know it is happening until it is too late.  Make sure your health care provider performs a complete foot exam at least annually or more often if you have foot problems. Report any cuts, sores, or bruises to your health care provider immediately. SEEK MEDICAL CARE IF:   You have an injury that is not healing.  You have cuts or breaks in the skin.  You have an ingrown nail.  You notice redness on your legs or feet.  You feel burning or tingling in your legs or feet.  You have pain or cramps in your legs and feet.  Your legs or feet are numb.  Your feet  always feel cold. SEEK IMMEDIATE MEDICAL CARE IF:   There is increasing redness, swelling, or pain in or around a wound.  There is a red line that goes up your leg.  Pus is coming from a wound.  You develop a fever or as directed by your health care provider.  You notice a bad smell coming from an ulcer or wound. Document Released: 08/01/2000 Document Revised: 04/06/2013 Document Reviewed: 01/11/2013 Exit[employer] Patient Information 2015 [employer]. This information is not intended to replace advice given to you by your health care provider. Make sure you discuss any questions you have with your health care provider.  

## 2014-03-06 NOTE — Progress Notes (Signed)
   Subjective:    Patient ID: [name] [name], male    DOB: 08/07/1956, 58 y.o.   MRN: [id number]  HPI Comments: N skin problem, diabetes L right medial midfoot, history of similar symptoms on the left D and O couple weeks C blistered skin, itching - history, darkened, peeling rash - present A diabetes, pt states may have been caused by bleaching his socks T [employer] Tinactin spray, Loree FeeMelissa Smith, PA from Dr Barron AlvineMcKeowan's office referred.   Diabetes   after application of generic Tinactin x2 weeks the skin blistering and itching has improved.    Review of Systems  All other systems reviewed and are negative.      Objective:   Physical Exam  Orientated x3 black male  Vascular: DP and PT pulses 2/4 bilaterally  Neurological: Sensation to 10 g monofilament wire intact 5/5 bilaterally Vibratory sensation intact bilaterally Ankle reflexes equal and reactive bilaterally  Dermatological: The right mid arch and plantar medial first MPJ skin area demonstrates dry slightly discolored skin in areas that patient described prior if she blistering that improve with Tinactin spray  Musculoskeletal: Pes planus bilaterally No restriction ankle, subtalar, midtarsal joints bilaterally     Assessment & Plan:   Assessment: Satisfactory neurovascular status Improving tinea pedis right  Plan: Advised patient to continue applying Tinactin spray an additional 14 days to the scaling areas on the right foot.  Reappoint at yearly intervals or at patient request

## 2014-03-07 ENCOUNTER — Encounter: Payer: Self-pay | Admitting: Podiatry

## 2014-03-19 ENCOUNTER — Other Ambulatory Visit: Payer: Self-pay | Admitting: Emergency Medicine

## 2014-03-20 ENCOUNTER — Other Ambulatory Visit: Payer: Self-pay | Admitting: Emergency Medicine

## 2014-03-20 DIAGNOSIS — N390 Urinary tract infection, site not specified: Secondary | ICD-10-CM

## 2014-03-21 ENCOUNTER — Other Ambulatory Visit: Payer: BC Managed Care – PPO

## 2014-03-21 DIAGNOSIS — N390 Urinary tract infection, site not specified: Secondary | ICD-10-CM

## 2014-03-22 LAB — URINALYSIS, ROUTINE W REFLEX MICROSCOPIC
Bilirubin Urine: NEGATIVE
Glucose, UA: NEGATIVE mg/dL
Hgb urine dipstick: NEGATIVE
Ketones, ur: NEGATIVE mg/dL
Leukocytes, UA: NEGATIVE
Nitrite: NEGATIVE
Protein, ur: NEGATIVE mg/dL
Specific Gravity, Urine: 1.027 (ref 1.005–1.030)
Urobilinogen, UA: 0.2 mg/dL (ref 0.0–1.0)
pH: 5.5 (ref 5.0–8.0)

## 2014-03-22 LAB — URINE CULTURE
Colony Count: NO GROWTH
Organism ID, Bacteria: NO GROWTH

## 2014-03-31 ENCOUNTER — Encounter: Payer: Self-pay | Admitting: Surgery

## 2014-04-03 ENCOUNTER — Ambulatory Visit: Payer: BC Managed Care – PPO | Admitting: Cardiology

## 2014-04-03 ENCOUNTER — Encounter: Payer: BC Managed Care – PPO | Admitting: Surgery

## 2014-04-04 ENCOUNTER — Encounter: Payer: Self-pay | Admitting: Cardiology

## 2014-04-07 ENCOUNTER — Telehealth: Payer: Self-pay | Admitting: *Deleted

## 2014-04-07 NOTE — Telephone Encounter (Signed)
[name] [name] refused an appointment because of his co-pay.

## 2014-04-11 ENCOUNTER — Encounter (HOSPITAL_BASED_OUTPATIENT_CLINIC_OR_DEPARTMENT_OTHER): Payer: BC Managed Care – PPO

## 2014-05-08 ENCOUNTER — Institutional Professional Consult (permissible substitution): Payer: BC Managed Care – PPO | Admitting: Cardiology

## 2014-06-06 ENCOUNTER — Ambulatory Visit (INDEPENDENT_AMBULATORY_CARE_PROVIDER_SITE_OTHER): Payer: BC Managed Care – PPO | Admitting: Internal Medicine

## 2014-06-06 ENCOUNTER — Encounter: Payer: Self-pay | Admitting: Internal Medicine

## 2014-06-06 VITALS — BP 120/82 | HR 76 | Temp 97.9°F | Resp 16 | Ht 68.5 in | Wt 295.2 lb

## 2014-06-06 DIAGNOSIS — E559 Vitamin D deficiency, unspecified: Secondary | ICD-10-CM

## 2014-06-06 DIAGNOSIS — I10 Essential (primary) hypertension: Secondary | ICD-10-CM

## 2014-06-06 DIAGNOSIS — E1122 Type 2 diabetes mellitus with diabetic chronic kidney disease: Secondary | ICD-10-CM

## 2014-06-06 DIAGNOSIS — E785 Hyperlipidemia, unspecified: Secondary | ICD-10-CM

## 2014-06-06 DIAGNOSIS — N189 Chronic kidney disease, unspecified: Secondary | ICD-10-CM

## 2014-06-06 DIAGNOSIS — E1129 Type 2 diabetes mellitus with other diabetic kidney complication: Secondary | ICD-10-CM | POA: Insufficient documentation

## 2014-06-06 DIAGNOSIS — Z79899 Other long term (current) drug therapy: Secondary | ICD-10-CM | POA: Insufficient documentation

## 2014-06-06 LAB — CBC WITH DIFFERENTIAL/PLATELET
Basophils Absolute: 0.1 10*3/uL (ref 0.0–0.1)
Basophils Relative: 1 % (ref 0–1)
Eosinophils Absolute: 0.2 10*3/uL (ref 0.0–0.7)
Eosinophils Relative: 3 % (ref 0–5)
HCT: 43.7 % (ref 39.0–52.0)
Hemoglobin: 14.1 g/dL (ref 13.0–17.0)
Lymphocytes Relative: 40 % (ref 12–46)
Lymphs Abs: 2.3 10*3/uL (ref 0.7–4.0)
MCH: 26.1 pg (ref 26.0–34.0)
MCHC: 32.3 g/dL (ref 30.0–36.0)
MCV: 80.9 fL (ref 78.0–100.0)
Monocytes Absolute: 0.6 10*3/uL (ref 0.1–1.0)
Monocytes Relative: 10 % (ref 3–12)
Neutro Abs: 2.6 10*3/uL (ref 1.7–7.7)
Neutrophils Relative %: 46 % (ref 43–77)
Platelets: 317 10*3/uL (ref 150–400)
RBC: 5.4 MIL/uL (ref 4.22–5.81)
RDW: 14.3 % (ref 11.5–15.5)
WBC: 5.7 10*3/uL (ref 4.0–10.5)

## 2014-06-06 LAB — HEMOGLOBIN A1C
Hgb A1c MFr Bld: 6.1 % — ABNORMAL HIGH (ref <5.7)
Mean Plasma Glucose: 128 mg/dL — ABNORMAL HIGH (ref <117)

## 2014-06-06 MED ORDER — LOSARTAN POTASSIUM 100 MG PO TABS: ORAL_TABLET | ORAL | Status: DC

## 2014-06-06 NOTE — Patient Instructions (Signed)

## 2014-06-06 NOTE — Progress Notes (Signed)
Patient ID: [name] [name], male   DOB: 04/17/1956, 58 y.o.   MRN: [id number]   This very nice 58 y.o.SBM presents for 3 month follow up with Hypertension, Hyperlipidemia, T2_NIDDM w/ CKD2 and Vitamin D Deficiency.    Patient is treated for HTN since 2007 & BP has been controlled at home. Today's BP: 120/82 mmHg. Patient has had no complaints of any cardiac type chest pain, palpitations, dyspnea/orthopnea/PND, dizziness, claudication, or dependent edema.   Hyperlipidemia is controlled with diet & meds. Patient denies myalgias or other med SE's. Last Lipids were 02/16/2014: Cholesterol, Total 142; HDL Cholesterol by NMR 40; LDL (calc) 85; Triglycerides 86   Also, the patient has history of T2_NIDDM diagnosed in 2014 when he was hospitalized with Non ketotic hyperosmolar ketoacidosis and hemiballismus and Glucose > 1000mg %.   Patient also has CKD2 (GFR 77 ml/min)  and has had no symptoms of reactive hypoglycemia, diabetic polys, paresthesias or visual blurring.  CBG's are controlled w/Metformin & Marcelline DeistFarxiga  Are always  less than 120 mg%. Last A1c was 6.7% on 02/16/2014.    Further, the patient also has history of Vitamin D Deficiency and supplements vitamin D without any suspected side-effects. Last vitamin D was 02/16/2014: Vit D, 25-Hydroxy 71   Medication List   diltiazem 120 MG 24 hr capsule  Commonly known as:  CARDIZEM CD  Take 1 capsule (120 mg total) by mouth daily.     FARXIGA 10 MG Tabs  Generic drug:  Dapagliflozin Propanediol  TAKE ONE TABLET BY MOUTH ONCE DAILY     fish oil-omega-3 fatty acids 1000 MG capsule  Take 1,400 mg by mouth daily.     glipiZIDE 5 MG tablet  Commonly known as:  GLUCOTROL  Take 5 mg by mouth as needed. Patient takes when blood sugar is over 160     losartan 100 MG tablet  Commonly known as:  COZAAR     MAGNESIUM-CHELATED ZINC PO  Take 400 mg by mouth daily.     metFORMIN 500 MG tablet  Commonly known as:  GLUCOPHAGE  TAKE TWO TABLETS BY MOUTH TWICE DAILY      sildenafil 100 MG tablet  Commonly known as:  VIAGRA  Take 1 tablet (100 mg total) by mouth as needed for erectile dysfunction.     simvastatin 40 MG tablet  Commonly known as:  ZOCOR  TAKE ONE TABLET BY MOUTH ONCE DAILY IN THE EVENING     THERAGRAN-M PREMIER 50 PLUS PO  Take 1 tablet by mouth daily.     Vitamin D-3 5000 UNITS Tabs  Take 1 tablet by mouth daily.     No Known Allergies  PMHx:   Past Medical History  Diagnosis Date  . Hypertension   . Seasonal allergies   . Hyperlipidemia   . Diabetes mellitus without complication   . Vitamin D deficiency   . Obesity    Immunization History  Administered Date(s) Administered  . Pneumococcal-Unspecified 02/16/2013  . Tdap 02/10/2011   Past Surgical History  Procedure Laterality Date  . Carpal tunnel release  2008    bilateral  . Knee arthroscopy  2010    left  . Cervical discectomy  2003   FHx:    Reviewed / unchanged  SHx:    Reviewed / unchanged  Systems Review:  Constitutional: Denies fever, chills, wt changes, headaches, insomnia, fatigue, night sweats, change in appetite. Eyes: Denies redness, blurred vision, diplopia, discharge, itchy, watery eyes.  ENT: Denies discharge, congestion, post nasal drip,  epistaxis, sore throat, earache, hearing loss, dental pain, tinnitus, vertigo, sinus pain, snoring.  CV: Denies chest pain, palpitations, irregular heartbeat, syncope, dyspnea, diaphoresis, orthopnea, PND, claudication or edema. Respiratory: denies cough, dyspnea, DOE, pleurisy, hoarseness, laryngitis, wheezing.  Gastrointestinal: Denies dysphagia, odynophagia, heartburn, reflux, water brash, abdominal pain or cramps, nausea, vomiting, bloating, diarrhea, constipation, hematemesis, melena, hematochezia  or hemorrhoids. Genitourinary: Denies dysuria, frequency, urgency, nocturia, hesitancy, discharge, hematuria or flank pain. Musculoskeletal: Denies arthralgias, myalgias, stiffness, jt. swelling, pain, limping  or strain/sprain.  Skin: Denies pruritus, rash, hives, warts, acne, eczema or change in skin lesion(s). Neuro: No weakness, tremor, incoordination, spasms, paresthesia or pain. Psychiatric: Denies confusion, memory loss or sensory loss. Endo: Denies change in weight, skin or hair change.  Heme/Lymph: No excessive bleeding, bruising or enlarged lymph nodes.  Exam:  BP 120/82  Pulse 76  Temp(Src) 97.9 F (36.6 C) (Temporal)  Resp 16  Ht 5' 8.5" (1.74 m)  Wt 295 lb 3.2 oz (133.902 kg)  BMI 44.23 kg/m2  Appears well nourished and in no distress. Eyes: PERRLA, EOMs, conjunctiva no swelling or erythema. Sinuses: No frontal/maxillary tenderness ENT/Mouth: EAC's clear, TM's nl w/o erythema, bulging. Nares clear w/o erythema, swelling, exudates. Oropharynx clear without erythema or exudates. Oral hygiene is good. Tongue normal, non obstructing. Hearing intact.  Neck: Supple. Thyroid nl. Car 2+/2+ without bruits, nodes or JVD. Chest: Respirations nl with BS clear & equal w/o rales, rhonchi, wheezing or stridor.  Cor: Heart sounds normal w/ regular rate and rhythm without sig. murmurs, gallops, clicks, or rubs. Peripheral pulses normal and equal  without edema.  Abdomen: Soft & bowel sounds normal. Non-tender w/o guarding, rebound, hernias, masses, or organomegaly.  Lymphatics: Unremarkable.  Musculoskeletal: Full ROM all peripheral extremities, joint stability, 5/5 strength, and normal gait.  Skin: Warm, dry without exposed rashes, lesions or ecchymosis apparent.  Neuro: Cranial nerves intact, reflexes equal bilaterally. Sensory-motor testing grossly intact. Tendon reflexes grossly intact.  Pysch: Alert & oriented x 3.  Insight and judgement nl & appropriate. No ideations.  Assessment and Plan:  1. Hypertension - Continue monitor blood pressure at home. Continue diet/meds same.  2. Hyperlipidemia - Continue diet/meds, exercise,& lifestyle modifications. Continue monitor periodic  cholesterol/liver & renal functions   3. T2_NIDDM w/CKD2 - Continue diet, exercise, lifestyle modifications. Monitor appropriate labs.  4. Vitamin D Deficiency - Continue supplementation.   Recommended regular exercise, BP monitoring, weight control, and discussed med and SE's. Recommended labs to assess and monitor clinical status. Further disposition pending results of labs.

## 2014-06-07 ENCOUNTER — Other Ambulatory Visit: Payer: Self-pay | Admitting: Internal Medicine

## 2014-06-07 LAB — HEPATIC FUNCTION PANEL
ALT: 9 U/L (ref 0–53)
AST: 17 U/L (ref 0–37)
Albumin: 4.5 g/dL (ref 3.5–5.2)
Alkaline Phosphatase: 62 U/L (ref 39–117)
Bilirubin, Direct: 0.1 mg/dL (ref 0.0–0.3)
Indirect Bilirubin: 0.3 mg/dL (ref 0.2–1.2)
Total Bilirubin: 0.4 mg/dL (ref 0.2–1.2)
Total Protein: 7.9 g/dL (ref 6.0–8.3)

## 2014-06-07 LAB — LIPID PANEL
Cholesterol: 131 mg/dL (ref 0–200)
HDL: 41 mg/dL (ref >39)
LDL Cholesterol: 67 mg/dL (ref 0–99)
Total CHOL/HDL Ratio: 3.2 Ratio
Triglycerides: 114 mg/dL (ref <150)
VLDL: 23 mg/dL (ref 0–40)

## 2014-06-07 LAB — BASIC METABOLIC PANEL WITH GFR
BUN: 18 mg/dL (ref 6–23)
CO2: 26 mEq/L (ref 19–32)
Calcium: 9.6 mg/dL (ref 8.4–10.5)
Chloride: 103 mEq/L (ref 96–112)
Creat: 1.1 mg/dL (ref 0.50–1.35)
GFR, Est African American: 85 mL/min
GFR, Est Non African American: 74 mL/min
Glucose, Bld: 91 mg/dL (ref 70–99)
Potassium: 4.4 mEq/L (ref 3.5–5.3)
Sodium: 140 mEq/L (ref 135–145)

## 2014-06-07 LAB — INSULIN, FASTING: Insulin fasting, serum: 25.3 u[IU]/mL — ABNORMAL HIGH (ref 2.0–19.6)

## 2014-06-07 LAB — TSH: TSH: 1.851 u[IU]/mL (ref 0.350–4.500)

## 2014-06-07 LAB — MAGNESIUM: Magnesium: 1.7 mg/dL (ref 1.5–2.5)

## 2014-06-07 LAB — VITAMIN D 25 HYDROXY (VIT D DEFICIENCY, FRACTURES): Vit D, 25-Hydroxy: 78 ng/mL (ref 30–89)

## 2014-06-07 MED ORDER — DILTIAZEM HCL ER COATED BEADS 120 MG PO CP24: ORAL_CAPSULE | ORAL | Status: DC

## 2014-06-07 MED ORDER — METFORMIN HCL ER 500 MG PO TB24: ORAL_TABLET | ORAL | Status: DC

## 2014-06-07 MED ORDER — DAPAGLIFLOZIN PROPANEDIOL 10 MG PO TABS: ORAL_TABLET | ORAL | Status: AC

## 2014-06-07 MED ORDER — LOSARTAN POTASSIUM 100 MG PO TABS: ORAL_TABLET | ORAL | Status: DC

## 2014-06-13 ENCOUNTER — Other Ambulatory Visit: Payer: Self-pay | Admitting: *Deleted

## 2014-06-13 MED ORDER — SIMVASTATIN 40 MG PO TABS: ORAL_TABLET | ORAL | Status: DC

## 2014-10-05 ENCOUNTER — Encounter: Payer: Self-pay | Admitting: Physician Assistant

## 2014-10-05 ENCOUNTER — Ambulatory Visit (INDEPENDENT_AMBULATORY_CARE_PROVIDER_SITE_OTHER): Payer: BLUE CROSS/BLUE SHIELD | Admitting: Physician Assistant

## 2014-10-05 VITALS — BP 142/82 | HR 80 | Temp 97.9°F | Resp 16 | Ht 68.5 in | Wt 301.0 lb

## 2014-10-05 DIAGNOSIS — Z79899 Other long term (current) drug therapy: Secondary | ICD-10-CM

## 2014-10-05 DIAGNOSIS — E1122 Type 2 diabetes mellitus with diabetic chronic kidney disease: Secondary | ICD-10-CM

## 2014-10-05 DIAGNOSIS — E669 Obesity, unspecified: Secondary | ICD-10-CM

## 2014-10-05 DIAGNOSIS — E785 Hyperlipidemia, unspecified: Secondary | ICD-10-CM

## 2014-10-05 DIAGNOSIS — I10 Essential (primary) hypertension: Secondary | ICD-10-CM

## 2014-10-05 DIAGNOSIS — E559 Vitamin D deficiency, unspecified: Secondary | ICD-10-CM

## 2014-10-05 DIAGNOSIS — N189 Chronic kidney disease, unspecified: Secondary | ICD-10-CM

## 2014-10-05 LAB — BASIC METABOLIC PANEL WITH GFR
BUN: 28 mg/dL — ABNORMAL HIGH (ref 6–23)
CO2: 25 mEq/L (ref 19–32)
Calcium: 9.6 mg/dL (ref 8.4–10.5)
Chloride: 105 mEq/L (ref 96–112)
Creat: 1.45 mg/dL — ABNORMAL HIGH (ref 0.50–1.35)
GFR, Est African American: 61 mL/min
GFR, Est Non African American: 53 mL/min — ABNORMAL LOW
Glucose, Bld: 96 mg/dL (ref 70–99)
Potassium: 4.4 mEq/L (ref 3.5–5.3)
Sodium: 139 mEq/L (ref 135–145)

## 2014-10-05 LAB — HEPATIC FUNCTION PANEL
ALT: 9 U/L (ref 0–53)
AST: 17 U/L (ref 0–37)
Albumin: 4.3 g/dL (ref 3.5–5.2)
Alkaline Phosphatase: 66 U/L (ref 39–117)
Bilirubin, Direct: 0.1 mg/dL (ref 0.0–0.3)
Indirect Bilirubin: 0.3 mg/dL (ref 0.2–1.2)
Total Bilirubin: 0.4 mg/dL (ref 0.2–1.2)
Total Protein: 8 g/dL (ref 6.0–8.3)

## 2014-10-05 LAB — CBC WITH DIFFERENTIAL/PLATELET
Basophils Absolute: 0.1 10*3/uL (ref 0.0–0.1)
Basophils Relative: 1 % (ref 0–1)
Eosinophils Absolute: 0.2 10*3/uL (ref 0.0–0.7)
Eosinophils Relative: 4 % (ref 0–5)
HCT: 44.5 % (ref 39.0–52.0)
Hemoglobin: 14.5 g/dL (ref 13.0–17.0)
Lymphocytes Relative: 40 % (ref 12–46)
Lymphs Abs: 2.4 10*3/uL (ref 0.7–4.0)
MCH: 26.5 pg (ref 26.0–34.0)
MCHC: 32.6 g/dL (ref 30.0–36.0)
MCV: 81.4 fL (ref 78.0–100.0)
MPV: 9.1 fL (ref 8.6–12.4)
Monocytes Absolute: 0.8 10*3/uL (ref 0.1–1.0)
Monocytes Relative: 13 % — ABNORMAL HIGH (ref 3–12)
Neutro Abs: 2.6 10*3/uL (ref 1.7–7.7)
Neutrophils Relative %: 42 % — ABNORMAL LOW (ref 43–77)
Platelets: 321 10*3/uL (ref 150–400)
RBC: 5.47 MIL/uL (ref 4.22–5.81)
RDW: 14 % (ref 11.5–15.5)
WBC: 6.1 10*3/uL (ref 4.0–10.5)

## 2014-10-05 LAB — LIPID PANEL
Cholesterol: 163 mg/dL (ref 0–200)
HDL: 42 mg/dL (ref >39)
LDL Cholesterol: 87 mg/dL (ref 0–99)
Total CHOL/HDL Ratio: 3.9 Ratio
Triglycerides: 168 mg/dL — ABNORMAL HIGH (ref <150)
VLDL: 34 mg/dL (ref 0–40)

## 2014-10-05 LAB — TSH: TSH: 2.775 u[IU]/mL (ref 0.350–4.500)

## 2014-10-05 LAB — MAGNESIUM: Magnesium: 2 mg/dL (ref 1.5–2.5)

## 2014-10-05 NOTE — Progress Notes (Signed)
Assessment and Plan:  Hypertension: Continue medication, monitor blood pressure at home, call if above 140/90. Continue DASH diet.  Reminder to go to the ER if any CP, SOB, nausea, dizziness, severe HA, changes vision/speech, left arm numbness and tingling and jaw pain. Cholesterol: Continue diet and exercise. Check cholesterol.  Diabetes with diabetic chronic kidney disease and with diabetic polyneuropathy-Continue diet and exercise. Check A1C- will add ASA Vitamin D Def- check level and continue medications.  Obesity with co morbidities- long discussion about weight loss, diet, and exercise  Continue diet and meds as discussed. Further disposition pending results of labs. Discussed med's effects and SE's.    HPI 59 y.o. male  presents for 3 month follow up with hypertension, hyperlipidemia, diabetes and vitamin D.  His blood pressure has been controlled at home, today their BP is BP: (!) 142/82 mmHg.  He does not workout. He denies chest pain, shortness of breath, dizziness.  He is on cholesterol medication, zocor 40 and denies myalgias. His cholesterol is at goal. The cholesterol was:  06/06/2014: Cholesterol, Total 131; HDL Cholesterol by NMR 41; LDL (calc) 67; Triglycerides 114  He has been working on diet and exercise for diabetes with diabetic chronic kidney disease, he is not on bASA, he is on ACE/ARB, he has accu check nano and needs strips and denies  polydipsia, polyuria and visual disturbances. He does complains of bilateral feet numbness and tingling, burning, worse in this right leg.  Last A1C was: 06/06/2014: Hemoglobin-A1c 6.1*  Patient is on Vitamin D supplement. 06/06/2014: Vit D, 25-Hydroxy 78  He has chronic pain bilateral hands due to carpal tunnel and he is on workers comp, seeing pain management next door and getting meds with them.  BMI is Body mass index is 45.1 kg/(m^2)., he is working on diet and exercise. Wt Readings from Last 3 Encounters:  10/05/14 301 lb (136.533  kg)  06/06/14 295 lb 3.2 oz (133.902 kg)  03/06/14 285 lb (129.275 kg)     Current Medications:  Current Outpatient Prescriptions on File Prior to Visit  Medication Sig Dispense Refill  . Cholecalciferol (VITAMIN D-3) 5000 UNITS TABS Take 1 tablet by mouth daily.    . Dapagliflozin Propanediol (FARXIGA) 10 MG TABS Take 1 tablet daily for Diabetes 90 tablet 1  . diltiazem (CARDIZEM CD) 120 MG 24 hr capsule Take 1 capsule daily for BP 90 capsule 1  . fish oil-omega-3 fatty acids 1000 MG capsule Take 1,400 mg by mouth daily.     Marland Kitchen glipiZIDE (GLUCOTROL) 5 MG tablet Take 5 mg by mouth as needed. Patient takes when blood sugar is over 160    . losartan (COZAAR) 100 MG tablet Take 1 tablet daily for BP and Diabetic Kidney Protection. 90 tablet 1  . Magnesium-Zinc (MAGNESIUM-CHELATED ZINC PO) Take 400 mg by mouth daily.     . metFORMIN (GLUCOPHAGE-XR) 500 MG 24 hr tablet Take 2 tablets 2 x day with meals for Diabetes 360 tablet 1  . Multiple Vitamins-Minerals (THERAGRAN-M PREMIER 50 PLUS PO) Take 1 tablet by mouth daily.    . simvastatin (ZOCOR) 40 MG tablet TAKE ONE TABLET BY MOUTH ONCE DAILY IN THE EVENING 90 tablet 1   No current facility-administered medications on file prior to visit.   Medical History:  Past Medical History  Diagnosis Date  . Hypertension   . Seasonal allergies   . Hyperlipidemia   . Diabetes mellitus without complication   . Vitamin D deficiency   . Obesity  Allergies: No Known Allergies   Review of Systems:  Review of Systems  Constitutional: Negative.   HENT: Negative.   Eyes: Negative.   Respiratory: Negative.   Cardiovascular: Negative.   Gastrointestinal: Negative.   Genitourinary: Negative.   Musculoskeletal: Positive for myalgias, back pain and joint pain. Negative for falls and neck pain.  Skin: Negative.   Neurological: Negative.   Endo/Heme/Allergies: Negative.   Psychiatric/Behavioral: Negative.     Family history- Review and  unchanged Social history- Review and unchanged Physical Exam: BP 142/82 mmHg  Pulse 80  Temp(Src) 97.9 F (36.6 C)  Resp 16  Ht 5' 8.5" (1.74 m)  Wt 301 lb (136.533 kg)  BMI 45.10 kg/m2 Wt Readings from Last 3 Encounters:  10/05/14 301 lb (136.533 kg)  06/06/14 295 lb 3.2 oz (133.902 kg)  03/06/14 285 lb (129.275 kg)   General Appearance: Well nourished, in no apparent distress. Eyes: PERRLA, EOMs, conjunctiva no swelling or erythema Sinuses: No Frontal/maxillary tenderness ENT/Mouth: Ext aud canals clear, TMs without erythema, bulging. No erythema, swelling, or exudate on post pharynx.  Tonsils not swollen or erythematous. Hearing normal. Crowded mouth Neck: Supple, thyroid normal.  Respiratory: Respiratory effort normal, BS equal bilaterally without rales, rhonchi, wheezing or stridor.  Cardio: RRR with no MRGs. Brisk peripheral pulses without edema.  Abdomen: Soft, + BS., obese,  Non tender, no guarding, rebound, hernias, masses. Lymphatics: Non tender without lymphadenopathy.  Musculoskeletal: Full ROM, 5/5 strength, Normal gait Skin: Warm, dry without rashes, lesions, ecchymosis.  Neuro: Cranial nerves intact. No cerebellar symptoms.  Psych: Awake and oriented X 3, normal affect, Insight and Judgment appropriate.    Quentin Mullingollier, Dempsey Ahonen, PA-C 8:48 AM [facility]

## 2014-10-05 NOTE — Patient Instructions (Signed)
Add ENTERIC COATED low dose 81 mg Aspirin daily OR can do every other day if you have easy bruising to protect your heart and head.   Diabetes is a very complicated disease...lets simplify it.  An easy way to look at it to understand the complications is if you think of the extra sugar floating in your blood stream as glass shards floating through your blood stream.    Diabetes affects your small vessels first: 1) The glass shards (sugar) scraps down the tiny blood vessels in your eyes and lead to diabetic retinopathy, the leading cause of blindness in the [place]. Diabetes is the leading cause of newly diagnosed adult (3720 to 59 years of age) blindness in the [place].  2) The glass shards scratches down the tiny vessels of your legs leading to nerve damage called neuropathy and can lead to amputations of your feet. More than 60% of all non-traumatic amputations of lower limbs occur in people with diabetes.  3) Over time the small vessels in your brain are shredded and closed off, individually this does not cause any problems but over a long period of time many of the small vessels being blocked can lead to Vascular Dementia.   4) Your kidney's are a filter system and have a "net" that keeps certain things in the body and lets bad things out. Sugar shreds this net and leads to kidney damage and eventually failure. Decreasing the sugar that is destroying the net and certain blood pressure medications can help stop or decrease progression of kidney disease. Diabetes was the primary cause of kidney failure in 44 percent of all new cases in 2011.  5) Diabetes also destroys the small vessels in your penis that lead to erectile dysfunction. Eventually the vessels are so damaged that you may not be responsive to cialis or viagra.   Diabetes and your large vessels: Your larger vessels consist of your coronary arteries in your heart and the carotid vessels to your brain. Diabetes or even increased sugars  put you at 300% increased risk of heart attack and stroke and this is why.. The sugar scrapes down your large blood vessels and your body sees this as an internal injury and tries to repair itself. Just like you get a scab on your skin, your platelets will stick to the blood vessel wall trying to heal it. This is why we have diabetics on low dose aspirin daily, this prevents the platelets from sticking and can prevent plaque formation. In addition, your body takes cholesterol and tries to shove it into the open wound. This is why we want your LDL, or bad cholesterol, below 70.   The combination of platelets and cholesterol over 5-10 years forms plaque that can break off and cause a heart attack or stroke.   PLEASE REMEMBER:  Diabetes is preventable! Up to 85 percent of complications and morbidities among individuals with type 2 diabetes can be prevented, delayed, or effectively treated and minimized with regular visits to a health professional, appropriate monitoring and medication, and a healthy diet and lifestyle.     Bad carbs also include fruit juice, alcohol, and sweet tea. These are empty calories that do not signal to your brain that you are full.   Please remember the good carbs are still carbs which convert into sugar. So please measure them out no more than 1/2-1 cup of rice, oatmeal, pasta, and beans  Veggies are however free foods! Pile them on.   Not all fruit  is created equal. Please see the list below, the fruit at the bottom is higher in sugars than the fruit at the top. Please avoid all dried fruits.      We want weight loss that will last so you should lose 1-2 pounds a week.  THAT IS IT! Please pick THREE things a month to change. Once it is a habit check off the item. Then pick another three items off the list to become habits.  If you are already doing a habit on the list GREAT!  Cross that item off! o Don't drink your calories. Ie, alcohol, soda, fruit juice, and sweet  tea.  o Drink more water. Drink a glass when you feel hungry or before each meal.  o Eat breakfast - Complex carb and protein (likeDannon light and fit yogurt, oatmeal, fruit, eggs, [place] bacon). o Measure your cereal.  Eat no more than one cup a day. (ie [place]) o Eat an apple a day. o Add a vegetable a day. o Try a new vegetable a month. o Use Pam! Stop using oil or butter to cook. o Don't finish your plate or use smaller plates. o Share your dessert. o Eat sugar free Jello for dessert or frozen grapes. o Don't eat 2-3 hours before bed. o Switch to whole wheat bread, pasta, and brown rice. o Make healthier choices when you eat out. No fries! o Pick baked chicken, NOT fried. o Don't forget to SLOW DOWN when you eat. It is not going anywhere.  o Take the stairs. o Park far away in the parking lot o [employer] soup cans (or weights) for 10 minutes while watching TV. o Walk at work for 10 minutes during break. o Walk outside 1 time a week with your friend, kids, dog, or significant other. o Start a walking group at church. o Walk the mall as much as you can tolerate.  o Keep a food diary. o Weigh yourself daily. o Walk for 15 minutes 3 days per week. o Cook at home more often and eat out less.  If life happens and you go back to old habits, it is okay.  Just start over. You can do it!   If you experience chest pain, get short of breath, or tired during the exercise, please stop immediately and inform your doctor.

## 2014-10-06 LAB — HEMOGLOBIN A1C
Hgb A1c MFr Bld: 6.7 % — ABNORMAL HIGH (ref <5.7)
Mean Plasma Glucose: 146 mg/dL — ABNORMAL HIGH (ref <117)

## 2014-10-06 LAB — VITAMIN D 25 HYDROXY (VIT D DEFICIENCY, FRACTURES): Vit D, 25-Hydroxy: 59 ng/mL (ref 30–100)

## 2014-10-21 ENCOUNTER — Other Ambulatory Visit: Payer: Self-pay | Admitting: Internal Medicine

## 2014-11-07 ENCOUNTER — Other Ambulatory Visit: Payer: BC Managed Care – PPO

## 2014-11-07 DIAGNOSIS — Z79899 Other long term (current) drug therapy: Secondary | ICD-10-CM

## 2014-11-07 DIAGNOSIS — I10 Essential (primary) hypertension: Secondary | ICD-10-CM

## 2014-11-07 LAB — MAGNESIUM: Magnesium: 1.8 mg/dL (ref 1.5–2.5)

## 2014-11-07 LAB — BASIC METABOLIC PANEL WITH GFR
BUN: 27 mg/dL — ABNORMAL HIGH (ref 6–23)
CO2: 25 mEq/L (ref 19–32)
Calcium: 9.5 mg/dL (ref 8.4–10.5)
Chloride: 104 mEq/L (ref 96–112)
Creat: 1.16 mg/dL (ref 0.50–1.35)
GFR, Est African American: 80 mL/min
GFR, Est Non African American: 69 mL/min
Glucose, Bld: 103 mg/dL — ABNORMAL HIGH (ref 70–99)
Potassium: 4.1 mEq/L (ref 3.5–5.3)
Sodium: 140 mEq/L (ref 135–145)

## 2014-11-07 MED ORDER — GLUCOSE BLOOD VI STRP: ORAL_STRIP | Status: DC

## 2014-11-29 ENCOUNTER — Other Ambulatory Visit: Payer: Self-pay | Admitting: Internal Medicine

## 2014-11-29 ENCOUNTER — Other Ambulatory Visit: Payer: Self-pay | Admitting: *Deleted

## 2014-11-29 MED ORDER — SIMVASTATIN 40 MG PO TABS: ORAL_TABLET | ORAL | Status: DC

## 2014-11-30 ENCOUNTER — Emergency Department (HOSPITAL_COMMUNITY): Payer: BC Managed Care – PPO

## 2014-11-30 ENCOUNTER — Encounter (HOSPITAL_COMMUNITY): Payer: Self-pay | Admitting: Emergency Medicine

## 2014-11-30 ENCOUNTER — Emergency Department (HOSPITAL_COMMUNITY)
Admission: EM | Admit: 2014-11-30 | Discharge: 2014-12-01 | Disposition: A | Discharge status: Home / Self Care | Payer: BC Managed Care – PPO | Attending: Emergency Medicine | Admitting: Emergency Medicine

## 2014-11-30 DIAGNOSIS — R51 Headache: Secondary | ICD-10-CM | POA: Diagnosis present

## 2014-11-30 DIAGNOSIS — I10 Essential (primary) hypertension: Secondary | ICD-10-CM | POA: Diagnosis not present

## 2014-11-30 DIAGNOSIS — E559 Vitamin D deficiency, unspecified: Secondary | ICD-10-CM | POA: Diagnosis not present

## 2014-11-30 DIAGNOSIS — E119 Type 2 diabetes mellitus without complications: Secondary | ICD-10-CM | POA: Diagnosis not present

## 2014-11-30 DIAGNOSIS — E669 Obesity, unspecified: Secondary | ICD-10-CM | POA: Insufficient documentation

## 2014-11-30 DIAGNOSIS — Z79899 Other long term (current) drug therapy: Secondary | ICD-10-CM | POA: Diagnosis not present

## 2014-11-30 DIAGNOSIS — R519 Headache, unspecified: Secondary | ICD-10-CM

## 2014-11-30 DIAGNOSIS — Z87891 Personal history of nicotine dependence: Secondary | ICD-10-CM | POA: Insufficient documentation

## 2014-11-30 DIAGNOSIS — E785 Hyperlipidemia, unspecified: Secondary | ICD-10-CM | POA: Diagnosis not present

## 2014-11-30 LAB — CBC WITH DIFFERENTIAL/PLATELET
Basophils Absolute: 0 10*3/uL (ref 0.0–0.1)
Basophils Relative: 0 % (ref 0–1)
Eosinophils Absolute: 0.1 10*3/uL (ref 0.0–0.7)
Eosinophils Relative: 2 % (ref 0–5)
HCT: 43.7 % (ref 39.0–52.0)
Hemoglobin: 14.2 g/dL (ref 13.0–17.0)
Lymphocytes Relative: 22 % (ref 12–46)
Lymphs Abs: 1.8 10*3/uL (ref 0.7–4.0)
MCH: 27.2 pg (ref 26.0–34.0)
MCHC: 32.5 g/dL (ref 30.0–36.0)
MCV: 83.7 fL (ref 78.0–100.0)
Monocytes Absolute: 0.8 10*3/uL (ref 0.1–1.0)
Monocytes Relative: 10 % (ref 3–12)
Neutro Abs: 5.5 10*3/uL (ref 1.7–7.7)
Neutrophils Relative %: 66 % (ref 43–77)
Platelets: 258 10*3/uL (ref 150–400)
RBC: 5.22 MIL/uL (ref 4.22–5.81)
RDW: 13.6 % (ref 11.5–15.5)
WBC: 8.2 10*3/uL (ref 4.0–10.5)

## 2014-11-30 LAB — BASIC METABOLIC PANEL
Anion gap: 8 (ref 5–15)
BUN: 23 mg/dL (ref 6–23)
CO2: 22 mmol/L (ref 19–32)
Calcium: 8.7 mg/dL (ref 8.4–10.5)
Chloride: 105 mmol/L (ref 96–112)
Creatinine, Ser: 1.1 mg/dL (ref 0.50–1.35)
GFR calc Af Amer: 83 mL/min — ABNORMAL LOW (ref >90)
GFR calc non Af Amer: 72 mL/min — ABNORMAL LOW (ref >90)
Glucose, Bld: 154 mg/dL — ABNORMAL HIGH (ref 70–99)
Potassium: 3.7 mmol/L (ref 3.5–5.1)
Sodium: 135 mmol/L (ref 135–145)

## 2014-11-30 LAB — I-STAT TROPONIN, ED: Troponin i, poc: 0 ng/mL (ref 0.00–0.08)

## 2014-11-30 LAB — CBG MONITORING, ED: Glucose-Capillary: 130 mg/dL — ABNORMAL HIGH (ref 70–99)

## 2014-11-30 MED ORDER — SODIUM CHLORIDE 0.9 % IV BOLUS (SEPSIS)
1000.0000 mL | Freq: Once | INTRAVENOUS | Status: AC
  Administered 2014-11-30: 1000 mL via INTRAVENOUS

## 2014-11-30 MED ORDER — METOCLOPRAMIDE HCL 5 MG/ML IJ SOLN
10.0000 mg | Freq: Once | INTRAMUSCULAR | Status: AC
  Administered 2014-11-30: 10 mg via INTRAVENOUS
  Filled 2014-11-30: qty 2

## 2014-11-30 MED ORDER — DIPHENHYDRAMINE HCL 50 MG/ML IJ SOLN
25.0000 mg | Freq: Once | INTRAMUSCULAR | Status: AC
  Administered 2014-11-30: 25 mg via INTRAVENOUS
  Filled 2014-11-30: qty 1

## 2014-11-30 NOTE — ED Provider Notes (Signed)
CSN: [id number]     Arrival date & time 11/30/14  2112 History   First MD Initiated Contact with Patient 11/30/14 2145     Chief Complaint  Patient presents with  . Headache      The history is provided by the patient. No language interpreter was used.   [name] [name] presents for evaluation of headache. His headache started when he was driving a few hours ago.  It is located on the top of his head and in the frontal region.  It is constant in nature. It is nonradiating. He has no photophobia or nausea. He developed pain in bilateral lower extremities at the same time as the headache. He denies any chest pain, abdominal pain, shortness of breath, vomiting, fevers. He has not checked his sugar in about a month. He thinks it is been okay. He has been out of one of his blood pressure medicines for the last few days but should be up to pick this up tomorrow at the pharmacy. Symptoms are moderate, constant, improving.  Past Medical History  Diagnosis Date  . Hypertension   . Seasonal allergies   . Hyperlipidemia   . Diabetes mellitus without complication   . Vitamin D deficiency   . Obesity    Past Surgical History  Procedure Laterality Date  . Carpal tunnel release  2008    bilateral  . Knee arthroscopy  2010    left  . Cervical discectomy  2003   Family History  Problem Relation Age of Onset  . Colon cancer Neg Hx   . Stomach cancer Neg Hx   . Diabetes Father   . Hypertension Father   . Diabetes Sister   . Kidney disease Sister   . Heart disease Mother   . Stroke Mother   . Diabetes Mother    History  Substance Use Topics  . Smoking status: Former Smoker    Quit date: 02/25/1991  . Smokeless tobacco: Never Used  . Alcohol Use: No    Review of Systems  All other systems reviewed and are negative.     Allergies  Review of patient's allergies indicates no known allergies.  Home Medications   Prior to Admission medications   Medication Sig Start Date End Date  Taking? Authorizing Provider  CARTIA XT 120 MG 24 hr capsule TAKE 1 CAPSULE DAILY FOR BLOOD PRESSURE 11/29/14   [name] Cowboy, MD  Cholecalciferol (VITAMIN D-3) 5000 UNITS TABS Take 1 tablet by mouth daily.    Historical Provider, MD  Dapagliflozin Propanediol (FARXIGA) 10 MG TABS Take 1 tablet daily for Diabetes 06/07/14 12/07/14  [name] Cowboy, MD  fish oil-omega-3 fatty acids 1000 MG capsule Take 1,400 mg by mouth daily.     Historical Provider, MD  glipiZIDE (GLUCOTROL) 5 MG tablet Take 5 mg by mouth as needed. Patient takes when blood sugar is over 160    Historical Provider, MD  glucose blood (ACCU-CHEK SMARTVIEW) test strip Use as instructed 11/07/14   [name] Cowboy, MD  losartan (COZAAR) 100 MG tablet TAKE 1 TABLET DAILY FOR BLOOD PRESSURE AND DIABETIC KIDNEY PROTECTION. 10/21/14   [name] Cowboy, MD  Magnesium-Zinc (MAGNESIUM-CHELATED ZINC PO) Take 400 mg by mouth daily.     Historical Provider, MD  metFORMIN (GLUCOPHAGE-XR) 500 MG 24 hr tablet Take 2 tablets 2 x day with meals for Diabetes 06/07/14   [name] Cowboy, MD  Multiple Vitamins-Minerals [facility] PREMIER 50 PLUS PO) Take 1 tablet by mouth daily.    Historical Provider, MD  simvastatin (ZOCOR) 40 MG tablet TAKE ONE TABLET BY MOUTH ONCE DAILY IN THE EVENING 11/29/14   [name] CowboyWilliam McKeown, MD   There were no vitals taken for this visit. Physical Exam  Constitutional: He is oriented to person, place, and time. He appears well-developed and well-nourished.  HENT:  Head: Normocephalic and atraumatic.  Eyes: EOM are normal. Pupils are equal, round, and reactive to light.  Cardiovascular: Normal rate and regular rhythm.   No murmur heard. Pulmonary/Chest: Effort normal and breath sounds normal. No respiratory distress.  Abdominal: Soft. There is no tenderness. There is no rebound and no guarding.  Musculoskeletal: He exhibits no edema or tenderness.  Neurological: He is alert and oriented to person, place, and time. No cranial  nerve deficit.  Skin: Skin is warm and dry.  Psychiatric: He has a normal mood and affect. His behavior is normal.  Nursing note and vitals reviewed.   ED Course  Procedures (including critical care time) Labs Review Labs Reviewed  BASIC METABOLIC PANEL - Abnormal; Notable for the following:    Glucose, Bld 154 (*)    GFR calc non Af Amer 72 (*)    GFR calc Af Amer 83 (*)    All other components within normal limits  CBG MONITORING, ED - Abnormal; Notable for the following:    Glucose-Capillary 130 (*)    All other components within normal limits  CBC WITH DIFFERENTIAL/PLATELET  I-STAT TROPOININ, ED    Imaging Review No results found.   EKG Interpretation   Date/Time:  Thursday November 30 2014 21:20:22 EDT Ventricular Rate:  82 PR Interval:  220 QRS Duration: 96 QT Interval:  397 QTC Calculation: 464 R Axis:   79 Text Interpretation:  Sinus rhythm Prolonged PR interval diffuse ST  elevation, present on prior EKG8/6/10 Confirmed by Lincoln Brighamees, [name] [phone]([id number]) on  11/30/2014 9:48:21 PM      MDM   Final diagnoses:  Acute nonintractable headache, unspecified headache type    Patient here for evaluation of sudden onset headache. Patient's headache is resolved on recheck. CT head obtained given sudden onset, no evidence of subarachnoid hemorrhage and low index suspicion of subarachnoid hemorrhage do not feel further workup with LP is warranted. History and presentation is not consistent with meningitis, temporal arteritis, sinusitis. Discussed PCP follow-up and return precautions.    Tilden FossaElizabeth Linh Hedberg, MD 12/01/14 0005

## 2014-11-30 NOTE — ED Notes (Signed)
Brought in by EMS from home with c/o headache.  Pt reported severe headache, onset an hour ago.  Pt also c/o bilateral leg pain; pt denies injury or trauma.

## 2014-11-30 NOTE — ED Notes (Signed)
Bed: WA09 Expected date:  Expected time:  Means of arrival:  Comments: EMS severe 10/10 headache-hypertension-out of blood pressure meds x 1 week/bilateral legs tingling

## 2014-12-01 NOTE — Discharge Instructions (Signed)
Headaches, Frequently Asked Questions °MIGRAINE HEADACHES °Q: What is migraine? What causes it? How can I treat it? °A: Generally, migraine headaches begin as a dull ache. Then they develop into a constant, throbbing, and pulsating pain. You may experience pain at the temples. You may experience pain at the front or back of one or both sides of the head. The pain is usually accompanied by a combination of: °· Nausea. °· Vomiting. °· Sensitivity to light and noise. °Some people (about 15%) experience an aura (see below) before an attack. The cause of migraine is believed to be chemical reactions in the brain. Treatment for migraine may include over-the-counter or prescription medications. It may also include self-help techniques. These include relaxation training and biofeedback.  °Q: What is an aura? °A: About 15% of people with migraine get an "aura". This is a sign of neurological symptoms that occur before a migraine headache. You may see wavy or jagged lines, dots, or flashing lights. You might experience tunnel vision or blind spots in one or both eyes. The aura can include visual or auditory hallucinations (something imagined). It may include disruptions in smell (such as strange odors), taste or touch. Other symptoms include: °· Numbness. °· A "pins and needles" sensation. °· Difficulty in recalling or speaking the correct word. °These neurological events may last as long as 60 minutes. These symptoms will fade as the headache begins. °Q: What is a trigger? °A: Certain physical or environmental factors can lead to or "trigger" a migraine. These include: °· Foods. °· Hormonal changes. °· Weather. °· Stress. °It is important to remember that triggers are different for everyone. To help prevent migraine attacks, you need to figure out which triggers affect you. Keep a headache diary. This is a good way to track triggers. The diary will help you talk to your healthcare professional about your condition. °Q: Does  weather affect migraines? °A: Bright sunshine, hot, humid conditions, and drastic changes in barometric pressure may lead to, or "trigger," a migraine attack in some people. But studies have shown that weather does not act as a trigger for everyone with migraines. °Q: What is the link between migraine and hormones? °A: Hormones start and regulate many of your body's functions. Hormones keep your body in balance within a constantly changing environment. The levels of hormones in your body are unbalanced at times. Examples are during menstruation, pregnancy, or menopause. That can lead to a migraine attack. In fact, about three quarters of all women with migraine report that their attacks are related to the menstrual cycle.  °Q: Is there an increased risk of stroke for migraine sufferers? °A: The likelihood of a migraine attack causing a stroke is very remote. That is not to say that migraine sufferers cannot have a stroke associated with their migraines. In persons under age 40, the most common associated factor for stroke is migraine headache. But over the course of a person's normal life span, the occurrence of migraine headache may actually be associated with a reduced risk of dying from cerebrovascular disease due to stroke.  °Q: What are acute medications for migraine? °A: Acute medications are used to treat the pain of the headache after it has started. Examples over-the-counter medications, NSAIDs, ergots, and triptans.  °Q: What are the triptans? °A: Triptans are the newest class of abortive medications. They are specifically targeted to treat migraine. Triptans are vasoconstrictors. They moderate some chemical reactions in the brain. The triptans work on receptors in your brain. Triptans help   to restore the balance of a neurotransmitter called serotonin. Fluctuations in levels of serotonin are thought to be a main cause of migraine.  °Q: Are over-the-counter medications for migraine effective? °A:  Over-the-counter, or "OTC," medications may be effective in relieving mild to moderate pain and associated symptoms of migraine. But you should see your caregiver before beginning any treatment regimen for migraine.  °Q: What are preventive medications for migraine? °A: Preventive medications for migraine are sometimes referred to as "prophylactic" treatments. They are used to reduce the frequency, severity, and length of migraine attacks. Examples of preventive medications include antiepileptic medications, antidepressants, beta-blockers, calcium channel blockers, and NSAIDs (nonsteroidal anti-inflammatory drugs). °Q: Why are anticonvulsants used to treat migraine? °A: During the past few years, there has been an increased interest in antiepileptic drugs for the prevention of migraine. They are sometimes referred to as "anticonvulsants". Both epilepsy and migraine may be caused by similar reactions in the brain.  °Q: Why are antidepressants used to treat migraine? °A: Antidepressants are typically used to treat people with depression. They may reduce migraine frequency by regulating chemical levels, such as serotonin, in the brain.  °Q: What alternative therapies are used to treat migraine? °A: The term "alternative therapies" is often used to describe treatments considered outside the scope of conventional Western medicine. Examples of alternative therapy include acupuncture, acupressure, and yoga. Another common alternative treatment is herbal therapy. Some herbs are believed to relieve headache pain. Always discuss alternative therapies with your caregiver before proceeding. Some herbal products contain arsenic and other toxins. °TENSION HEADACHES °Q: What is a tension-type headache? What causes it? How can I treat it? °A: Tension-type headaches occur randomly. They are often the result of temporary stress, anxiety, fatigue, or anger. Symptoms include soreness in your temples, a tightening band-like sensation  around your head (a "vice-like" ache). Symptoms can also include a pulling feeling, pressure sensations, and contracting head and neck muscles. The headache begins in your forehead, temples, or the back of your head and neck. Treatment for tension-type headache may include over-the-counter or prescription medications. Treatment may also include self-help techniques such as relaxation training and biofeedback. °CLUSTER HEADACHES °Q: What is a cluster headache? What causes it? How can I treat it? °A: Cluster headache gets its name because the attacks come in groups. The pain arrives with little, if any, warning. It is usually on one side of the head. A tearing or bloodshot eye and a runny nose on the same side of the headache may also accompany the pain. Cluster headaches are believed to be caused by chemical reactions in the brain. They have been described as the most severe and intense of any headache type. Treatment for cluster headache includes prescription medication and oxygen. °SINUS HEADACHES °Q: What is a sinus headache? What causes it? How can I treat it? °A: When a cavity in the bones of the face and skull (a sinus) becomes inflamed, the inflammation will cause localized pain. This condition is usually the result of an allergic reaction, a tumor, or an infection. If your headache is caused by a sinus blockage, such as an infection, you will probably have a fever. An x-ray will confirm a sinus blockage. Your caregiver's treatment might include antibiotics for the infection, as well as antihistamines or decongestants.  °REBOUND HEADACHES °Q: What is a rebound headache? What causes it? How can I treat it? °A: A pattern of taking acute headache medications too often can lead to a condition known as "rebound headache."   A pattern of taking too much headache medication includes taking it more than 2 days per week or in excessive amounts. That means more than the label or a caregiver advises. With rebound  headaches, your medications not only stop relieving pain, they actually begin to cause headaches. Doctors treat rebound headache by tapering the medication that is being overused. Sometimes your caregiver will gradually substitute a different type of treatment or medication. Stopping may be a challenge. Regularly overusing a medication increases the potential for serious side effects. Consult a caregiver if you regularly use headache medications more than 2 days per week or more than the label advises. °ADDITIONAL QUESTIONS AND ANSWERS °Q: What is biofeedback? °A: Biofeedback is a self-help treatment. Biofeedback uses special equipment to monitor your body's involuntary physical responses. Biofeedback monitors: °· Breathing. °· Pulse. °· Heart rate. °· Temperature. °· Muscle tension. °· Brain activity. °Biofeedback helps you refine and perfect your relaxation exercises. You learn to control the physical responses that are related to stress. Once the technique has been mastered, you do not need the equipment any more. °Q: Are headaches hereditary? °A: Four out of five (80%) of people that suffer report a family history of migraine. Scientists are not sure if this is genetic or a family predisposition. Despite the uncertainty, a child has a 50% chance of having migraine if one parent suffers. The child has a 75% chance if both parents suffer.  °Q: Can children get headaches? °A: By the time they reach high school, most young people have experienced some type of headache. Many safe and effective approaches or medications can prevent a headache from occurring or stop it after it has begun.  °Q: What type of doctor should I see to diagnose and treat my headache? °A: Start with your primary caregiver. Discuss his or her experience and approach to headaches. Discuss methods of classification, diagnosis, and treatment. Your caregiver may decide to recommend you to a headache specialist, depending upon your symptoms or other  physical conditions. Having diabetes, allergies, etc., may require a more comprehensive and inclusive approach to your headache. The [employer] will provide, upon request, a list of NHF physician members in your state. °Document Released: 10/25/2003 Document Revised: 10/27/2011 Document Reviewed: 04/03/2008 °ExitCare® Patient Information ©2015 [employer]. This information is not intended to replace advice given to you by your health care provider. Make sure you discuss any questions you have with your health care provider. ° °General Headache Without Cause °A headache is pain or discomfort felt around the head or neck area. The specific cause of a headache may not be found. There are many causes and types of headaches. A few common ones are: °· Tension headaches. °· Migraine headaches. °· Cluster headaches. °· Chronic daily headaches. °HOME CARE INSTRUCTIONS  °· Keep all follow-up appointments with your caregiver or any specialist referral. °· Only take over-the-counter or prescription medicines for pain or discomfort as directed by your caregiver. °· Lie down in a dark, quiet room when you have a headache. °· Keep a headache journal to find out what may trigger your migraine headaches. For example, write down: °¨ What you eat and drink. °¨ How much sleep you get. °¨ Any change to your diet or medicines. °· Try massage or other relaxation techniques. °· Put ice packs or heat on the head and neck. Use these 3 to 4 times per day for 15 to 20 minutes each time, or as needed. °· Limit stress. °· Sit up   straight, and do not tense your muscles. °· Quit smoking if you smoke. °· Limit alcohol use. °· Decrease the amount of caffeine you drink, or stop drinking caffeine. °· Eat and sleep on a regular schedule. °· Get 7 to 9 hours of sleep, or as recommended by your caregiver. °· Keep lights dim if bright lights bother you and make your headaches worse. °SEEK MEDICAL CARE IF:  °· You have problems with the  medicines you were prescribed. °· Your medicines are not working. °· You have a change from the usual headache. °· You have nausea or vomiting. °SEEK IMMEDIATE MEDICAL CARE IF:  °· Your headache becomes severe. °· You have a fever. °· You have a stiff neck. °· You have loss of vision. °· You have muscular weakness or loss of muscle control. °· You start losing your balance or have trouble walking. °· You feel faint or pass out. °· You have severe symptoms that are different from your first symptoms. °MAKE SURE YOU:  °· Understand these instructions. °· Will watch your condition. °· Will get help right away if you are not doing well or get worse. °Document Released: 08/04/2005 Document Revised: 10/27/2011 Document Reviewed: 08/20/2011 °ExitCare® Patient Information ©2015 [employer]. This information is not intended to replace advice given to you by your health care provider. Make sure you discuss any questions you have with your health care provider. ° °

## 2015-01-19 ENCOUNTER — Ambulatory Visit: Payer: Self-pay | Admitting: Physician Assistant

## 2015-01-23 ENCOUNTER — Ambulatory Visit (INDEPENDENT_AMBULATORY_CARE_PROVIDER_SITE_OTHER): Payer: Commercial Managed Care - HMO | Admitting: Physician Assistant

## 2015-01-23 ENCOUNTER — Encounter: Payer: Self-pay | Admitting: Physician Assistant

## 2015-01-23 VITALS — BP 138/84 | HR 80 | Temp 97.7°F | Resp 16 | Ht 68.5 in | Wt 302.0 lb

## 2015-01-23 DIAGNOSIS — Z79899 Other long term (current) drug therapy: Secondary | ICD-10-CM

## 2015-01-23 DIAGNOSIS — Z1331 Encounter for screening for depression: Secondary | ICD-10-CM

## 2015-01-23 DIAGNOSIS — E1142 Type 2 diabetes mellitus with diabetic polyneuropathy: Secondary | ICD-10-CM | POA: Insufficient documentation

## 2015-01-23 DIAGNOSIS — G894 Chronic pain syndrome: Secondary | ICD-10-CM | POA: Insufficient documentation

## 2015-01-23 DIAGNOSIS — E559 Vitamin D deficiency, unspecified: Secondary | ICD-10-CM

## 2015-01-23 DIAGNOSIS — E1129 Type 2 diabetes mellitus with other diabetic kidney complication: Secondary | ICD-10-CM | POA: Diagnosis not present

## 2015-01-23 DIAGNOSIS — E1122 Type 2 diabetes mellitus with diabetic chronic kidney disease: Secondary | ICD-10-CM | POA: Diagnosis not present

## 2015-01-23 DIAGNOSIS — N529 Male erectile dysfunction, unspecified: Secondary | ICD-10-CM

## 2015-01-23 DIAGNOSIS — Z0000 Encounter for general adult medical examination without abnormal findings: Secondary | ICD-10-CM

## 2015-01-23 DIAGNOSIS — N189 Chronic kidney disease, unspecified: Secondary | ICD-10-CM | POA: Diagnosis not present

## 2015-01-23 DIAGNOSIS — E1169 Type 2 diabetes mellitus with other specified complication: Secondary | ICD-10-CM | POA: Insufficient documentation

## 2015-01-23 DIAGNOSIS — E1143 Type 2 diabetes mellitus with diabetic autonomic (poly)neuropathy: Secondary | ICD-10-CM

## 2015-01-23 DIAGNOSIS — Z789 Other specified health status: Secondary | ICD-10-CM

## 2015-01-23 DIAGNOSIS — G4733 Obstructive sleep apnea (adult) (pediatric): Secondary | ICD-10-CM

## 2015-01-23 DIAGNOSIS — K219 Gastro-esophageal reflux disease without esophagitis: Secondary | ICD-10-CM

## 2015-01-23 DIAGNOSIS — E785 Hyperlipidemia, unspecified: Secondary | ICD-10-CM | POA: Diagnosis not present

## 2015-01-23 DIAGNOSIS — N521 Erectile dysfunction due to diseases classified elsewhere: Secondary | ICD-10-CM

## 2015-01-23 DIAGNOSIS — I10 Essential (primary) hypertension: Secondary | ICD-10-CM

## 2015-01-23 DIAGNOSIS — I723 Aneurysm of iliac artery: Secondary | ICD-10-CM

## 2015-01-23 LAB — CBC WITH DIFFERENTIAL/PLATELET
Basophils Absolute: 0.1 10*3/uL (ref 0.0–0.1)
Basophils Relative: 1 % (ref 0–1)
Eosinophils Absolute: 0.3 10*3/uL (ref 0.0–0.7)
Eosinophils Relative: 4 % (ref 0–5)
HCT: 43.9 % (ref 39.0–52.0)
Hemoglobin: 14 g/dL (ref 13.0–17.0)
Lymphocytes Relative: 39 % (ref 12–46)
Lymphs Abs: 2.5 10*3/uL (ref 0.7–4.0)
MCH: 26.1 pg (ref 26.0–34.0)
MCHC: 31.9 g/dL (ref 30.0–36.0)
MCV: 81.8 fL (ref 78.0–100.0)
MPV: 9 fL (ref 8.6–12.4)
Monocytes Absolute: 0.7 10*3/uL (ref 0.1–1.0)
Monocytes Relative: 11 % (ref 3–12)
Neutro Abs: 2.8 10*3/uL (ref 1.7–7.7)
Neutrophils Relative %: 45 % (ref 43–77)
Platelets: 312 10*3/uL (ref 150–400)
RBC: 5.37 MIL/uL (ref 4.22–5.81)
RDW: 13.6 % (ref 11.5–15.5)
WBC: 6.3 10*3/uL (ref 4.0–10.5)

## 2015-01-23 LAB — HEMOGLOBIN A1C
Hgb A1c MFr Bld: 6.8 % — ABNORMAL HIGH (ref <5.7)
Mean Plasma Glucose: 148 mg/dL — ABNORMAL HIGH (ref <117)

## 2015-01-23 LAB — HEPATIC FUNCTION PANEL
ALT: 9 U/L (ref 0–53)
AST: 19 U/L (ref 0–37)
Albumin: 3.9 g/dL (ref 3.5–5.2)
Alkaline Phosphatase: 63 U/L (ref 39–117)
Bilirubin, Direct: 0.1 mg/dL (ref 0.0–0.3)
Indirect Bilirubin: 0.2 mg/dL (ref 0.2–1.2)
Total Bilirubin: 0.3 mg/dL (ref 0.2–1.2)
Total Protein: 7.7 g/dL (ref 6.0–8.3)

## 2015-01-23 LAB — MAGNESIUM: Magnesium: 2.1 mg/dL (ref 1.5–2.5)

## 2015-01-23 LAB — BASIC METABOLIC PANEL WITH GFR
BUN: 22 mg/dL (ref 6–23)
CO2: 26 mEq/L (ref 19–32)
Calcium: 9.5 mg/dL (ref 8.4–10.5)
Chloride: 105 mEq/L (ref 96–112)
Creat: 0.97 mg/dL (ref 0.50–1.35)
GFR, Est African American: >89 mL/min
GFR, Est Non African American: 85 mL/min
Glucose, Bld: 76 mg/dL (ref 70–99)
Potassium: 4.1 mEq/L (ref 3.5–5.3)
Sodium: 140 mEq/L (ref 135–145)

## 2015-01-23 LAB — LIPID PANEL
Cholesterol: 163 mg/dL (ref 0–200)
HDL: 42 mg/dL (ref >=40)
LDL Cholesterol: 99 mg/dL (ref 0–99)
Total CHOL/HDL Ratio: 3.9 Ratio
Triglycerides: 108 mg/dL (ref <150)
VLDL: 22 mg/dL (ref 0–40)

## 2015-01-23 LAB — TSH: TSH: 1.585 u[IU]/mL (ref 0.350–4.500)

## 2015-01-23 NOTE — Progress Notes (Signed)
WELCOME TO MEDICARE VISIT AND FOLLOW UP Assessment:   1. Essential hypertension - continue medications, DASH diet, exercise and monitor at home. Call if greater than 130/80.  - CBC with Differential/Platelet - BASIC METABOLIC PANEL WITH GFR - Hepatic function panel - TSH - EKG 12-Lead - US, RETROPERITNL ABD,  LTD  2. Type 2 diabetes mellitus with diabetic chronic kidney disease Discussed general issues about diabetes pathophysiology and management., Educational material distributed., Suggested low cholesterol diet., Encouraged aerobic exercise., Discussed foot care., Reminded to get yearly retinal exam. - Hemoglobin A1c - [facility] DIABETES FOOT EXAM  3. HLD (hyperlipidemia) -continue medications, check lipids, decrease fatty foods, increase activity.  - Lipid panel  4. Morbid obesity Obesity with co morbidities- long discussion about weight loss, diet, and exercise  5. Gastroesophageal reflux disease, esophagitis presence not specified Continue PPI/H2 blocker, diet discussed  6. Vitamin D deficiency - Vit D  25 hydroxy (rtn osteoporosis monitoring)  7. Medication management - Magnesium  8. Routine general medical examination at a health care facility Get eye exam  9. Depression screen negative  10. Aneurysm artery, iliac common History of smoking, obese, HTN, get aorta scan - US, RETROPERITNL ABD,  LTD  11. Chronic pain syndrome Continue follow up worker's comp  12. Diabetic autonomic neuropathy associated with type 2 diabetes mellitus Lyrica 75 mg samples given to patient for DM neuropathy and for blateral hand pain, if he does well will send in gabapentin - [facility] DIABETES FOOT EXAM  13. Erectile dysfunction, unspecified erectile dysfunction type viagra 100mg  samples given, weight loss and better sugar control advised  14. Obstructive sleep apnea Obese, HTN, snoring, fatigue, witnessed apnea, wiill get sleep study - Ambulatory referral to Sleep Studies  Over 30  minutes of exam, counseling, chart review, and critical decision making was performed  Plan:   During the course of the visit the patient was educated and counseled about appropriate screening and preventive services including:    Pneumococcal vaccine   Influenza vaccine  Td vaccine  Screening electrocardiogram  Colorectal cancer screening  Diabetes screening  Glaucoma screening  Nutrition counseling   Conditions/risks identified: BMI: Discussed weight loss, diet, and increase physical activity.  Increase physical activity: AHA recommends 150 minutes of physical activity a week.  Medications reviewed Diabetes is not at goal, ACE/ARB therapy: Yes. Urinary Incontinence is not an issue: discussed non pharmacology and pharmacology options.  Fall risk: low- discussed PT, home fall assessment, medications.    Subjective:  [name] [name] is a 59 y.o. male who presents for Medicare Annual Wellness Visit and 3 month follow up for HTN, obesity hyperlipidemia, diabetes, and vitamin D Def.   His blood pressure has been controlled at home, today their BP is BP: 138/84 mmHg He does workout, and is very excited about getting silver sneakers. He denies chest pain, shortness of breath, dizziness.  He is on cholesterol medication and denies myalgias. His cholesterol is not at goal of 70 or less. The cholesterol last visit was:   Lab Results  Component Value Date   CHOL 163 10/05/2014   HDL 42 10/05/2014   LDLCALC 87 10/05/2014   TRIG 168* 10/05/2014   CHOLHDL 3.9 10/05/2014   He has been working on diet and exercise for Diabetes with diabetic chronic kidney disease, he is on bASA, he is on ACE/ARB, last visit he complained of bilateral feet numbness/tingling, but it has been doing better with better control of his sugars and denies hypoglycemia , polydipsia, polyuria and  visual disturbances. Last A1C was:  Lab Results  Component Value Date   HGBA1C 6.7* 10/05/2014   Patient is on  Vitamin D supplement.   Lab Results  Component Value Date   VD25OH 59 10/05/2014  + snoring, witnessed apnea by GF, large tonsils, obese, will try to set up apnea test   Follows with Dr. Renae Fickle for left hand pain for worker's comp, not seeing pain management. Would like a medication to try for pain.  BMI is Body mass index is 45.25 kg/(m^2)., he is working on diet and exercise. Wt Readings from Last 3 Encounters:  01/23/15 302 lb (136.986 kg)  10/05/14 301 lb (136.533 kg)  06/06/14 295 lb 3.2 oz (133.902 kg)    Medication Review: Current Outpatient Prescriptions on File Prior to Visit  Medication Sig Dispense Refill  . CARTIA XT 120 MG 24 hr capsule TAKE 1 CAPSULE DAILY FOR BLOOD PRESSURE 30 capsule 3  . Cholecalciferol (VITAMIN D-3) 5000 UNITS TABS Take 1 tablet by mouth daily.    . fish oil-omega-3 fatty acids 1000 MG capsule Take 1,400 mg by mouth daily.     Marland Kitchen glipiZIDE (GLUCOTROL) 5 MG tablet Take 5 mg by mouth as needed. Patient takes when blood sugar is over 160    . glucose blood (ACCU-CHEK SMARTVIEW) test strip Use as instructed 100 each 12  . losartan (COZAAR) 100 MG tablet TAKE 1 TABLET DAILY FOR BLOOD PRESSURE AND DIABETIC KIDNEY PROTECTION. 90 tablet 0  . Magnesium-Zinc (MAGNESIUM-CHELATED ZINC PO) Take 400 mg by mouth daily.     . metFORMIN (GLUCOPHAGE-XR) 500 MG 24 hr tablet Take 2 tablets 2 x day with meals for Diabetes 360 tablet 1  . Multiple Vitamins-Minerals (THERAGRAN-M PREMIER 50 PLUS PO) Take 1 tablet by mouth daily.    . simvastatin (ZOCOR) 40 MG tablet TAKE ONE TABLET BY MOUTH ONCE DAILY IN THE EVENING 90 tablet 1   No current facility-administered medications on file prior to visit.    Current Problems (verified) Patient Active Problem List   Diagnosis Date Noted  . T2_NIDDM w/ Stage 2 CKD (GFR 77  ml/min) 06/06/2014  . Medication management 06/06/2014  . Vitamin D deficiency   . HTN (hypertension) 09/29/2012  . HLD (hyperlipidemia) 09/29/2012  . GERD  (gastroesophageal reflux disease) 09/29/2012  . Obesity 09/29/2012    Screening Tests Immunization History  Administered Date(s) Administered  . Pneumococcal-Unspecified 02/16/2013  . Tdap 02/10/2011   Preventative care: Last colonoscopy: 02/2012 Ct head 11/2014 US aorta 02/2014  Prior vaccinations: TD or Tdap: 2012  Influenza: declines  Pneumococcal: 2014 Prevnar13: N/A Shingles/Zostavax: N/A  Names of Other Physician/Practitioners you currently use: 1. [facility] here for primary care 2. Dr. Elmer Picker, eye doctor, will get new doctor, has been over 3 years 3. None , dentist, last visit unknown Patient Care Team: Lucky Cowboy, MD as PCP - General (Internal Medicine) Mardella Layman, MD as Consulting Physician (Gastroenterology) Mateo Flow, MD as Consulting Physician (Ophthalmology) Salvatore Marvel, MD as Consulting Physician (Orthopedic Surgery) Josephine Igo, MD as Consulting Physician (Orthopedic Surgery) Hilda Lias, MD as Consulting Physician (Neurosurgery)  Past Surgical History  Procedure Laterality Date  . Carpal tunnel release  2008    bilateral  . Knee arthroscopy  2010    left  . Cervical discectomy  2003   Family History  Problem Relation Age of Onset  . Colon cancer Neg Hx   . Stomach cancer Neg Hx   . Diabetes Father   .  Hypertension Father   . Diabetes Sister   . Kidney disease Sister   . Heart disease Mother   . Stroke Mother   . Diabetes Mother    History  Substance Use Topics  . Smoking status: Former Smoker    Quit date: 02/25/1991  . Smokeless tobacco: Never Used  . Alcohol Use: No    MEDICARE WELLNESS OBJECTIVES: Tobacco use: He does not smoke.  Patient is a former smoker. Alcohol Current alcohol use: none Caffeine Current caffeine use: coffee 1 /day Osteoporosis: dietary calcium and/or vitamin D deficiency, History of fracture in the past year: no Diet: in general, a "healthy" diet    Physical activity: walking and exercise class Depression/mood screen:   Depression screen [facility] 2/9 01/23/2015  Decreased Interest 0  Down, Depressed, Hopeless 0  PHQ - 2 Score 0   Hearing: normal Visual acuity: normal,  does not perform annual eye exam  ADLs:  In your present state of health, do you have any difficulty performing the following activities: 01/23/2015  Hearing? N  Vision? N  Difficulty concentrating or making decisions? N  Walking or climbing stairs? N  Dressing or bathing? N  Doing errands, shopping? N  Preparing Food and eating ? N  Using the Toilet? N  In the past six months, have you accidently leaked urine? N  Do you have problems with loss of bowel control? N  Managing your Medications? N  Housekeeping or managing your Housekeeping? N    Fall risk: Low Risk Cognitive Testing  Alert? Yes  Normal Appearance?Yes  Oriented to person? Yes  Place? Yes   Time? Yes  Recall of three objects?  Yes  Can perform simple calculations? Yes  Displays appropriate judgment?Yes  Can read the correct time from a watch face?Yes  EOL planning: Does patient have an advance directive?: No Would patient like information on creating an advanced directive?: Yes - Educational materials given    Objective:   Blood pressure 138/84, pulse 80, temperature 97.7 F (36.5 C), resp. rate 16, height 5' 8.5" (1.74 m), weight 302 lb (136.986 kg). Body mass index is 45.25 kg/(m^2).  General appearance: alert, no distress, WD/WN, male HEENT: normocephalic, sclerae anicteric, TMs pearly, nares patent, no discharge or erythema, pharynx normal, large obstructing tonsils.  Oral cavity: MMM, no lesions Neck: supple, no lymphadenopathy, no thyromegaly, no masses Heart: RRR, distant heart sounds, normal S1, S2, no murmurs Lungs: CTA bilaterally, no wheezes, rhonchi, or rales Abdomen: +bs, soft, obese,  non tender, non distended, no masses, no hepatomegaly, no splenomegaly Musculoskeletal:  nontender, no swelling, no obvious deformity Extremities: 1+ edema, no cyanosis, no clubbing Pulses: 2+ symmetric, upper and lower extremities, normal cap refill Neurological: alert, oriented x 3, CN2-12 intact, strength normal upper extremities and lower extremities, sensation normal throughout, DTRs 2+ throughout, no cerebellar signs, gait normal Psychiatric: normal affect, behavior normal, pleasant   Medicare Attestation I have personally reviewed: The patient's medical and social history Their use of alcohol, tobacco or illicit drugs Their current medications and supplements The patient's functional ability including ADLs,fall risks, home safety risks, cognitive, and hearing and visual impairment Diet and physical activities Evidence for depression or mood disorders  The patient's weight, height, BMI, and visual acuity have been recorded in the chart.  I have made referrals, counseling, and provided education to the patient based on review of the above and I have provided the patient with a written personalized care plan for preventive services.     Steffanie Dunn,  Marchelle Folks, PA-C   01/23/2015

## 2015-01-23 NOTE — Patient Instructions (Addendum)
Can take the lyrica samples for nerve pain. It can make you sleepy so we suggest trying it at night first and please plan to not drive or do anything strenuous. Also please do not take this medication with alcohol.  Start out 1 pill at night before bed, can increase to 2 pills at night before bed. Please call the office if you have any side effects.   Can take 3 pills a day however you would like  Some examples: - 1 breakfast, lunch, bedtime. - 1 at breakfast, 2 at bed time  How should I use this medicine? Take this medicine by mouth with a glass of water. Follow the directions on the prescription label. You can take this medicine with or without food. Take your doses at regular intervals. Do not take your medicine more often than directed. Do not stop taking except on your doctor's advice.  What if I miss a dose? If you miss a dose, take it as soon as you can. If it is almost time for your next dose, take only that dose. Do not take double or extra doses.  What should I watch for while using this medicine? Tell your doctor or healthcare professional if your symptoms do not start to get better or if they get worse.   You may get drowsy or dizzy. Do not drive, use machinery, or do anything that needs mental alertness until you know how this medicine affects you. Do not stand or sit up quickly, especially if you are an older patient. This reduces the risk of dizzy or fainting spells. Alcohol may interfere with the effect of this medicine. Avoid alcoholic drinks. If you have a heart condition, like congestive heart failure, and notice that you are retaining water and have swelling in your hands or feet, contact your health care provider immediately.  What side effects may I notice from receiving this medicine? Side effects that you should report to your doctor or health care professional as soon as possible and are very rare: -allergic reactions like skin rash, itching or hives, swelling of the  face, lips, or tongue -breathing problems -changes in vision -jerking or unusual movements of any part of your body -suicidal thoughts or other mood changes -swelling of the ankles, feet, hands -unusual bruising or bleeding  Side effects that usually do not require medical attention (Report these to your doctor or health care professional if they continue or are bothersome.): -dizziness -drowsiness -dry mouth -nausea -tremors  Recommendations For Diabetic/Prediabetic Patients:   -  Take medications as prescribed  -  Recommend Dr Francis Dowse Fuhrman's book "The End of Diabetes "  And "The End of Dieting"- Can get at  www.Amazon.com and encourage also get the Audio CD book  - AVOID Animal products, ie. Meat - red/white, Poultry and Dairy/especially cheese - Exercise at least 5 times a week for 30 minutes or preferably daily.  - No Smoking - Drink less than 2 drinks a day.  - Monitor your feet for sores - Have yearly Eye Exams - Recommend annual Flu vaccine  - Recommend Pneumovax and Prevnar vaccines - Shingles Vaccine (Zostavax) if over 20 y.o.  Goals:   - BMI less than 24 - Fasting sugar less than 130 or less than 150 if tapering medicines to lose weight  - Systolic BP less than 130  - Diastolic BP less than 80 - Bad LDL Cholesterol less than 70 - Triglycerides less than 150   Preventive Care for Adults A  healthy lifestyle and preventive care can promote health and wellness. Preventive health guidelines for men include the following key practices:  A routine yearly physical is a good way to check with your health care provider about your health and preventative screening. It is a chance to share any concerns and updates on your health and to receive a thorough exam.  Visit your dentist for a routine exam and preventative care every 6 months. Brush your teeth twice a day and floss once a day. Good oral hygiene prevents tooth decay and gum disease.  The frequency of eye exams is  based on your age, health, family medical history, use of contact lenses, and other factors. Follow your health care provider's recommendations for frequency of eye exams.  Eat a healthy diet. Foods such as vegetables, fruits, whole grains, low-fat dairy products, and lean protein foods contain the nutrients you need without too many calories. Decrease your intake of foods high in solid fats, added sugars, and salt. Eat the right amount of calories for you.Get information about a proper diet from your health care provider, if necessary.  Regular physical exercise is one of the most important things you can do for your health. Most adults should get at least 150 minutes of moderate-intensity exercise (any activity that increases your heart rate and causes you to sweat) each week. In addition, most adults need muscle-strengthening exercises on 2 or more days a week.  Maintain a healthy weight. The body mass index (BMI) is a screening tool to identify possible weight problems. It provides an estimate of body fat based on height and weight. Your health care provider can find your BMI and can help you achieve or maintain a healthy weight.For adults 20 years and older:  A BMI below 18.5 is considered underweight.  A BMI of 18.5 to 24.9 is normal.  A BMI of 25 to 29.9 is considered overweight.  A BMI of 30 and above is considered obese.  Maintain normal blood lipids and cholesterol levels by exercising and minimizing your intake of saturated fat. Eat a balanced diet with plenty of fruit and vegetables. Blood tests for lipids and cholesterol should begin at age 59 and be repeated every 5 years. If your lipid or cholesterol levels are high, you are over 50, or you are at high risk for heart disease, you may need your cholesterol levels checked more frequently.Ongoing high lipid and cholesterol levels should be treated with medicines if diet and exercise are not working.  If you smoke, find out from your  health care provider how to quit. If you do not use tobacco, do not start.  Lung cancer screening is recommended for adults aged 55-80 years who are at high risk for developing lung cancer because of a history of smoking. A yearly low-dose CT scan of the lungs is recommended for people who have at least a 30-pack-year history of smoking and are a current smoker or have quit within the past 15 years. A pack year of smoking is smoking an average of 1 pack of cigarettes a day for 1 year (for example: 1 pack a day for 30 years or 2 packs a day for 15 years). Yearly screening should continue until the smoker has stopped smoking for at least 15 years. Yearly screening should be stopped for people who develop a health problem that would prevent them from having lung cancer treatment.  If you choose to drink alcohol, do not have more than 2 drinks per day.  One drink is considered to be 12 ounces (355 mL) of beer, 5 ounces (148 mL) of wine, or 1.5 ounces (44 mL) of liquor.  Avoid use of street drugs. Do not share needles with anyone. Ask for help if you need support or instructions about stopping the use of drugs.  High blood pressure causes heart disease and increases the risk of stroke. Your blood pressure should be checked at least every 1-2 years. Ongoing high blood pressure should be treated with medicines, if weight loss and exercise are not effective.  If you are 83-45 years old, ask your health care provider if you should take aspirin to prevent heart disease.  Diabetes screening involves taking a blood sample to check your fasting blood sugar level. Testing should be considered at a younger age or be carried out more frequently if you are overweight and have at least 1 risk factor for diabetes.  Colorectal cancer can be detected and often prevented. Most routine colorectal cancer screening begins at the age of 34 and continues through age 81. However, your health care provider may recommend screening  at an earlier age if you have risk factors for colon cancer. On a yearly basis, your health care provider may provide home test kits to check for hidden blood in the stool. Use of a small camera at the end of a tube to directly examine the colon (sigmoidoscopy or colonoscopy) can detect the earliest forms of colorectal cancer. Talk to your health care provider about this at age 33, when routine screening begins. Direct exam of the colon should be repeated every 5-10 years through age 68, unless early forms of precancerous polyps or small growths are found.  Hepatitis C blood testing is recommended for all people born from [birth year] through 1965 and any individual with known risks for hepatitis C.  New guidelines recommend a once time screening for HIV.   Screening for abdominal aortic aneurysm (AAA)  by ultrasound is recommended for people who have history of high blood pressure or who are current or former smokers.  Healthy men should  receive prostate-specific antigen (PSA) blood tests as part of routine cancer screening. Talk with your health care provider about prostate cancer screening.  Testicular cancer screening is  recommended for adult males. Screening includes self-exam, a health care provider exam, and other screening tests. Consult with your health care provider about any symptoms you have or any concerns you have about testicular cancer.  Use sunscreen. Apply sunscreen liberally and repeatedly throughout the day. You should seek shade when your shadow is shorter than you. Protect yourself by wearing long sleeves, pants, a wide-brimmed hat, and sunglasses year round, whenever you are outdoors.  Once a month, do a whole-body skin exam, using a mirror to look at the skin on your back. Tell your health care provider about new moles, moles that have irregular borders, moles that are larger than a pencil eraser, or moles that have changed in shape or color.  Stay current with required vaccines  (immunizations).  Influenza vaccine. All adults should be immunized every year.  Tetanus, diphtheria, and acellular pertussis (Td, Tdap) vaccine. An adult who has not previously received Tdap or who does not know his vaccine status should receive 1 dose of Tdap. This initial dose should be followed by tetanus and diphtheria toxoids (Td) booster doses every 10 years. Adults with an unknown or incomplete history of completing a 3-dose immunization series with Td-containing vaccines should begin or complete a primary immunization  series including a Tdap dose. Adults should receive a Td booster every 10 years.  Zoster vaccine. One dose is recommended for adults aged 58 years or older unless certain conditions are present.    PREVNAR - Pneumococcal 13-valent conjugate (PCV13) vaccine. When indicated, a person who is uncertain of his immunization history and has no record of immunization should receive the PCV13 vaccine. An adult aged 56 years or older who has certain medical conditions and has not been previously immunized should receive 1 dose of PCV13 vaccine. This PCV13 should be followed with a dose of pneumococcal polysaccharide (PPSV23) vaccine. The PPSV23 vaccine dose should be obtained at least 8 weeks after the dose of PCV13 vaccine. An adult aged 48 years or older who has certain medical conditions and previously received 1 or more doses of PPSV23 vaccine should receive 1 dose of PCV13. The PCV13 vaccine dose should be obtained 1 or more years after the last PPSV23 vaccine dose.    PNEUMOVAX - Pneumococcal polysaccharide (PPSV23) vaccine. When PCV13 is also indicated, PCV13 should be obtained first. All adults aged 61 years and older should be immunized. An adult younger than age 57 years who has certain medical conditions should be immunized. Any person who resides in a nursing home or long-term care facility should be immunized. An adult smoker should be immunized. People with an  immunocompromised condition and certain other conditions should receive both PCV13 and PPSV23 vaccines. People with human immunodeficiency virus (HIV) infection should be immunized as soon as possible after diagnosis. Immunization during chemotherapy or radiation therapy should be avoided. Routine use of PPSV23 vaccine is not recommended for American Indians, [address], or people younger than 65 years unless there are medical conditions that require PPSV23 vaccine. When indicated, people who have unknown immunization and have no record of immunization should receive PPSV23 vaccine. One-time revaccination 5 years after the first dose of PPSV23 is recommended for people aged 19-64 years who have chronic kidney failure, nephrotic syndrome, asplenia, or immunocompromised conditions. People who received 1-2 doses of PPSV23 before age 58 years should receive another dose of PPSV23 vaccine at age 44 years or later if at least 5 years have passed since the previous dose. Doses of PPSV23 are not needed for people immunized with PPSV23 at or after age 41 years.    Hepatitis A vaccine. Adults who wish to be protected from this disease, have certain high-risk conditions, work with hepatitis A-infected animals, work in hepatitis A research labs, or travel to or work in countries with a high rate of hepatitis A should be immunized. Adults who were previously unvaccinated and who anticipate close contact with an international adoptee during the first 60 days after arrival in the [place] from a country with a high rate of hepatitis A should be immunized.    Hepatitis B vaccine. Adults should be immunized if they wish to be protected from this disease, have certain high-risk conditions, may be exposed to blood or other infectious body fluids, are household contacts or sex partners of hepatitis B positive people, are clients or workers in certain care facilities, or travel to or work in countries with a high rate  of hepatitis B.   Preventive Service / Frequency   Ages 64 and over  Blood pressure check.  Lipid and cholesterol check.  Lung cancer screening. / Every year if you are aged 55-80 years and have a 30-pack-year history of smoking and currently smoke or have quit within the past 15  years. Yearly screening is stopped once you have quit smoking for at least 15 years or develop a health problem that would prevent you from having lung cancer treatment.  Fecal occult blood test (FOBT) of stool. You may not have to do this test if you get a colonoscopy every 10 years.  Flexible sigmoidoscopy** or colonoscopy.** / Every 5 years for a flexible sigmoidoscopy or every 10 years for a colonoscopy beginning at age 44 and continuing until age 92.  Hepatitis C blood test.** / For all people born from [birth year] through 1965 and any individual with known risks for hepatitis C.  Abdominal aortic aneurysm (AAA) screening./ Screening current or former smokers or have Hypertension.  Skin self-exam. / Monthly.  Influenza vaccine. / Every year.  Tetanus, diphtheria, and acellular pertussis (Tdap/Td) vaccine.** / 1 dose of Td every 10 years.   Zoster vaccine.** / 1 dose for adults aged 10 years or older.         Pneumococcal 13-valent conjugate (PCV13) vaccine.    Pneumococcal polysaccharide (PPSV23) vaccine.     Hepatitis A vaccine.** / Consult your health care provider.  Hepatitis B vaccine.** / Consult your health care provider. Screening for abdominal aortic aneurysm (AAA)  by ultrasound is recommended for people who have history of high blood pressure or who are current or former smokers.

## 2015-01-24 LAB — VITAMIN D 25 HYDROXY (VIT D DEFICIENCY, FRACTURES): Vit D, 25-Hydroxy: 61 ng/mL (ref 30–100)

## 2015-01-25 ENCOUNTER — Other Ambulatory Visit: Payer: Self-pay

## 2015-01-25 MED ORDER — CANAGLIFLOZIN 300 MG PO TABS: 300.0000 mg | ORAL_TABLET | Freq: Every day | ORAL | Status: DC

## 2015-02-13 ENCOUNTER — Other Ambulatory Visit: Payer: Self-pay

## 2015-02-13 MED ORDER — SIMVASTATIN 40 MG PO TABS
ORAL_TABLET | ORAL | Status: DC
Start: 2015-02-13 — End: 2015-02-20

## 2015-02-14 DIAGNOSIS — H11153 Pinguecula, bilateral: Secondary | ICD-10-CM | POA: Diagnosis not present

## 2015-02-14 DIAGNOSIS — H5203 Hypermetropia, bilateral: Secondary | ICD-10-CM | POA: Diagnosis not present

## 2015-02-14 DIAGNOSIS — H524 Presbyopia: Secondary | ICD-10-CM | POA: Diagnosis not present

## 2015-02-14 DIAGNOSIS — H52223 Regular astigmatism, bilateral: Secondary | ICD-10-CM | POA: Diagnosis not present

## 2015-02-14 DIAGNOSIS — G4733 Obstructive sleep apnea (adult) (pediatric): Secondary | ICD-10-CM | POA: Diagnosis not present

## 2015-02-14 DIAGNOSIS — H35363 Drusen (degenerative) of macula, bilateral: Secondary | ICD-10-CM | POA: Diagnosis not present

## 2015-02-14 DIAGNOSIS — E119 Type 2 diabetes mellitus without complications: Secondary | ICD-10-CM | POA: Diagnosis not present

## 2015-02-14 DIAGNOSIS — I10 Essential (primary) hypertension: Secondary | ICD-10-CM | POA: Diagnosis not present

## 2015-02-20 ENCOUNTER — Other Ambulatory Visit: Payer: Self-pay

## 2015-02-20 MED ORDER — PRAVASTATIN SODIUM 40 MG PO TABS: 40.0000 mg | ORAL_TABLET | Freq: Every day | ORAL | Status: DC

## 2015-02-21 ENCOUNTER — Other Ambulatory Visit: Payer: Self-pay | Admitting: *Deleted

## 2015-02-21 ENCOUNTER — Encounter: Payer: Self-pay | Admitting: Emergency Medicine

## 2015-02-21 MED ORDER — PRAVASTATIN SODIUM 40 MG PO TABS: 40.0000 mg | ORAL_TABLET | Freq: Every day | ORAL | Status: DC

## 2015-02-22 ENCOUNTER — Encounter: Payer: Self-pay | Admitting: Internal Medicine

## 2015-02-27 DIAGNOSIS — I10 Essential (primary) hypertension: Secondary | ICD-10-CM | POA: Diagnosis not present

## 2015-02-27 DIAGNOSIS — G4733 Obstructive sleep apnea (adult) (pediatric): Secondary | ICD-10-CM | POA: Diagnosis not present

## 2015-03-03 DIAGNOSIS — Z0100 Encounter for examination of eyes and vision without abnormal findings: Secondary | ICD-10-CM | POA: Diagnosis not present

## 2015-03-03 DIAGNOSIS — H5203 Hypermetropia, bilateral: Secondary | ICD-10-CM | POA: Diagnosis not present

## 2015-03-03 DIAGNOSIS — H52209 Unspecified astigmatism, unspecified eye: Secondary | ICD-10-CM | POA: Diagnosis not present

## 2015-03-03 DIAGNOSIS — H524 Presbyopia: Secondary | ICD-10-CM | POA: Diagnosis not present

## 2015-03-05 ENCOUNTER — Ambulatory Visit: Payer: BC Managed Care – PPO | Admitting: Podiatry

## 2015-03-06 ENCOUNTER — Ambulatory Visit: Payer: BC Managed Care – PPO | Admitting: Podiatry

## 2015-03-08 IMAGING — US US AORTA
1 series · 13 of 13 positions shown · non-contrast
Comparison: None.

CLINICAL DATA: Abnormal aorta scan.  Rule out aneurysm.

EXAM:
ULTRASOUND OF ABDOMINAL AORTA
TECHNIQUE: Ultrasound examination of the abdominal aorta was performed to
evaluate for abdominal aortic aneurysm.

[Series 1: us aorta · 0.31mm/px · 13 of 13 slices shown]
[im 1/13]
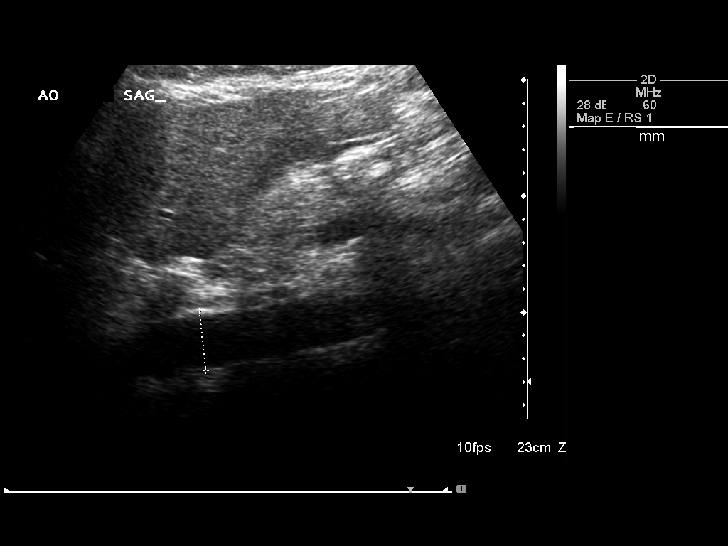
[im 2/13]
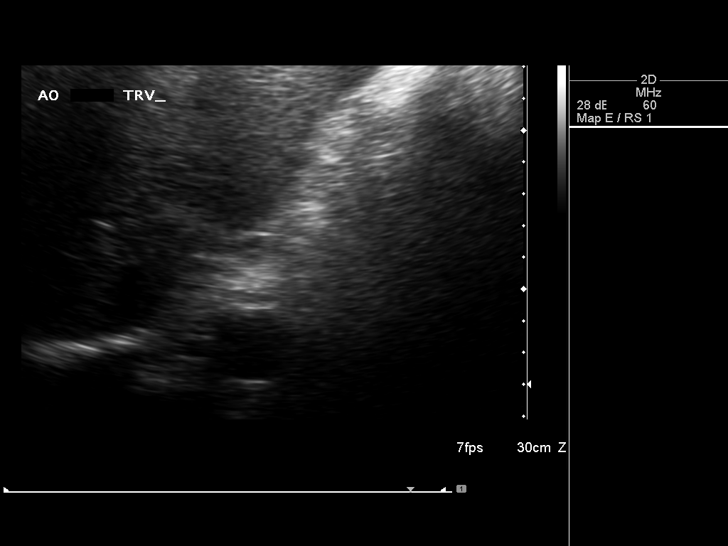
[im 3/13]
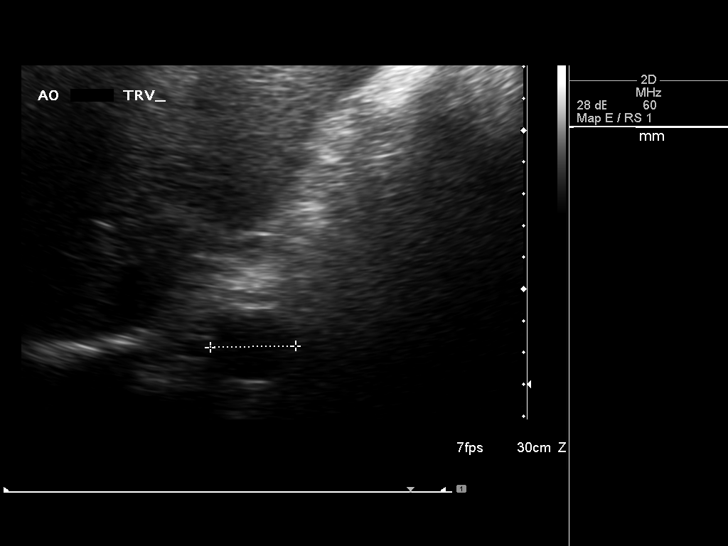
[im 4/13]
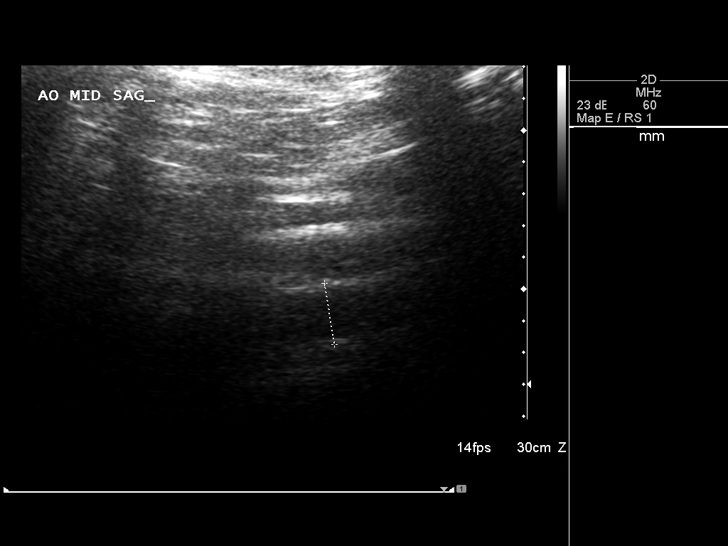
[im 5/13]
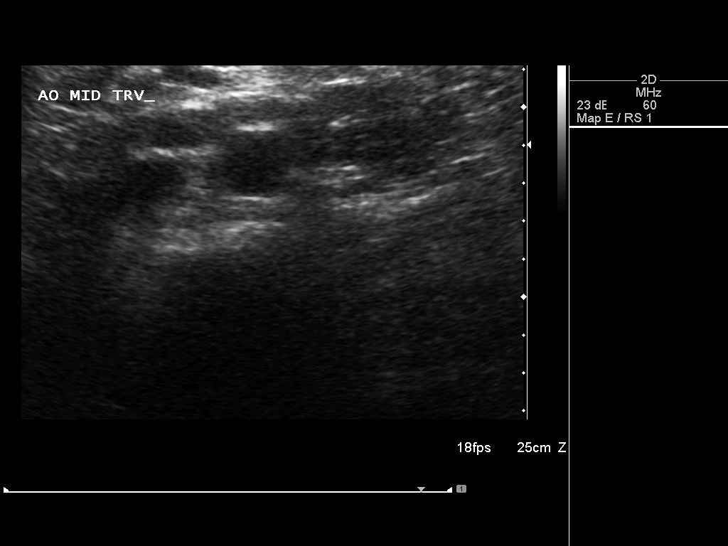
[im 6/13]
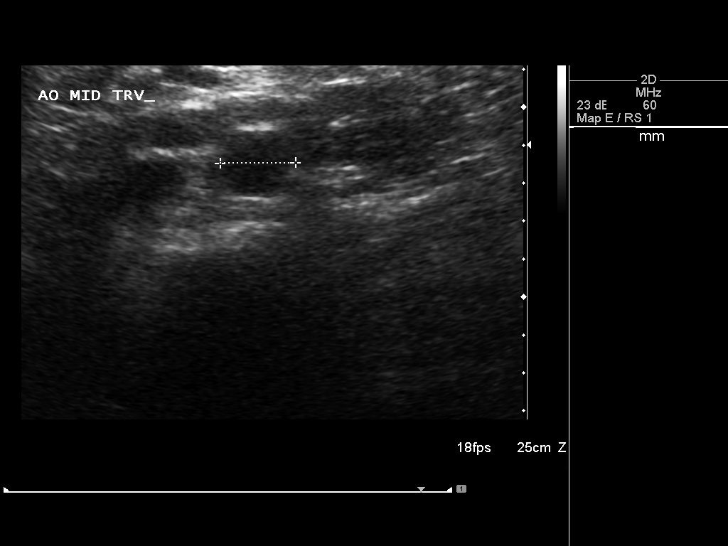
[im 7/13]
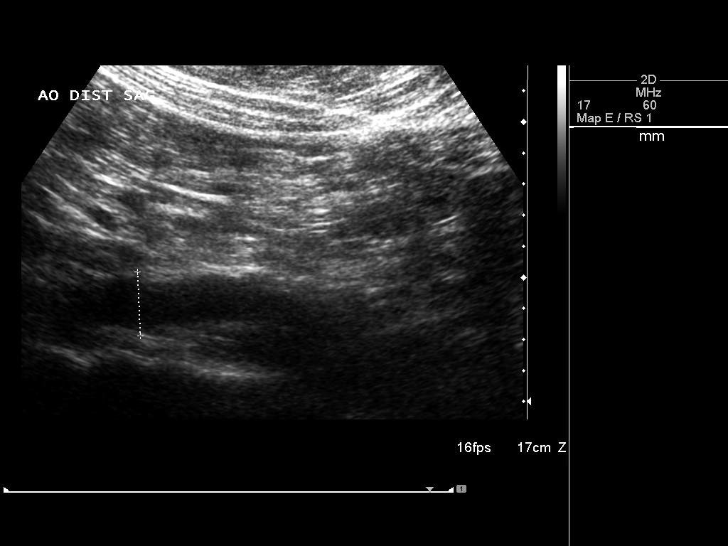
[im 8/13]
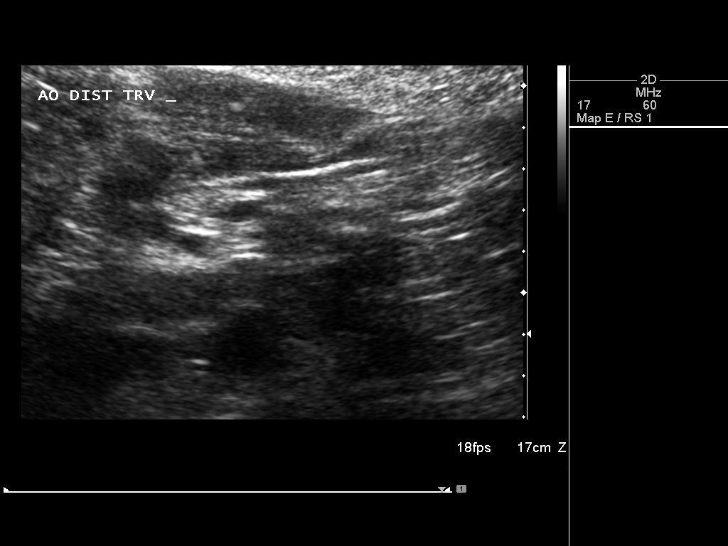
[im 9/13]
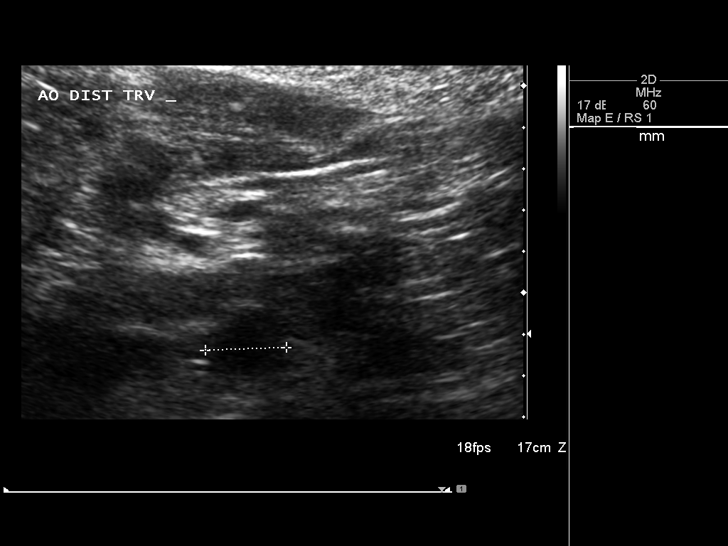
[im 10/13]
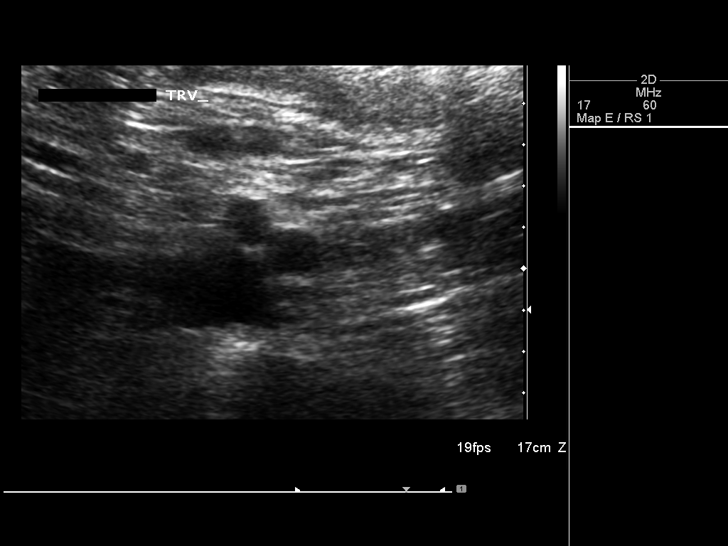
[im 11/13]
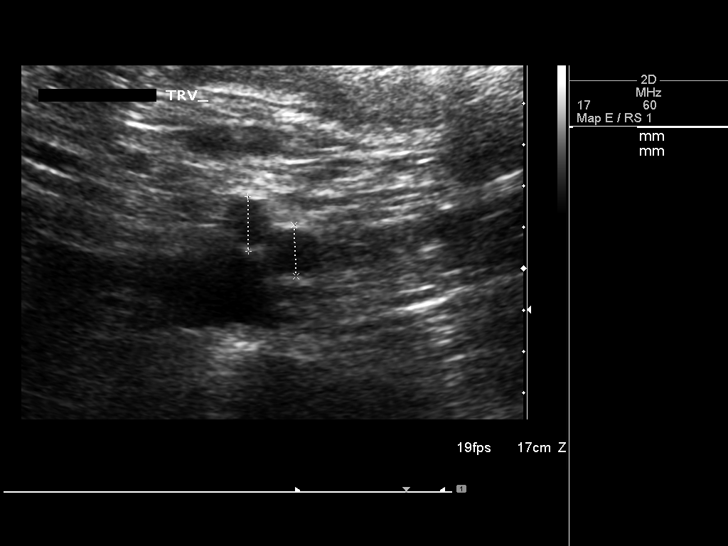
[im 12/13]
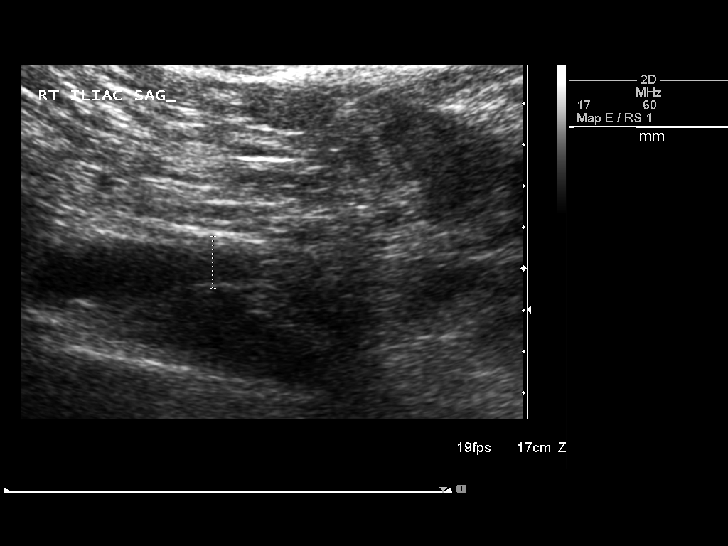
[im 13/13]
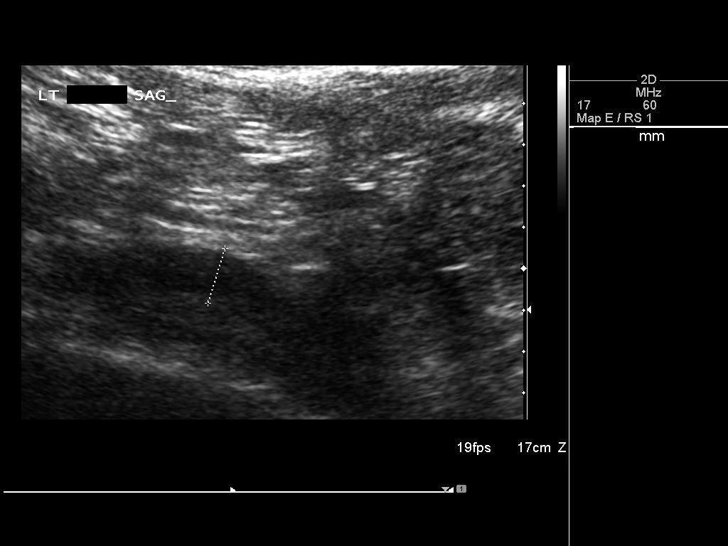

[13 of 13 positions shown; findings below may reference images not displayed]

FINDINGS: Abdominal Aorta

No evidence for aneurysm.

Maximum AP

Diameter:  2.6 cm

Maximum TRV

Diameter: 2.7 cm

Right common iliac artery measures 1.3 cm in maximum diameter. Left
common iliac artery measures 1.4 cm in maximum diameter.
IMPRESSION: No abdominal aortic aneurysm.

Mild aneurysmal dilatation of both common iliac arteries.

## 2015-03-15 DIAGNOSIS — G4733 Obstructive sleep apnea (adult) (pediatric): Secondary | ICD-10-CM | POA: Diagnosis not present

## 2015-04-03 ENCOUNTER — Encounter: Payer: Self-pay | Admitting: Podiatry

## 2015-04-03 ENCOUNTER — Ambulatory Visit (INDEPENDENT_AMBULATORY_CARE_PROVIDER_SITE_OTHER): Payer: Commercial Managed Care - HMO | Admitting: Podiatry

## 2015-04-03 VITALS — BP 146/82 | HR 90 | Resp 12

## 2015-04-03 DIAGNOSIS — E119 Type 2 diabetes mellitus without complications: Secondary | ICD-10-CM | POA: Diagnosis not present

## 2015-04-03 NOTE — Progress Notes (Signed)
   Subjective:    Patient ID: [name] [name], male    DOB: 1956/06/09, 59 y.o.   MRN: [id number]  HPI NThis patient presents today requesting a generalized diabetic foot examination. At this time patient.denies any history of foot ulceration, claudication, since the visit 03/06/2014. He says that approximately 1 months ago he had some nighttime episodes of tingling that radiated from his hips into his feet bilaterally, however, the symptoms have stopped. He denies any tingling, burning, or numbness in his feet are in her ankles.  Review of Systems  Neurological: Positive for headaches.  Hematological: Bruises/bleeds easily.       Objective:   Physical Exam Orientated 3  Vascular: No peripheral edema noted bilaterally No calf pain or calf edema noted bilaterally DP and PT pulses 2/4 bilaterally Capillary refill immediate bilaterally  Neurological: Sensation to 10 g monofilament wire intact 5/5 bilaterally Vibratory sensation reactive bilaterally Ankle reflexes equal and reactive bilaterally  Dermatological: Texture and turgor within normal limits bilaterally Toenails are normal trophic 10 No skin lesions are noted bilaterally No open wounds noted bilaterally  Musculoskeletal: No deformities noted bilaterally Rectus foot type bilaterally There is no restriction ankle, subtalar, midtarsal joints bilaterally         Assessment & Plan:   Assessment: Satisfactory neurovascular status Diabetic without complications  Plan: I reviewed the results of examination with patient today. I made him aware that is transient tingling from his hips and feet most likely was not associated with his diabetic condition and if the symptoms persist to have his hips back evaluated. Also, made him wear that his general diabetic foot examination was satisfactory without any problems noted today. Generalized information about diabetic foot care provided to patient and I recommended return as  needed or at yearly intervals for diabetic foot examination

## 2015-04-03 NOTE — Patient Instructions (Signed)
Diabetes and Foot Care Diabetes may cause you to have problems because of poor blood supply (circulation) to your feet and legs. This may cause the skin on your feet to become thinner, break easier, and heal more slowly. Your skin may become dry, and the skin may peel and crack. You may also have nerve damage in your legs and feet causing decreased feeling in them. You may not notice minor injuries to your feet that could lead to infections or more serious problems. Taking care of your feet is one of the most important things you can do for yourself.  HOME CARE INSTRUCTIONS  Wear shoes at all times, even in the house. Do not go barefoot. Bare feet are easily injured.  Check your feet daily for blisters, cuts, and redness. If you cannot see the bottom of your feet, use a mirror or ask someone for help.  Wash your feet with warm water (do not use hot water) and mild soap. Then pat your feet and the areas between your toes until they are completely dry. Do not soak your feet as this can dry your skin.  Apply a moisturizing lotion or petroleum jelly (that does not contain alcohol and is unscented) to the skin on your feet and to dry, brittle toenails. Do not apply lotion between your toes.  Trim your toenails straight across. Do not dig under them or around the cuticle. File the edges of your nails with an emery board or nail file.  Do not cut corns or calluses or try to remove them with medicine.  Wear clean socks or stockings every day. Make sure they are not too tight. Do not wear knee-high stockings since they may decrease blood flow to your legs.  Wear shoes that fit properly and have enough cushioning. To break in new shoes, wear them for just a few hours a day. This prevents you from injuring your feet. Always look in your shoes before you put them on to be sure there are no objects inside.  Do not cross your legs. This may decrease the blood flow to your feet.  If you find a minor scrape,  cut, or break in the skin on your feet, keep it and the skin around it clean and dry. These areas may be cleansed with mild soap and water. Do not cleanse the area with peroxide, alcohol, or iodine.  When you remove an adhesive bandage, be sure not to damage the skin around it.  If you have a wound, look at it several times a day to make sure it is healing.  Do not use heating pads or hot water bottles. They may burn your skin. If you have lost feeling in your feet or legs, you may not know it is happening until it is too late.  Make sure your health care provider performs a complete foot exam at least annually or more often if you have foot problems. Report any cuts, sores, or bruises to your health care provider immediately. SEEK MEDICAL CARE IF:   You have an injury that is not healing.  You have cuts or breaks in the skin.  You have an ingrown nail.  You notice redness on your legs or feet.  You feel burning or tingling in your legs or feet.  You have pain or cramps in your legs and feet.  Your legs or feet are numb.  Your feet always feel cold. SEEK IMMEDIATE MEDICAL CARE IF:   There is increasing redness,   swelling, or pain in or around a wound.  There is a red line that goes up your leg.  Pus is coming from a wound.  You develop a fever or as directed by your health care provider.  You notice a bad smell coming from an ulcer or wound. Document Released: 08/01/2000 Document Revised: 04/06/2013 Document Reviewed: 01/11/2013 [employer] Patient Information 2015 [employer]. This information is not intended to replace advice given to you by your health care provider. Make sure you discuss any questions you have with your health care provider.  

## 2015-04-15 DIAGNOSIS — G4733 Obstructive sleep apnea (adult) (pediatric): Secondary | ICD-10-CM | POA: Diagnosis not present

## 2015-05-07 ENCOUNTER — Encounter: Payer: Self-pay | Admitting: Internal Medicine

## 2015-05-07 ENCOUNTER — Ambulatory Visit (INDEPENDENT_AMBULATORY_CARE_PROVIDER_SITE_OTHER): Payer: Commercial Managed Care - HMO | Admitting: Internal Medicine

## 2015-05-07 VITALS — BP 134/82 | HR 84 | Temp 97.3°F | Resp 16 | Ht 69.0 in | Wt 305.4 lb

## 2015-05-07 DIAGNOSIS — E1122 Type 2 diabetes mellitus with diabetic chronic kidney disease: Secondary | ICD-10-CM

## 2015-05-07 DIAGNOSIS — K219 Gastro-esophageal reflux disease without esophagitis: Secondary | ICD-10-CM | POA: Diagnosis not present

## 2015-05-07 DIAGNOSIS — Z1331 Encounter for screening for depression: Secondary | ICD-10-CM

## 2015-05-07 DIAGNOSIS — E1149 Type 2 diabetes mellitus with other diabetic neurological complication: Secondary | ICD-10-CM | POA: Diagnosis not present

## 2015-05-07 DIAGNOSIS — E559 Vitamin D deficiency, unspecified: Secondary | ICD-10-CM

## 2015-05-07 DIAGNOSIS — I10 Essential (primary) hypertension: Secondary | ICD-10-CM

## 2015-05-07 DIAGNOSIS — E1129 Type 2 diabetes mellitus with other diabetic kidney complication: Secondary | ICD-10-CM | POA: Diagnosis not present

## 2015-05-07 DIAGNOSIS — Z1212 Encounter for screening for malignant neoplasm of rectum: Secondary | ICD-10-CM | POA: Diagnosis not present

## 2015-05-07 DIAGNOSIS — Z79899 Other long term (current) drug therapy: Secondary | ICD-10-CM | POA: Diagnosis not present

## 2015-05-07 DIAGNOSIS — N189 Chronic kidney disease, unspecified: Secondary | ICD-10-CM | POA: Diagnosis not present

## 2015-05-07 DIAGNOSIS — Z789 Other specified health status: Secondary | ICD-10-CM | POA: Diagnosis not present

## 2015-05-07 DIAGNOSIS — Z1389 Encounter for screening for other disorder: Secondary | ICD-10-CM | POA: Diagnosis not present

## 2015-05-07 DIAGNOSIS — E1140 Type 2 diabetes mellitus with diabetic neuropathy, unspecified: Secondary | ICD-10-CM

## 2015-05-07 DIAGNOSIS — E782 Mixed hyperlipidemia: Secondary | ICD-10-CM

## 2015-05-07 DIAGNOSIS — Z9181 History of falling: Secondary | ICD-10-CM

## 2015-05-07 DIAGNOSIS — E1142 Type 2 diabetes mellitus with diabetic polyneuropathy: Secondary | ICD-10-CM

## 2015-05-07 DIAGNOSIS — Z125 Encounter for screening for malignant neoplasm of prostate: Secondary | ICD-10-CM

## 2015-05-07 DIAGNOSIS — R5383 Other fatigue: Secondary | ICD-10-CM | POA: Diagnosis not present

## 2015-05-07 NOTE — Patient Instructions (Signed)

## 2015-05-08 LAB — HEMOGLOBIN A1C
Hgb A1c MFr Bld: 7 % — ABNORMAL HIGH (ref <5.7)
Mean Plasma Glucose: 154 mg/dL — ABNORMAL HIGH (ref <117)

## 2015-05-08 LAB — IRON AND TIBC
%SAT: 19 % (ref 15–60)
Iron: 69 ug/dL (ref 50–180)
TIBC: 358 ug/dL (ref 250–425)
UIBC: 289 ug/dL (ref 125–400)

## 2015-05-08 LAB — BASIC METABOLIC PANEL WITH GFR
BUN: 18 mg/dL (ref 7–25)
CO2: 28 mmol/L (ref 20–31)
Calcium: 9.7 mg/dL (ref 8.6–10.3)
Chloride: 100 mmol/L (ref 98–110)
Creat: 1.1 mg/dL (ref 0.70–1.33)
GFR, Est African American: 84 mL/min (ref >=60)
GFR, Est Non African American: 73 mL/min (ref >=60)
Glucose, Bld: 83 mg/dL (ref 65–99)
Potassium: 4.3 mmol/L (ref 3.5–5.3)
Sodium: 140 mmol/L (ref 135–146)

## 2015-05-08 LAB — CBC WITH DIFFERENTIAL/PLATELET
Basophils Absolute: 0.1 10*3/uL (ref 0.0–0.1)
Basophils Relative: 1 % (ref 0–1)
Eosinophils Absolute: 0.2 10*3/uL (ref 0.0–0.7)
Eosinophils Relative: 4 % (ref 0–5)
HCT: 44.9 % (ref 39.0–52.0)
Hemoglobin: 14.7 g/dL (ref 13.0–17.0)
Lymphocytes Relative: 47 % — ABNORMAL HIGH (ref 12–46)
Lymphs Abs: 2.9 10*3/uL (ref 0.7–4.0)
MCH: 26.7 pg (ref 26.0–34.0)
MCHC: 32.7 g/dL (ref 30.0–36.0)
MCV: 81.6 fL (ref 78.0–100.0)
MPV: 8.6 fL (ref 8.6–12.4)
Monocytes Absolute: 0.7 10*3/uL (ref 0.1–1.0)
Monocytes Relative: 11 % (ref 3–12)
Neutro Abs: 2.3 10*3/uL (ref 1.7–7.7)
Neutrophils Relative %: 37 % — ABNORMAL LOW (ref 43–77)
Platelets: 271 10*3/uL (ref 150–400)
RBC: 5.5 MIL/uL (ref 4.22–5.81)
RDW: 14.5 % (ref 11.5–15.5)
WBC: 6.2 10*3/uL (ref 4.0–10.5)

## 2015-05-08 LAB — MICROALBUMIN / CREATININE URINE RATIO
Creatinine, Urine: 110.6 mg/dL
Microalb Creat Ratio: 31.6 mg/g — ABNORMAL HIGH (ref 0.0–30.0)
Microalb, Ur: 3.5 mg/dL — ABNORMAL HIGH (ref <2.0)

## 2015-05-08 LAB — URIC ACID: Uric Acid, Serum: 5 mg/dL (ref 4.0–7.8)

## 2015-05-08 LAB — INSULIN, RANDOM: Insulin: 14.2 u[IU]/mL (ref 2.0–19.6)

## 2015-05-08 LAB — TESTOSTERONE: Testosterone: 170 ng/dL — ABNORMAL LOW (ref 300–890)

## 2015-05-08 LAB — VITAMIN B12: Vitamin B-12: 944 pg/mL — ABNORMAL HIGH (ref 211–911)

## 2015-05-08 LAB — LIPID PANEL
Cholesterol: 197 mg/dL (ref 125–200)
HDL: 53 mg/dL (ref >=40)
LDL Cholesterol: 128 mg/dL (ref <130)
Total CHOL/HDL Ratio: 3.7 Ratio (ref <=5.0)
Triglycerides: 81 mg/dL (ref <150)
VLDL: 16 mg/dL (ref <30)

## 2015-05-08 LAB — HEPATIC FUNCTION PANEL
ALT: 10 U/L (ref 9–46)
AST: 19 U/L (ref 10–35)
Albumin: 4.4 g/dL (ref 3.6–5.1)
Alkaline Phosphatase: 73 U/L (ref 40–115)
Bilirubin, Direct: 0.1 mg/dL (ref <=0.2)
Indirect Bilirubin: 0.3 mg/dL (ref 0.2–1.2)
Total Bilirubin: 0.4 mg/dL (ref 0.2–1.2)
Total Protein: 8.2 g/dL — ABNORMAL HIGH (ref 6.1–8.1)

## 2015-05-08 LAB — MAGNESIUM: Magnesium: 2.1 mg/dL (ref 1.5–2.5)

## 2015-05-08 LAB — TSH: TSH: 1.445 u[IU]/mL (ref 0.350–4.500)

## 2015-05-08 LAB — VITAMIN D 25 HYDROXY (VIT D DEFICIENCY, FRACTURES): Vit D, 25-Hydroxy: 62 ng/mL (ref 30–100)

## 2015-05-08 LAB — PSA: PSA: 0.67 ng/mL (ref <=4.00)

## 2015-05-13 NOTE — Progress Notes (Signed)
Patient ID: [name] [name], male   DOB: January 29, 1956, 59 y.o.   MRN: [id number]   Comprehensive Examination  This very nice 59 y.o. single BM presents for comprehensive evaluation, examination and management of multiple medical co-morbidities.  Patient has been followed for HTN, T2_NIDDM w/complications, Hyperlipidemia, GERD and Vitamin D Deficiency. Patient also has OSA and is on CPAP alleging compliance with use and reporting improved sleep hygiene and less daytime fatigue.    HTN predates circa 2010. Patient's BP has been controlled at home.Today's BP: 134/82 mmHg. Patient denies any cardiac symptoms as chest pain, palpitations, shortness of breath, dizziness or ankle swelling.   Patient's hyperlipidemia is controlled with diet and medications. Patient denies myalgias or other medication SE's. Today's  lipids are not at goal - Cholesterol 197; HDL 53; LDL 128; Triglycerides 81.     Patient has severe Morbid Obesity (BMI 45+) and consequent T2_NIDDM since 2013 and patient denies reactive hypoglycemic symptoms, visual blurring, diabetic polys, but does have decreased sensation and paresthesias of his feet.  In 2014 he has hospitalized with Hemiballismus due to non-ketotic hyperosmolar hyperglycemia with glucose > 1000 mg%.  He also has CKD 2 (GFR 77 ml/min) attributed to his Diabetes. Patient is not dietary compliant. Today's  A1c is not at goal at 7.0%. 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    Finally, patient has history of Vitamin D Deficiency  and last vitamin D was 05/07/2015: Vit D, 25-Hydroxy 62 on       Medication Sig  . canagliflozin (INVOKANA) 300 MG  Take 300 mg by mouth daily before breakfast.  . CARTIA XT 120 MG 24 hr cap TAKE 1 CAPSULE DAILY FOR BLOOD PRESSURE  . VITAMIN D 5000 UNITS  Take 1 tablet by mouth daily.  Marland Kitchen glipiZIDE  5 MG tablet Take 5 mg by mouth as needed. Patient takes when blood  sugar is over 160  . losartan (COZAAR) 100 MG tablet TAKE 1 TABLET DAILY FOR BLOOD PRESSURE AND DIABETIC KIDNEY PROTECTION.  . Magnesium-Zinc ) Take 400 mg by mouth daily.   . metFORMIN-XR 500 MG 24 hr tablet Take 2 tablets 2 x day with meals for Diabetes  . Multiple Vitamins-Minerals  Take 1 tablet by mouth daily.  . pravastatin  40 MG tablet Take 1 tablet (40 mg total) by mouth daily.  . fish oil 1000 MG capsule Take 500 mg by mouth daily.    No Known Allergies   Past Medical History  Diagnosis Date  . Vitamin D deficiency    Health Maintenance  Topic Date Due  . Hepatitis C Screening  [birth year]  . HIV Screening  11/17/1970  . OPHTHALMOLOGY EXAM  10/21/2014  . HEMOGLOBIN A1C  11/04/2015  . FOOT EXAM  05/06/2016  . URINE MICROALBUMIN  05/06/2016  . PNEUMOCOCCAL POLYSACCHARIDE VACCINE (2) 02/16/2018  . TETANUS/TDAP  02/09/2021  . COLONOSCOPY  03/10/2022   Immunization History  Administered Date(s) Administered  . Pneumococcal-Unspecified 02/16/2013  . Tdap 02/10/2011   Past Surgical History  Procedure Laterality Date  . Carpal tunnel release  2008    bilateral  . Knee arthroscopy  2010    left  . Cervical discectomy  2003   Family History  Problem Relation Age of Onset  . Colon cancer Neg Hx   . Stomach cancer Neg Hx   . Diabetes Father   . Hypertension Father   . Diabetes Sister   .  Kidney disease Sister   . Heart disease Mother   . Stroke Mother   . Diabetes Mother    Social History   Social History  . Marital Status: Single    Spouse Name: N/A  . Number of Children: N/A  . Years of Education: N/A   Occupational History  . On SS Disabilitty since 2015.    Social History Main Topics  . Smoking status: Former Smoker    Quit date: 02/25/1991  . Smokeless tobacco: Never Used  . Alcohol Use: No  . Drug Use: No  . Sexual Activity: Not on file   Other Topics Concern  . Not on file   Social History Narrative    ROS Constitutional: Denies fever,  chills, weight loss/gain, headaches, insomnia,  night sweats or change in appetite. Does c/o fatigue. Eyes: Denies redness, blurred vision, diplopia, discharge, itchy or watery eyes.  ENT: Denies discharge, congestion, post nasal drip, epistaxis, sore throat, earache, hearing loss, dental pain, Tinnitus, Vertigo, Sinus pain or snoring.  Cardio: Denies chest pain, palpitations, irregular heartbeat, syncope, dyspnea, diaphoresis, orthopnea, PND, claudication or edema Respiratory: denies cough, dyspnea, DOE, pleurisy, hoarseness, laryngitis or wheezing.  Gastrointestinal: Denies dysphagia, heartburn, reflux, water brash, pain, cramps, nausea, vomiting, bloating, diarrhea, constipation, hematemesis, melena, hematochezia, jaundice or hemorrhoids Genitourinary: Denies dysuria, frequency, urgency, nocturia, hesitancy, discharge, hematuria or flank pain Musculoskeletal: Denies arthralgia, myalgia, stiffness, Jt. Swelling, pain, limp or strain/sprain. Denies Falls. Skin: Denies puritis, rash, hives, warts, acne, eczema or change in skin lesion Neuro: No weakness, tremor, incoordination, spasms, paresthesia or pain Psychiatric: Denies confusion, memory loss or sensory loss. Denies Depression. Endocrine: Denies change in weight, skin, hair change, nocturia, and paresthesia, diabetic polys, visual blurring or hyper / hypo glycemic episodes.  Heme/Lymph: No excessive bleeding, bruising or enlarged lymph nodes.  Physical Exam  BP 134/82   Pulse 84  Temp 97.3 F   Resp 16  Ht    Wt 305 lb 6.4 oz     BMI 45.08  General Appearance: Well nourished &  in no apparent distress. Eyes: PERRLA, EOMs, conjunctiva no swelling or erythema, normal fundi and vessels. Sinuses: No frontal/maxillary tenderness ENT/Mouth: EACs patent / TMs  nl. Nares clear without erythema, swelling, mucoid exudates. Oral hygiene is good. No erythema, swelling, or exudate. Tongue normal, non-obstructing. Tonsils not swollen or  erythematous. Hearing normal.  Neck: Supple, thyroid normal. No bruits, nodes or JVD. Respiratory: Respiratory effort normal.  BS equal and clear bilateral without rales, rhonci, wheezing or stridor. Cardio: Heart sounds are normal with regular rate and rhythm and no murmurs, rubs or gallops. Peripheral pulses are normal and equal bilaterally without edema. No aortic or femoral bruits. Chest: symmetric with normal excursions and percussion.  Abdomen: Flat, soft, with bowel sounds. Nontender, no guarding, rebound, hernias, masses, or organomegaly.  Lymphatics: Non tender without lymphadenopathy.  Genitourinary: No hernias.Testes nl. DRE - prostate nl for age - smooth & firm w/o nodules. Musculoskeletal: Full ROM all peripheral extremities, joint stability, 5/5 strength, and normal gait. Skin: Warm and dry without rashes, lesions, cyanosis, clubbing or  ecchymosis.  Neuro: Cranial nerves intact, reflexes equal bilaterally. Normal muscle tone, no cerebellar symptoms. Sensation decreased from ankles to toes bilaterally by monofilament testing.  Pysch: Alert and oriented X 3 with normal affect, insight and judgment appropriate.   Assessment and Plan  1. Essential hypertension  - EKG 12-Lead - [place], RETROPERITNL ABD,  LTD - TSH  2. Mixed hyperlipidemia  - Lipid panel  3. Type 2 diabetes mellitus with diabetic chronic kidney disease  - Microalbumin / creatinine urine ratio - HM DIABETES FOOT EXAM - LOW EXTREMITY NEUR EXAM DOCUM - Hemoglobin A1c - Insulin, random  4. Vitamin D deficiency  - Vit D  25 hydroxy   5. Morbid obesity (BMI 45+)   6. Peripheral sensory neuropathy due to T2_NIDDM  - Vitamin B12  7. Gastroesophageal reflux disease   8. Screening for rectal cancer  - POC Hemoccult Bld/Stl   9. Prostate cancer screening  - PSA  10. Other fatigue  - Testosterone - Iron and TIBC - TSH  11. Medication management  - Uric acid - CBC with Differential/Platelet -  BASIC METABOLIC PANEL WITH GFR - Hepatic function panel - Magnesium  12. At low risk for fall   13. Depression screen   14. Type 2 diabetes mellitus with sensory neuropathy    Continue prudent diet as discussed, weight control, BP monitoring, regular exercise, and medications as discussed.  Discussed med effects and SE's. Routine screening labs and tests as requested with regular follow-up as recommended.  Over 40 minutes of exam, counseling &  chart review was performed

## 2015-05-16 DIAGNOSIS — G4733 Obstructive sleep apnea (adult) (pediatric): Secondary | ICD-10-CM | POA: Diagnosis not present

## 2015-05-26 ENCOUNTER — Encounter: Payer: Self-pay | Admitting: *Deleted

## 2015-06-15 DIAGNOSIS — G4733 Obstructive sleep apnea (adult) (pediatric): Secondary | ICD-10-CM | POA: Diagnosis not present

## 2015-06-18 DIAGNOSIS — G4733 Obstructive sleep apnea (adult) (pediatric): Secondary | ICD-10-CM | POA: Diagnosis not present

## 2015-07-16 DIAGNOSIS — G4733 Obstructive sleep apnea (adult) (pediatric): Secondary | ICD-10-CM | POA: Diagnosis not present

## 2015-07-27 ENCOUNTER — Other Ambulatory Visit: Payer: Self-pay | Admitting: Internal Medicine

## 2015-07-30 ENCOUNTER — Other Ambulatory Visit: Payer: Self-pay | Admitting: Physician Assistant

## 2015-07-30 MED ORDER — DAPAGLIFLOZIN PROPANEDIOL 10 MG PO TABS: 10.0000 mg | ORAL_TABLET | Freq: Every day | ORAL | Status: DC

## 2015-08-15 ENCOUNTER — Other Ambulatory Visit: Payer: Self-pay | Admitting: Internal Medicine

## 2015-08-15 DIAGNOSIS — G4733 Obstructive sleep apnea (adult) (pediatric): Secondary | ICD-10-CM | POA: Diagnosis not present

## 2015-08-16 ENCOUNTER — Ambulatory Visit (INDEPENDENT_AMBULATORY_CARE_PROVIDER_SITE_OTHER): Payer: Commercial Managed Care - HMO | Admitting: Physician Assistant

## 2015-08-16 ENCOUNTER — Encounter: Payer: Self-pay | Admitting: Physician Assistant

## 2015-08-16 VITALS — BP 130/90 | HR 94 | Temp 98.0°F | Resp 16 | Ht 69.0 in | Wt 302.0 lb

## 2015-08-16 DIAGNOSIS — Z0000 Encounter for general adult medical examination without abnormal findings: Secondary | ICD-10-CM | POA: Diagnosis not present

## 2015-08-16 DIAGNOSIS — Z79899 Other long term (current) drug therapy: Secondary | ICD-10-CM

## 2015-08-16 DIAGNOSIS — Z0001 Encounter for general adult medical examination with abnormal findings: Secondary | ICD-10-CM

## 2015-08-16 DIAGNOSIS — I723 Aneurysm of iliac artery: Secondary | ICD-10-CM

## 2015-08-16 DIAGNOSIS — E559 Vitamin D deficiency, unspecified: Secondary | ICD-10-CM

## 2015-08-16 DIAGNOSIS — K219 Gastro-esophageal reflux disease without esophagitis: Secondary | ICD-10-CM

## 2015-08-16 DIAGNOSIS — N529 Male erectile dysfunction, unspecified: Secondary | ICD-10-CM

## 2015-08-16 DIAGNOSIS — G894 Chronic pain syndrome: Secondary | ICD-10-CM

## 2015-08-16 DIAGNOSIS — E782 Mixed hyperlipidemia: Secondary | ICD-10-CM | POA: Diagnosis not present

## 2015-08-16 DIAGNOSIS — I10 Essential (primary) hypertension: Secondary | ICD-10-CM | POA: Diagnosis not present

## 2015-08-16 DIAGNOSIS — E1142 Type 2 diabetes mellitus with diabetic polyneuropathy: Secondary | ICD-10-CM

## 2015-08-16 DIAGNOSIS — R6889 Other general symptoms and signs: Secondary | ICD-10-CM | POA: Diagnosis not present

## 2015-08-16 DIAGNOSIS — N182 Chronic kidney disease, stage 2 (mild): Secondary | ICD-10-CM

## 2015-08-16 DIAGNOSIS — E1122 Type 2 diabetes mellitus with diabetic chronic kidney disease: Secondary | ICD-10-CM

## 2015-08-16 DIAGNOSIS — E1129 Type 2 diabetes mellitus with other diabetic kidney complication: Secondary | ICD-10-CM | POA: Diagnosis not present

## 2015-08-16 LAB — HEPATIC FUNCTION PANEL
ALT: 10 U/L (ref 9–46)
AST: 18 U/L (ref 10–35)
Albumin: 4.4 g/dL (ref 3.6–5.1)
Alkaline Phosphatase: 63 U/L (ref 40–115)
Bilirubin, Direct: 0.1 mg/dL (ref <=0.2)
Indirect Bilirubin: 0.3 mg/dL (ref 0.2–1.2)
Total Bilirubin: 0.4 mg/dL (ref 0.2–1.2)
Total Protein: 8.2 g/dL — ABNORMAL HIGH (ref 6.1–8.1)

## 2015-08-16 LAB — BASIC METABOLIC PANEL WITH GFR
BUN: 18 mg/dL (ref 7–25)
CO2: 23 mmol/L (ref 20–31)
Calcium: 9.7 mg/dL (ref 8.6–10.3)
Chloride: 103 mmol/L (ref 98–110)
Creat: 1 mg/dL (ref 0.70–1.33)
GFR, Est African American: >89 mL/min (ref >=60)
GFR, Est Non African American: 82 mL/min (ref >=60)
Glucose, Bld: 66 mg/dL (ref 65–99)
Potassium: 4.2 mmol/L (ref 3.5–5.3)
Sodium: 140 mmol/L (ref 135–146)

## 2015-08-16 LAB — CBC WITH DIFFERENTIAL/PLATELET
Basophils Absolute: 0.1 10*3/uL (ref 0.0–0.1)
Basophils Relative: 1 % (ref 0–1)
Eosinophils Absolute: 0.3 10*3/uL (ref 0.0–0.7)
Eosinophils Relative: 4 % (ref 0–5)
HCT: 45.6 % (ref 39.0–52.0)
Hemoglobin: 14.9 g/dL (ref 13.0–17.0)
Lymphocytes Relative: 48 % — ABNORMAL HIGH (ref 12–46)
Lymphs Abs: 3.1 10*3/uL (ref 0.7–4.0)
MCH: 26.8 pg (ref 26.0–34.0)
MCHC: 32.7 g/dL (ref 30.0–36.0)
MCV: 82 fL (ref 78.0–100.0)
MPV: 9 fL (ref 8.6–12.4)
Monocytes Absolute: 0.7 10*3/uL (ref 0.1–1.0)
Monocytes Relative: 11 % (ref 3–12)
Neutro Abs: 2.3 10*3/uL (ref 1.7–7.7)
Neutrophils Relative %: 36 % — ABNORMAL LOW (ref 43–77)
Platelets: 311 10*3/uL (ref 150–400)
RBC: 5.56 MIL/uL (ref 4.22–5.81)
RDW: 14 % (ref 11.5–15.5)
WBC: 6.4 10*3/uL (ref 4.0–10.5)

## 2015-08-16 LAB — HEMOGLOBIN A1C
Hgb A1c MFr Bld: 6.7 % — ABNORMAL HIGH (ref <5.7)
Mean Plasma Glucose: 146 mg/dL — ABNORMAL HIGH (ref <117)

## 2015-08-16 LAB — LIPID PANEL
Cholesterol: 174 mg/dL (ref 125–200)
HDL: 47 mg/dL (ref >=40)
LDL Cholesterol: 106 mg/dL (ref <130)
Total CHOL/HDL Ratio: 3.7 Ratio (ref <=5.0)
Triglycerides: 103 mg/dL (ref <150)
VLDL: 21 mg/dL (ref <30)

## 2015-08-16 LAB — MAGNESIUM: Magnesium: 1.9 mg/dL (ref 1.5–2.5)

## 2015-08-16 LAB — TSH: TSH: 2.435 u[IU]/mL (ref 0.350–4.500)

## 2015-08-16 NOTE — Patient Instructions (Signed)
Diabetes is a very complicated disease...lets simplify it.  An easy way to look at it to understand the complications is if you think of the extra sugar floating in your blood stream as glass shards floating through your blood stream.    Diabetes affects your small vessels first: 1) The glass shards (sugar) scraps down the tiny blood vessels in your eyes and lead to diabetic retinopathy, the leading cause of blindness in the [place]. Diabetes is the leading cause of newly diagnosed adult (20 to 59 years of age) blindness in the [place].  2) The glass shards scratches down the tiny vessels of your legs leading to nerve damage called neuropathy and can lead to amputations of your feet. More than 60% of all non-traumatic amputations of lower limbs occur in people with diabetes.  3) Over time the small vessels in your brain are shredded and closed off, individually this does not cause any problems but over a long period of time many of the small vessels being blocked can lead to Vascular Dementia.   4) Your kidney's are a filter system and have a "net" that keeps certain things in the body and lets bad things out. Sugar shreds this net and leads to kidney damage and eventually failure. Decreasing the sugar that is destroying the net and certain blood pressure medications can help stop or decrease progression of kidney disease. Diabetes was the primary cause of kidney failure in 44 percent of all new cases in 2011.  5) Diabetes also destroys the small vessels in your penis that lead to erectile dysfunction. Eventually the vessels are so damaged that you may not be responsive to cialis or viagra.   Diabetes and your large vessels: Your larger vessels consist of your coronary arteries in your heart and the carotid vessels to your brain. Diabetes or even increased sugars put you at 300% increased risk of heart attack and stroke and this is why.. The sugar scrapes down your large blood vessels and your body  sees this as an internal injury and tries to repair itself. Just like you get a scab on your skin, your platelets will stick to the blood vessel wall trying to heal it. This is why we have diabetics on low dose aspirin daily, this prevents the platelets from sticking and can prevent plaque formation. In addition, your body takes cholesterol and tries to shove it into the open wound. This is why we want your LDL, or bad cholesterol, below 70.   The combination of platelets and cholesterol over 5-10 years forms plaque that can break off and cause a heart attack or stroke.   PLEASE REMEMBER:  Diabetes is preventable! Up to 85 percent of complications and morbidities among individuals with type 2 diabetes can be prevented, delayed, or effectively treated and minimized with regular visits to a health professional, appropriate monitoring and medication, and a healthy diet and lifestyle.     Bad carbs also include fruit juice, alcohol, and sweet tea. These are empty calories that do not signal to your brain that you are full.   Please remember the good carbs are still carbs which convert into sugar. So please measure them out no more than 1/2-1 cup of rice, oatmeal, pasta, and beans  Veggies are however free foods! Pile them on.   Not all fruit is created equal. Please see the list below, the fruit at the bottom is higher in sugars than the fruit at the top. Please avoid all dried fruits.       We want weight loss that will last so you should lose 1-2 pounds a week.  THAT IS IT! Please pick THREE things a month to change. Once it is a habit check off the item. Then pick another three items off the list to become habits.  If you are already doing a habit on the list GREAT!  Cross that item off! o Don't drink your calories. Ie, alcohol, soda, fruit juice, and sweet tea.  o Drink more water. Drink a glass when you feel hungry or before each meal.  o Eat breakfast - Complex carb and protein (likeDannon  light and fit yogurt, oatmeal, fruit, eggs, turkey bacon). o Measure your cereal.  Eat no more than one cup a day. (ie Kashi) o Eat an apple a day. o Add a vegetable a day. o Try a new vegetable a month. o Use Pam! Stop using oil or butter to cook. o Don't finish your plate or use smaller plates. o Share your dessert. o Eat sugar free Jello for dessert or frozen grapes. o Don't eat 2-3 hours before bed. o Switch to whole wheat bread, pasta, and brown rice. o Make healthier choices when you eat out. No fries! o Pick baked chicken, NOT fried. o Don't forget to SLOW DOWN when you eat. It is not going anywhere.  o Take the stairs. o Park far away in the parking lot o Lift soup cans (or weights) for 10 minutes while watching TV. o Walk at work for 10 minutes during break. o Walk outside 1 time a week with your friend, kids, dog, or significant other. o Start a walking group at church. o Walk the mall as much as you can tolerate.  o Keep a food diary. o Weigh yourself daily. o Walk for 15 minutes 3 days per week. o Cook at home more often and eat out less.  If life happens and you go back to old habits, it is okay.  Just start over. You can do it!   If you experience chest pain, get short of breath, or tired during the exercise, please stop immediately and inform your doctor.   

## 2015-08-16 NOTE — Progress Notes (Signed)
Complete Physical  Assessment and Plan: 1. Essential hypertension - continue medications, DASH diet, exercise and monitor at home. Call if greater than 130/80.  - CBC with Differential/Platelet - BASIC METABOLIC PANEL WITH GFR - Hepatic function panel - TSH  2. Aneurysm artery, iliac common (HCC) Control blood pressure, cholesterol, glucose, increase exercise.   3. Type 2 diabetes mellitus with stage 2 chronic kidney disease, without long-term current use of insulin (HCC) Discussed general issues about diabetes pathophysiology and management., Educational material distributed., Suggested low cholesterol diet., Encouraged aerobic exercise., Discussed foot care., Reminded to get yearly retinal exam. - Hemoglobin A1c  4. Peripheral sensory neuropathy due to T2_NIDDM Control sugars, on lyrica, monitor feet  5. Erectile dysfunction, unspecified erectile dysfunction type levitra samples given  6. Morbid obesity, unspecified obesity type (HCC) Obesity with co morbidities- long discussion about weight loss, diet, and exercise  7. Mixed hyperlipidemia -continue medications, check lipids, decrease fatty foods, increase activity.  - Lipid panel  8. Vitamin D deficiency - VITAMIN D 25 Hydroxy (Vit-D Deficiency, Fractures)  9. Medication management - Magnesium  10. Chronic pain syndrome Continue pain management  11. Gastroesophageal reflux disease, esophagitis presence not specified Continue PPI/H2 blocker, diet discussed  12. Encounter for general adult medical examination with abnormal findings - CBC with Differential/Platelet - BASIC METABOLIC PANEL WITH GFR - Hepatic function panel - TSH - Lipid panel - Hemoglobin A1c - Magnesium - VITAMIN D 25 Hydroxy (Vit-D Deficiency, Fractures)  Discussed med's effects and SE's. Screening labs and tests as requested with regular follow-up as recommended. Over 40 minutes of exam, counseling, chart review and critical decision making was  performed  HPI Patient presents for a complete physical.   His blood pressure has been controlled at home, today their BP is BP: (!) 140/100 mmHg recheck 130/90.  He does workout. He denies chest pain, shortness of breath, dizziness.  He is on cholesterol medication and denies myalgias. His cholesterol is not at goal. The cholesterol last visit was:   Lab Results  Component Value Date   CHOL 197 05/07/2015   HDL 53 05/07/2015   LDLCALC 128 05/07/2015   TRIG 81 05/07/2015   CHOLHDL 3.7 05/07/2015   He has been working on diet and exercise for diabetes with CKD, he is on bASA, he is on ACE/ARB, he is on farxiga, metofrmin and glipizide and denies hypoglycemia , polydipsia, polyuria and visual disturbances. Last A1C in the office was:  Lab Results  Component Value Date   HGBA1C 7.0* 05/07/2015   Lab Results  Component Value Date   GFRAA 84 05/07/2015   Patient is on Vitamin D supplement.   Lab Results  Component Value Date   VD25OH 42 05/07/2015     Last PSA was: Lab Results  Component Value Date   PSA 0.67 05/07/2015   Follows with Dr. Renae Fickle for left hand pain for workers comp but has been released and now seeing preferred management next door. He was put on lyrica samples and unknown if it helps, was also given doxepin.  BMI is Body mass index is 44.58 kg/(m^2)., he is working on diet and exercise. Wt Readings from Last 3 Encounters:  08/16/15 302 lb (136.986 kg)  05/07/15 305 lb 6.4 oz (138.529 kg)  01/23/15 302 lb (136.986 kg)    Current Medications:  Current Outpatient Prescriptions on File Prior to Visit  Medication Sig Dispense Refill  . CARTIA XT 120 MG 24 hr capsule TAKE 1 CAPSULE DAILY FOR BLOOD PRESSURE  30 capsule 3  . Cholecalciferol (VITAMIN D-3) 5000 UNITS TABS Take 1 tablet by mouth daily.    . dapagliflozin propanediol (FARXIGA) 10 MG TABS tablet Take 10 mg by mouth daily. 30 tablet 4  . glipiZIDE (GLUCOTROL) 5 MG tablet Take 5 mg by mouth as needed.  Patient takes when blood sugar is over 160    . glucose blood (ACCU-CHEK SMARTVIEW) test strip Use as instructed 100 each 12  . losartan (COZAAR) 100 MG tablet TAKE 1 TABLET EVERY DAY FOR BLOOD PRESSURE 90 tablet 0  . Magnesium-Zinc (MAGNESIUM-CHELATED ZINC PO) Take 400 mg by mouth daily.     . metFORMIN (GLUCOPHAGE-XR) 500 MG 24 hr tablet Take 2 tablets 2 x day with meals for Diabetes 360 tablet 1  . Multiple Vitamins-Minerals (THERAGRAN-M PREMIER 50 PLUS PO) Take 1 tablet by mouth daily.    . Omega-3 Fatty Acids (FISH OIL) 500 MG CAPS Take by mouth daily.    . pravastatin (PRAVACHOL) 40 MG tablet Take 1 tablet (40 mg total) by mouth daily. 90 tablet 3   No current facility-administered medications on file prior to visit.   Health Maintenance:  Immunization History  Administered Date(s) Administered  . Pneumococcal-Unspecified 02/16/2013  . Tdap 02/10/2011    Tetanus: 2012 Pneumovax: 2014 Prevnar 13: N/A Flu vaccine: declines Zostavax: N/A DEXA: N/a Colonoscopy: 2013 CT head 11/2014 US aorta 02/2014  Eye Exam: [facility] June 2016 Dentist: None, needs one  Patient Care Team: Lucky Cowboy, MD as PCP - General (Internal Medicine) Mardella Layman, MD as Consulting Physician (Gastroenterology) Mateo Flow, MD as Consulting Physician (Ophthalmology) Salvatore Marvel, MD as Consulting Physician (Orthopedic Surgery) Josephine Igo, MD as Consulting Physician (Orthopedic Surgery) Hilda Lias, MD as Consulting Physician (Neurosurgery) Carrington Clamp, DPM as Consulting Physician (Podiatry)  Allergies: No Known Allergies Medical History:  Past Medical History  Diagnosis Date  . Vitamin D deficiency    Surgical History:  Past Surgical History  Procedure Laterality Date  . Carpal tunnel release  2008    bilateral  . Knee arthroscopy  2010    left  . Cervical discectomy  2003   Family History:  Family History  Problem Relation Age of Onset  . Colon cancer Neg  Hx   . Stomach cancer Neg Hx   . Diabetes Father   . Hypertension Father   . Diabetes Sister   . Kidney disease Sister   . Heart disease Mother   . Stroke Mother   . Diabetes Mother    Social History:   Social History  Substance Use Topics  . Smoking status: Former Smoker    Quit date: 02/25/1991  . Smokeless tobacco: Never Used  . Alcohol Use: No   Review of Systems:  Review of Systems  Constitutional: Negative.   HENT: Negative.   Eyes: Negative.   Respiratory: Negative.   Cardiovascular: Negative.   Gastrointestinal: Negative.   Genitourinary: Negative.   Musculoskeletal: Positive for myalgias, back pain and joint pain. Negative for falls and neck pain.  Skin: Negative.   Neurological: Negative.   Endo/Heme/Allergies: Negative.   Psychiatric/Behavioral: Negative.     Physical Exam: Estimated body mass index is 44.58 kg/(m^2) as calculated from the following:   Height as of this encounter:  (1.753 m).   Weight as of this encounter: 302 lb (136.986 kg). BP 140/100 mmHg  Pulse 94  Temp(Src) 98 F (36.7 C) (Temporal)  Resp 16  Ht  (1.753 m)  Wt  302 lb (136.986 kg)  BMI 44.58 kg/m2  SpO2 94% General Appearance: Well nourished, in no apparent distress.  Eyes: PERRLA, EOMs, conjunctiva no swelling or erythema.  Sinuses: No Frontal/maxillary tenderness  ENT/Mouth: Ext aud canals clear, normal light reflex with TMs without erythema, bulging. No erythema, swelling, or exudate on post pharynx. Tonsils not swollen or erythematous. Hearing normal.  Neck: Supple, thyroid normal. No bruits  Respiratory: Respiratory effort normal, BS equal bilaterally without rales, rhonchi, wheezing or stridor.  Cardio: RRR without murmurs, rubs or gallops. Brisk peripheral pulses without edema.  Chest: symmetric, with normal excursions and percussion.  Abdomen: Soft, nontender, no guarding, rebound, hernias, masses, or organomegaly.  Lymphatics: Non tender without  lymphadenopathy.  Genitourinary: defer Musculoskeletal: Full ROM all peripheral extremities,5/5 strength, and normal gait. Skin: Warm, dry without rashes, lesions, ecchymosis. Neuro: Cranial nerves intact, reflexes equal bilaterally. Normal muscle tone, no cerebellar symptoms.  Psych: Awake and oriented X 3, normal affect, Insight and Judgment appropriate.   EKG: defer AORTA SCAN: defer  Quentin MullingAmanda Duve 11:03 AM [facility]

## 2015-08-17 LAB — VITAMIN D 25 HYDROXY (VIT D DEFICIENCY, FRACTURES): Vit D, 25-Hydroxy: 68 ng/mL (ref 30–100)

## 2015-09-15 DIAGNOSIS — G4733 Obstructive sleep apnea (adult) (pediatric): Secondary | ICD-10-CM | POA: Diagnosis not present

## 2015-09-19 DIAGNOSIS — G4733 Obstructive sleep apnea (adult) (pediatric): Secondary | ICD-10-CM | POA: Diagnosis not present

## 2015-10-16 DIAGNOSIS — G4733 Obstructive sleep apnea (adult) (pediatric): Secondary | ICD-10-CM | POA: Diagnosis not present

## 2015-10-24 ENCOUNTER — Other Ambulatory Visit: Payer: Self-pay | Admitting: Internal Medicine

## 2015-11-13 DIAGNOSIS — G4733 Obstructive sleep apnea (adult) (pediatric): Secondary | ICD-10-CM | POA: Diagnosis not present

## 2015-11-15 ENCOUNTER — Other Ambulatory Visit: Payer: Self-pay | Admitting: Physician Assistant

## 2015-11-16 ENCOUNTER — Encounter: Payer: Self-pay | Admitting: Internal Medicine

## 2015-11-16 ENCOUNTER — Ambulatory Visit (INDEPENDENT_AMBULATORY_CARE_PROVIDER_SITE_OTHER): Payer: Commercial Managed Care - HMO | Admitting: Internal Medicine

## 2015-11-16 VITALS — BP 128/86 | HR 80 | Temp 97.5°F | Resp 18 | Ht 69.0 in | Wt 310.0 lb

## 2015-11-16 DIAGNOSIS — E559 Vitamin D deficiency, unspecified: Secondary | ICD-10-CM

## 2015-11-16 DIAGNOSIS — E782 Mixed hyperlipidemia: Secondary | ICD-10-CM

## 2015-11-16 DIAGNOSIS — N182 Chronic kidney disease, stage 2 (mild): Secondary | ICD-10-CM

## 2015-11-16 DIAGNOSIS — Z79899 Other long term (current) drug therapy: Secondary | ICD-10-CM

## 2015-11-16 DIAGNOSIS — E1122 Type 2 diabetes mellitus with diabetic chronic kidney disease: Secondary | ICD-10-CM

## 2015-11-16 DIAGNOSIS — E1129 Type 2 diabetes mellitus with other diabetic kidney complication: Secondary | ICD-10-CM | POA: Diagnosis not present

## 2015-11-16 DIAGNOSIS — I10 Essential (primary) hypertension: Secondary | ICD-10-CM

## 2015-11-16 LAB — HEPATIC FUNCTION PANEL
ALT: 11 U/L (ref 9–46)
AST: 22 U/L (ref 10–35)
Albumin: 4.2 g/dL (ref 3.6–5.1)
Alkaline Phosphatase: 61 U/L (ref 40–115)
Bilirubin, Direct: 0.1 mg/dL (ref <=0.2)
Indirect Bilirubin: 0.3 mg/dL (ref 0.2–1.2)
Total Bilirubin: 0.4 mg/dL (ref 0.2–1.2)
Total Protein: 7.6 g/dL (ref 6.1–8.1)

## 2015-11-16 LAB — BASIC METABOLIC PANEL WITH GFR
BUN: 19 mg/dL (ref 7–25)
CO2: 27 mmol/L (ref 20–31)
Calcium: 9.2 mg/dL (ref 8.6–10.3)
Chloride: 104 mmol/L (ref 98–110)
Creat: 1.07 mg/dL (ref 0.70–1.33)
GFR, Est African American: 87 mL/min (ref >=60)
GFR, Est Non African American: 76 mL/min (ref >=60)
Glucose, Bld: 109 mg/dL — ABNORMAL HIGH (ref 65–99)
Potassium: 4.4 mmol/L (ref 3.5–5.3)
Sodium: 141 mmol/L (ref 135–146)

## 2015-11-16 LAB — CBC WITH DIFFERENTIAL/PLATELET
Basophils Absolute: 0 10*3/uL (ref 0.0–0.1)
Basophils Relative: 0 % (ref 0–1)
Eosinophils Absolute: 0.2 10*3/uL (ref 0.0–0.7)
Eosinophils Relative: 4 % (ref 0–5)
HCT: 43.6 % (ref 39.0–52.0)
Hemoglobin: 14.1 g/dL (ref 13.0–17.0)
Lymphocytes Relative: 39 % (ref 12–46)
Lymphs Abs: 1.8 10*3/uL (ref 0.7–4.0)
MCH: 26.4 pg (ref 26.0–34.0)
MCHC: 32.3 g/dL (ref 30.0–36.0)
MCV: 81.5 fL (ref 78.0–100.0)
MPV: 9.1 fL (ref 8.6–12.4)
Monocytes Absolute: 0.5 10*3/uL (ref 0.1–1.0)
Monocytes Relative: 10 % (ref 3–12)
Neutro Abs: 2.2 10*3/uL (ref 1.7–7.7)
Neutrophils Relative %: 47 % (ref 43–77)
Platelets: 282 10*3/uL (ref 150–400)
RBC: 5.35 MIL/uL (ref 4.22–5.81)
RDW: 14.2 % (ref 11.5–15.5)
WBC: 4.7 10*3/uL (ref 4.0–10.5)

## 2015-11-16 LAB — HEMOGLOBIN A1C
Hgb A1c MFr Bld: 7.3 % — ABNORMAL HIGH (ref <5.7)
Mean Plasma Glucose: 163 mg/dL

## 2015-11-16 LAB — LIPID PANEL
Cholesterol: 180 mg/dL (ref 125–200)
HDL: 45 mg/dL (ref >=40)
LDL Cholesterol: 126 mg/dL (ref <130)
Total CHOL/HDL Ratio: 4 Ratio (ref <=5.0)
Triglycerides: 47 mg/dL (ref <150)
VLDL: 9 mg/dL (ref <30)

## 2015-11-16 LAB — MAGNESIUM: Magnesium: 2.1 mg/dL (ref 1.5–2.5)

## 2015-11-16 LAB — TSH: TSH: 1.67 mIU/L (ref 0.40–4.50)

## 2015-11-16 NOTE — Patient Instructions (Signed)

## 2015-11-16 NOTE — Progress Notes (Signed)
Patient ID: [name] [name], male   DOB: 10/14/1955, 60 y.o.   MRN: [id number]   This very nice 59 y.o.male presents for 3 month follow up with Hypertension, Hyperlipidemia, Pre-Diabetes and Vitamin D Deficiency. Patient has been on SS Disability for Bilat Carpal tunnel Syndrome failing surgery and ifs followed by Dr Eduard ClosBethea and treated with Lyrica.   Patient is treated for HTN since 2007 & BP has been controlled at home. Today's BP: 128/86 mmHg. Patient has had no complaints of any cardiac type chest pain, palpitations, dyspnea/orthopnea/PND, dizziness, claudication, or dependent edema.   Hyperlipidemia is controlled with diet & meds. Patient denies myalgias or other med SE's. Last Lipids were 08/16/2015: Cholesterol 174; HDL 47; LDL 106; Triglycerides 103   Also, the patient has history of Morbid Obesity (BMI 45+) and consequent T2_NIDDM circa 2013  and has had no symptoms of reactive hypoglycemia, diabetic polys or visual blurring, but does c/o  Paresthesias of the soles of his feet.  Last A1c was not at goal due to uncontrolled overeating with A1c 6.7% on 08/16/2015.   Further, the patient also has history of Vitamin D Deficiency and supplements vitamin D without any suspected side-effects. Last vitamin D was 08/16/2015: Vit D, 25-Hydroxy 68 on      Medication Sig  . CARTIA XT 120 MG  TAKE 1 CAPSULE DAILY FOR BLOOD PRESSURE  . VITAMIN D 5000 UNITS Take 1 tablet by mouth daily.  . dapagliflozin / FARXIGA 10 MG Take 10 mg by mouth daily.  Marland Kitchen. glipiZIDE  5 MG tablet Take 5 mg by mouth as needed. Patient takes when blood sugar is over 160  . losartan 100 MG tablet TAKE 1 TABLET EVERY DAY FOR BLOOD PRESSURE  . Magnesium-Zinc Take 400 mg by mouth daily.   . metFORMIN-XR 500 MG  Take 2 tablets 2 x day with meals for Diabetes  . Multiple Vitamins-Minerals  Take 1 tablet by mouth daily.  . Omega-3 FISH OIL 500 MG Take by mouth daily.  . pravastatin  40 MG tablet Take 1 tablet (40 mg total) by mouth  daily.   No Known Allergies  PMHx:   Past Medical History  Diagnosis Date  . Vitamin D deficiency    Immunization History  Administered Date(s) Administered  . Pneumococcal-Unspecified 02/16/2013  . Tdap 02/10/2011   Past Surgical History  Procedure Laterality Date  . Carpal tunnel release  2008    bilateral  . Knee arthroscopy  2010    left  . Cervical discectomy  2003   FHx:    Reviewed / unchanged  SHx:    Reviewed / unchanged  Systems Review:  Constitutional: Denies fever, chills, wt changes, headaches, insomnia, fatigue, night sweats, change in appetite. Eyes: Denies redness, blurred vision, diplopia, discharge, itchy, watery eyes.  ENT: Denies discharge, congestion, post nasal drip, epistaxis, sore throat, earache, hearing loss, dental pain, tinnitus, vertigo, sinus pain, snoring.  CV: Denies chest pain, palpitations, irregular heartbeat, syncope, dyspnea, diaphoresis, orthopnea, PND, claudication or edema. Respiratory: denies cough, dyspnea, DOE, pleurisy, hoarseness, laryngitis, wheezing.  Gastrointestinal: Denies dysphagia, odynophagia, heartburn, reflux, water brash, abdominal pain or cramps, nausea, vomiting, bloating, diarrhea, constipation, hematemesis, melena, hematochezia  or hemorrhoids. Genitourinary: Denies dysuria, frequency, urgency, nocturia, hesitancy, discharge, hematuria or flank pain. Musculoskeletal: Denies arthralgias, myalgias, stiffness, jt. swelling, pain, limping or strain/sprain.  Skin: Denies pruritus, rash, hives, warts, acne, eczema or change in skin lesion(s). Neuro: No weakness, tremor, incoordination, spasms, paresthesia or pain. Psychiatric: Denies confusion, memory loss  or sensory loss. Endo: Denies change in weight, skin or hair change.  Heme/Lymph: No excessive bleeding, bruising or enlarged lymph nodes.  Physical Exam  BP 128/86 mmHg  Pulse 80  Temp(Src) 97.5 F (36.4 C)  Resp 18  Ht  (1.753 m)  Wt 310 lb (140.615 kg)   BMI 45.76 kg/m2  Appears well nourished and in no distress. Eyes: PERRLA, EOMs, conjunctiva no swelling or erythema. Sinuses: No frontal/maxillary tenderness ENT/Mouth: EAC's clear, TM's nl w/o erythema, bulging. Nares clear w/o erythema, swelling, exudates. Oropharynx clear without erythema or exudates. Oral hygiene is good. Tongue normal, non obstructing. Hearing intact.  Neck: Supple. Thyroid nl. Car 2+/2+ without bruits, nodes or JVD. Chest: Respirations nl with BS clear & equal w/o rales, rhonchi, wheezing or stridor.  Cor: Heart sounds normal w/ regular rate and rhythm without sig. murmurs, gallops, clicks, or rubs. Peripheral pulses normal and equal  without edema.  Abdomen: Soft & bowel sounds normal. Non-tender w/o guarding, rebound, hernias, masses, or organomegaly.  Lymphatics: Unremarkable.  Musculoskeletal: Full ROM all peripheral extremities, joint stability, 5/5 strength, and normal gait.  Skin: Warm, dry without exposed rashes, lesions or ecchymosis apparent.  Neuro: Cranial nerves intact, reflexes equal bilaterally. Sensory-motor testing grossly intact to touch, Vibratory & Monofilament to the toes bilaterally. Tendon reflexes grossly intact.  Pysch: Alert & oriented x 3.  Insight and judgement nl & appropriate. No ideations.  Assessment and Plan:  1. Essential hypertension  - TSH  2. Mixed hyperlipidemia  - Lipid panel - TSH  3. Type 2 diabetes mellitus with stage 2 chronic kidney disease, without long-term current use of insulin (HCC)  - Hemoglobin A1c - Insulin, random - Given Sx's Invokana 300 mg to use in place of Farxiga   4. Vitamin D deficiency  - VITAMIN D 25 Hydroxy   5. Morbid obesity due to excess calories (HCC)  - Rx Phentermine 37.5 Mg tab #30 x 5 rf - Extensive counseling in diabetic & wt loss  diet   6. Medication management  - CBC with Differential/Platelet - BASIC METABOLIC PANEL WITH GFR - Hepatic function panel -  Magnesium   Recommended regular exercise, BP monitoring, weight control, and discussed med and SE's. Recommended labs to assess and monitor clinical status. Further disposition pending results of labs. Over 30 minutes of exam, counseling, chart review was performed

## 2015-11-17 LAB — VITAMIN D 25 HYDROXY (VIT D DEFICIENCY, FRACTURES): Vit D, 25-Hydroxy: 68 ng/mL (ref 30–100)

## 2015-11-19 LAB — INSULIN, RANDOM: Insulin: 18 u[IU]/mL (ref 2.0–19.6)

## 2015-12-14 DIAGNOSIS — G4733 Obstructive sleep apnea (adult) (pediatric): Secondary | ICD-10-CM | POA: Diagnosis not present

## 2015-12-17 DIAGNOSIS — G4733 Obstructive sleep apnea (adult) (pediatric): Secondary | ICD-10-CM | POA: Diagnosis not present

## 2015-12-25 ENCOUNTER — Other Ambulatory Visit: Payer: Self-pay | Admitting: Internal Medicine

## 2015-12-28 ENCOUNTER — Other Ambulatory Visit: Payer: Self-pay | Admitting: Physician Assistant

## 2015-12-28 ENCOUNTER — Other Ambulatory Visit: Payer: Self-pay | Admitting: Internal Medicine

## 2016-01-13 DIAGNOSIS — G4733 Obstructive sleep apnea (adult) (pediatric): Secondary | ICD-10-CM | POA: Diagnosis not present

## 2016-02-13 DIAGNOSIS — G4733 Obstructive sleep apnea (adult) (pediatric): Secondary | ICD-10-CM | POA: Diagnosis not present

## 2016-02-14 ENCOUNTER — Other Ambulatory Visit: Payer: Self-pay | Admitting: *Deleted

## 2016-02-14 MED ORDER — DAPAGLIFLOZIN PROPANEDIOL 10 MG PO TABS: 10.0000 mg | ORAL_TABLET | Freq: Every day | ORAL | Status: DC

## 2016-02-20 ENCOUNTER — Other Ambulatory Visit: Payer: Self-pay | Admitting: *Deleted

## 2016-02-20 MED ORDER — DAPAGLIFLOZIN PROPANEDIOL 10 MG PO TABS: 10.0000 mg | ORAL_TABLET | Freq: Every day | ORAL | Status: DC

## 2016-02-29 ENCOUNTER — Ambulatory Visit (INDEPENDENT_AMBULATORY_CARE_PROVIDER_SITE_OTHER): Payer: Commercial Managed Care - HMO | Admitting: Internal Medicine

## 2016-02-29 ENCOUNTER — Encounter: Payer: Self-pay | Admitting: Internal Medicine

## 2016-02-29 VITALS — BP 136/88 | HR 92 | Temp 98.2°F | Resp 18 | Ht 69.0 in | Wt 300.0 lb

## 2016-02-29 DIAGNOSIS — K219 Gastro-esophageal reflux disease without esophagitis: Secondary | ICD-10-CM | POA: Diagnosis not present

## 2016-02-29 DIAGNOSIS — E782 Mixed hyperlipidemia: Secondary | ICD-10-CM

## 2016-02-29 DIAGNOSIS — Z79899 Other long term (current) drug therapy: Secondary | ICD-10-CM

## 2016-02-29 DIAGNOSIS — E559 Vitamin D deficiency, unspecified: Secondary | ICD-10-CM

## 2016-02-29 DIAGNOSIS — E1142 Type 2 diabetes mellitus with diabetic polyneuropathy: Secondary | ICD-10-CM | POA: Diagnosis not present

## 2016-02-29 DIAGNOSIS — I723 Aneurysm of iliac artery: Secondary | ICD-10-CM | POA: Diagnosis not present

## 2016-02-29 DIAGNOSIS — G894 Chronic pain syndrome: Secondary | ICD-10-CM

## 2016-02-29 DIAGNOSIS — I10 Essential (primary) hypertension: Secondary | ICD-10-CM | POA: Diagnosis not present

## 2016-02-29 DIAGNOSIS — Z0000 Encounter for general adult medical examination without abnormal findings: Secondary | ICD-10-CM

## 2016-02-29 DIAGNOSIS — E1122 Type 2 diabetes mellitus with diabetic chronic kidney disease: Secondary | ICD-10-CM | POA: Diagnosis not present

## 2016-02-29 DIAGNOSIS — N182 Chronic kidney disease, stage 2 (mild): Secondary | ICD-10-CM | POA: Diagnosis not present

## 2016-02-29 DIAGNOSIS — R6889 Other general symptoms and signs: Secondary | ICD-10-CM

## 2016-02-29 DIAGNOSIS — N529 Male erectile dysfunction, unspecified: Secondary | ICD-10-CM | POA: Diagnosis not present

## 2016-02-29 DIAGNOSIS — Z0001 Encounter for general adult medical examination with abnormal findings: Secondary | ICD-10-CM

## 2016-02-29 LAB — HEPATIC FUNCTION PANEL
ALT: 10 U/L (ref 9–46)
AST: 23 U/L (ref 10–35)
Albumin: 4.2 g/dL (ref 3.6–5.1)
Alkaline Phosphatase: 69 U/L (ref 40–115)
Bilirubin, Direct: 0.1 mg/dL (ref <=0.2)
Indirect Bilirubin: 0.4 mg/dL (ref 0.2–1.2)
Total Bilirubin: 0.5 mg/dL (ref 0.2–1.2)
Total Protein: 8.3 g/dL — ABNORMAL HIGH (ref 6.1–8.1)

## 2016-02-29 LAB — CBC WITH DIFFERENTIAL/PLATELET
Basophils Absolute: 52 cells/uL (ref 0–200)
Basophils Relative: 1 %
Eosinophils Absolute: 260 cells/uL (ref 15–500)
Eosinophils Relative: 5 %
HCT: 44.7 % (ref 38.5–50.0)
Hemoglobin: 14.6 g/dL (ref 13.2–17.1)
Lymphocytes Relative: 39 %
Lymphs Abs: 2028 cells/uL (ref 850–3900)
MCH: 26.7 pg — ABNORMAL LOW (ref 27.0–33.0)
MCHC: 32.7 g/dL (ref 32.0–36.0)
MCV: 81.7 fL (ref 80.0–100.0)
MPV: 9 fL (ref 7.5–12.5)
Monocytes Absolute: 780 cells/uL (ref 200–950)
Monocytes Relative: 15 %
Neutro Abs: 2080 cells/uL (ref 1500–7800)
Neutrophils Relative %: 40 %
Platelets: 297 10*3/uL (ref 140–400)
RBC: 5.47 MIL/uL (ref 4.20–5.80)
RDW: 13.8 % (ref 11.0–15.0)
WBC: 5.2 10*3/uL (ref 3.8–10.8)

## 2016-02-29 LAB — LIPID PANEL
Cholesterol: 171 mg/dL (ref 125–200)
HDL: 51 mg/dL (ref >=40)
LDL Cholesterol: 104 mg/dL (ref <130)
Total CHOL/HDL Ratio: 3.4 Ratio (ref <=5.0)
Triglycerides: 79 mg/dL (ref <150)
VLDL: 16 mg/dL (ref <30)

## 2016-02-29 LAB — BASIC METABOLIC PANEL WITH GFR
BUN: 19 mg/dL (ref 7–25)
CO2: 25 mmol/L (ref 20–31)
Calcium: 9.8 mg/dL (ref 8.6–10.3)
Chloride: 102 mmol/L (ref 98–110)
Creat: 0.96 mg/dL (ref 0.70–1.25)
GFR, Est African American: >89 mL/min (ref >=60)
GFR, Est Non African American: 86 mL/min (ref >=60)
Glucose, Bld: 96 mg/dL (ref 65–99)
Potassium: 4.3 mmol/L (ref 3.5–5.3)
Sodium: 140 mmol/L (ref 135–146)

## 2016-02-29 LAB — HEMOGLOBIN A1C
Hgb A1c MFr Bld: 6.7 % — ABNORMAL HIGH (ref <5.7)
Mean Plasma Glucose: 146 mg/dL

## 2016-02-29 LAB — TSH: TSH: 1.98 mIU/L (ref 0.40–4.50)

## 2016-02-29 MED ORDER — DAPAGLIFLOZIN PROPANEDIOL 10 MG PO TABS: 10.0000 mg | ORAL_TABLET | Freq: Every day | ORAL | Status: DC

## 2016-02-29 NOTE — Progress Notes (Signed)
MEDICARE ANNUAL WELLNESS VISIT AND FOLLOW UP Assessment:    1. Essential hypertension -well controlled today -cont dash diet -monitor at home - TSH  2. Aneurysm artery, iliac common (HCC) -followed by vascular  3. Gastroesophageal reflux disease, esophagitis presence not specified -cont meds -avoid trigger foods  4. Type 2 diabetes mellitus with stage 2 chronic kidney disease, without long-term current use of insulin (HCC) -encouraged to take CBGs at home -cont meds -dose adjust if not at goal -non-compliant with diet - Hemoglobin A1c  5. Peripheral sensory neuropathy due to T2_NIDDM -try to achieve tighter blood sugar -lyrica  6. Erectile dysfunction, unspecified erectile dysfunction type -viagra samples given  7. Mixed hyperlipidemia -cont statin - Lipid panel  8. Morbid obesity due to excess calories (HCC) -diet and exercise recommended  9. Vitamin D deficiency -cont supplement  10. Medication management  - CBC with Differential/Platelet - Hepatic function panel - BASIC METABOLIC PANEL WITH GFR  11. Chronic pain syndrome -followed by pain management -cont lyrica     Over 30 minutes of exam, counseling, chart review, and critical decision making was performed  Future Appointments Date Time Provider Department Center  06/02/2016 10:00 AM Lucky Cowboy, MD [facility] None     Plan:   During the course of the visit the patient was educated and counseled about appropriate screening and preventive services including:    Pneumococcal vaccine   Influenza vaccine  Prevnar 13  Td vaccine  Screening electrocardiogram  Colorectal cancer screening  Diabetes screening  Glaucoma screening  Nutrition counseling    Subjective:  [name] [name] is a 60 y.o. male who presents for Medicare Annual Wellness Visit and 3 month follow up for HTN, hyperlipidemia, diabetes, and vitamin D Def.   His blood pressure has been controlled at home, today  their BP is BP: 136/88 mmHg He does workout. He denies chest pain, shortness of breath, dizziness.  He is on cholesterol medication and denies myalgias. His cholesterol is at goal. The cholesterol last visit was:   Lab Results  Component Value Date   CHOL 180 11/16/2015   HDL 45 11/16/2015   LDLCALC 126 11/16/2015   TRIG 47 11/16/2015   CHOLHDL 4.0 11/16/2015   He is a diabetic.  He reports that he eats well.  He has been out of his faxiga for the past week.  He has not had headaches.  He reports that he is not checking his sugar.  No parasthesias.  He reports that the lyrica is helping a lot.     Lab Results  Component Value Date   HGBA1C 7.3* 11/16/2015   Last GFR Lab Results  Component Value Date   GFRNONAA 76 11/16/2015     Lab Results  Component Value Date   GFRAA 87 11/16/2015   Patient is on Vitamin D supplement.   Lab Results  Component Value Date   VD25OH 68 11/16/2015      Medication Review: Current Outpatient Prescriptions on File Prior to Visit  Medication Sig Dispense Refill  . CARTIA XT 120 MG 24 hr capsule TAKE 1 CAPSULE EVERY DAY FOR BLOOD PRESSURE 90 capsule 3  . Cholecalciferol (VITAMIN D-3) 5000 UNITS TABS Take 1 tablet by mouth daily.    Marland Kitchen glipiZIDE (GLUCOTROL) 5 MG tablet Take 5 mg by mouth as needed. Patient takes when blood sugar is over 160    . glucose blood (ACCU-CHEK SMARTVIEW) test strip Use as instructed 100 each 12  . losartan (COZAAR) 100 MG tablet  TAKE 1 TABLET EVERY DAY FOR BLOOD PRESSURE 90 tablet 0  . Magnesium-Zinc (MAGNESIUM-CHELATED ZINC PO) Take 400 mg by mouth daily.     . metFORMIN (GLUCOPHAGE-XR) 500 MG 24 hr tablet TAKE 2 TABLETS TWICE DAILY  FOR  DIABETES 360 tablet 1  . Multiple Vitamins-Minerals (THERAGRAN-M PREMIER 50 PLUS PO) Take 1 tablet by mouth daily.    . Omega-3 Fatty Acids (FISH OIL) 500 MG CAPS Take by mouth daily.    . pravastatin (PRAVACHOL) 40 MG tablet TAKE 1 TABLET EVERY DAY (REPLACES SIMVASTATIN) 90 tablet 3    No current facility-administered medications on file prior to visit.    Allergies: No Known Allergies  Current Problems (verified) has HTN (hypertension); Mixed hyperlipidemia; GERD (gastroesophageal reflux disease); Morbid obesity (BMI 45+); Vitamin D deficiency; T2_NIDDM w/ Stage 2 CKD (GFR 77  ml/min); Medication management; Aneurysm artery, iliac common (HCC); Chronic pain syndrome; Peripheral sensory neuropathy due to T2_NIDDM; and Erectile dysfunction on his problem list.  Screening Tests Immunization History  Administered Date(s) Administered  . Pneumococcal-Unspecified 02/16/2013  . Tdap 02/10/2011    Preventative care: Last colonoscopy: 2013  Prior vaccinations: TD or Tdap: 2012  Influenza: Declined  Pneumococcal: 2014 Prevnar13: Declined Shingles/Zostavax: Declined  Names of Other Physician/Practitioners you currently use: 1. [facility] here for primary care 2. Dr. Elmer PickerHecker, eye doctor, last visit 2016 3. Does not have a denstist wants to get set up  Patient Care Team: Lucky CowboyWilliam McKeown, MD as PCP - General (Internal Medicine) Mardella Laymanavid R Patterson, MD as Consulting Physician (Gastroenterology) Mateo FlowKathryn Hecker, MD as Consulting Physician (Ophthalmology) Salvatore Marvelobert Wainer, MD as Consulting Physician (Orthopedic Surgery) Josephine Igoobert Sypher, MD as Consulting Physician (Orthopedic Surgery) Hilda LiasErnesto Botero, MD as Consulting Physician (Neurosurgery) Carrington Clampichard C Tuchman, DPM as Consulting Physician (Podiatry)  Surgical: He  has past surgical history that includes Carpal tunnel release (2008); Knee arthroscopy (2010); and Cervical discectomy (2003). Family His family history includes Diabetes in his father, mother, and sister; Heart disease in his mother; Hypertension in his father; Kidney disease in his sister; Stroke in his mother. There is no history of Colon cancer or Stomach cancer. Social history  He reports that he quit smoking about 25 years  ago. He has never used smokeless tobacco. He reports that he does not drink alcohol or use illicit drugs.  MEDICARE WELLNESS OBJECTIVES: Physical activity: Current Exercise Habits: The patient does not participate in regular exercise at present Cardiac risk factors: Cardiac Risk Factors include: advanced age (>3755men, 34>65 women);diabetes mellitus;dyslipidemia;male gender;hypertension;family history of premature cardiovascular disease;obesity (BMI >30kg/m2);sedentary lifestyle Depression/mood screen:   [facility] 02/29/2016  Decreased Interest 0  Down, Depressed, Hopeless 0  PHQ - 2 Score 0    ADLs:  In your present state of health, do you have any difficulty performing the following activities: 02/29/2016 11/16/2015  Hearing? N N  Vision? N N  Difficulty concentrating or making decisions? N N  Walking or climbing stairs? N N  Dressing or bathing? N N  Doing errands, shopping? N N  Preparing Food and eating ? N -  Using the Toilet? N -  In the past six months, have you accidently leaked urine? N -  Do you have problems with loss of bowel control? N -  Managing your Medications? N -  Managing your Finances? N -  Housekeeping or managing your Housekeeping? N -     Cognitive Testing  Alert? Yes  Normal Appearance?Yes  Oriented to person? Yes  Place?  Yes   Time? Yes  Recall of three objects?  Yes  Can perform simple calculations? Yes  Displays appropriate judgment?Yes  Can read the correct time from a watch face?Yes  EOL planning: Does patient have an advance directive?: No Would patient like information on creating an advanced directive?: Yes - Educational materials given   Objective:   Today's Vitals   02/29/16 0900  BP: 136/88  Pulse: 92  Temp: 98.2 F (36.8 C)  TempSrc: Temporal  Resp: 18  Height: 5\' 9"  (1.753 m)  Weight: 300 lb (136.079 kg)   Body mass index is 44.28 kg/(m^2).  General appearance: alert, no distress, WD/WN, male HEENT:  normocephalic, sclerae anicteric, TMs pearly, nares patent, no discharge or erythema, pharynx normal Oral cavity: MMM, no lesions Neck: supple, no lymphadenopathy, no thyromegaly, no masses Heart: RRR, normal S1, S2, no murmurs Lungs: CTA bilaterally, no wheezes, rhonchi, or rales Abdomen: +bs, soft, non tender, non distended, no masses, no hepatomegaly, no splenomegaly Musculoskeletal: nontender, no swelling, no obvious deformity Extremities: no edema, no cyanosis, no clubbing Pulses: 2+ symmetric, upper and lower extremities, normal cap refill Neurological: alert, oriented x 3, CN2-12 intact, strength normal upper extremities and lower extremities, sensation normal throughout, DTRs 2+ throughout, no cerebellar signs, gait normal Psychiatric: normal affect, behavior normal, pleasant   Medicare Attestation I have personally reviewed: The patient's medical and social history Their use of alcohol, tobacco or illicit drugs Their current medications and supplements The patient's functional ability including ADLs,fall risks, home safety risks, cognitive, and hearing and visual impairment Diet and physical activities Evidence for depression or mood disorders  The patient's weight, height, BMI, and visual acuity have been recorded in the chart.  I have made referrals, counseling, and provided education to the patient based on review of the above and I have provided the patient with a written personalized care plan for preventive services.     Terri Piedra, PA-C   02/29/2016

## 2016-02-29 NOTE — Patient Instructions (Signed)
[name] [name] Address: [address] [place], [place] [address] Phone: [phone]  [name] CoombesSandra L. [facility] Address: [address], [place], [place] [address]  Phone: [phone]) [phone]

## 2016-03-14 DIAGNOSIS — G4733 Obstructive sleep apnea (adult) (pediatric): Secondary | ICD-10-CM | POA: Diagnosis not present

## 2016-04-02 DIAGNOSIS — E119 Type 2 diabetes mellitus without complications: Secondary | ICD-10-CM | POA: Diagnosis not present

## 2016-04-28 ENCOUNTER — Other Ambulatory Visit: Payer: Self-pay | Admitting: Internal Medicine

## 2016-05-09 ENCOUNTER — Other Ambulatory Visit: Payer: Self-pay | Admitting: Internal Medicine

## 2016-05-09 MED ORDER — PREGABALIN 75 MG PO CAPS: 75.0000 mg | ORAL_CAPSULE | Freq: Three times a day (TID) | ORAL | 2 refills | Status: DC

## 2016-06-02 ENCOUNTER — Encounter: Payer: Self-pay | Admitting: Internal Medicine

## 2016-06-02 ENCOUNTER — Ambulatory Visit (INDEPENDENT_AMBULATORY_CARE_PROVIDER_SITE_OTHER): Payer: Commercial Managed Care - HMO | Admitting: Internal Medicine

## 2016-06-02 VITALS — BP 144/82 | HR 92 | Temp 97.5°F | Resp 16 | Ht 68.75 in | Wt 294.0 lb

## 2016-06-02 DIAGNOSIS — K219 Gastro-esophageal reflux disease without esophagitis: Secondary | ICD-10-CM | POA: Diagnosis not present

## 2016-06-02 DIAGNOSIS — N182 Chronic kidney disease, stage 2 (mild): Secondary | ICD-10-CM

## 2016-06-02 DIAGNOSIS — Z125 Encounter for screening for malignant neoplasm of prostate: Secondary | ICD-10-CM

## 2016-06-02 DIAGNOSIS — Z79899 Other long term (current) drug therapy: Secondary | ICD-10-CM

## 2016-06-02 DIAGNOSIS — I10 Essential (primary) hypertension: Secondary | ICD-10-CM | POA: Diagnosis not present

## 2016-06-02 DIAGNOSIS — E1122 Type 2 diabetes mellitus with diabetic chronic kidney disease: Secondary | ICD-10-CM

## 2016-06-02 DIAGNOSIS — F5221 Male erectile disorder: Secondary | ICD-10-CM

## 2016-06-02 DIAGNOSIS — E782 Mixed hyperlipidemia: Secondary | ICD-10-CM | POA: Diagnosis not present

## 2016-06-02 DIAGNOSIS — E559 Vitamin D deficiency, unspecified: Secondary | ICD-10-CM | POA: Diagnosis not present

## 2016-06-02 DIAGNOSIS — E1143 Type 2 diabetes mellitus with diabetic autonomic (poly)neuropathy: Secondary | ICD-10-CM | POA: Diagnosis not present

## 2016-06-02 DIAGNOSIS — Z136 Encounter for screening for cardiovascular disorders: Secondary | ICD-10-CM | POA: Diagnosis not present

## 2016-06-02 DIAGNOSIS — G4733 Obstructive sleep apnea (adult) (pediatric): Secondary | ICD-10-CM

## 2016-06-02 DIAGNOSIS — Z1212 Encounter for screening for malignant neoplasm of rectum: Secondary | ICD-10-CM

## 2016-06-02 LAB — CBC WITH DIFFERENTIAL/PLATELET
Basophils Absolute: 59 cells/uL (ref 0–200)
Basophils Relative: 1 %
Eosinophils Absolute: 177 cells/uL (ref 15–500)
Eosinophils Relative: 3 %
HCT: 45.7 % (ref 38.5–50.0)
Hemoglobin: 14.6 g/dL (ref 13.2–17.1)
Lymphocytes Relative: 34 %
Lymphs Abs: 2006 cells/uL (ref 850–3900)
MCH: 26.6 pg — ABNORMAL LOW (ref 27.0–33.0)
MCHC: 31.9 g/dL — ABNORMAL LOW (ref 32.0–36.0)
MCV: 83.2 fL (ref 80.0–100.0)
MPV: 9.2 fL (ref 7.5–12.5)
Monocytes Absolute: 649 cells/uL (ref 200–950)
Monocytes Relative: 11 %
Neutro Abs: 3009 cells/uL (ref 1500–7800)
Neutrophils Relative %: 51 %
Platelets: 311 10*3/uL (ref 140–400)
RBC: 5.49 MIL/uL (ref 4.20–5.80)
RDW: 14.2 % (ref 11.0–15.0)
WBC: 5.9 10*3/uL (ref 3.8–10.8)

## 2016-06-02 LAB — PSA: PSA: 0.8 ng/mL (ref <=4.0)

## 2016-06-02 LAB — TSH: TSH: 1.79 mIU/L (ref 0.40–4.50)

## 2016-06-02 MED ORDER — SILDENAFIL CITRATE 20 MG PO TABS: ORAL_TABLET | ORAL | 99 refills | Status: AC

## 2016-06-02 NOTE — Patient Instructions (Signed)

## 2016-06-02 NOTE — Progress Notes (Signed)
[facility]   Lucky Cowboy, M.D.    Dyanne Carrel. Steffanie Dunn, P.A.-C      Terri Piedra, P.A.-C  [facility]                [address]                [place], [place]. [address] Telephone [phone] Telefax [phone]  Comprehensive Evaluation & Examination     This very nice 60 y.o. single BM presents for a comprehensive evaluation and management of multiple medical co-morbidities.  Patient has been followed for HTN, T2_NIDDM w/CKD2, Hyperlipidemia and Vitamin D Deficiency. Patient also has OSA on CPAP alleging compliance and reporting improved sleep hygiene and less daytime fatigue. 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      HTN predates since 2010. Patient's BP has been controlled at home.Today's BP is 144/82. Patient denies any cardiac symptoms as chest pain, palpitations, shortness of breath, dizziness or ankle swelling.     Patient's hyperlipidemia is controlled with diet and medications. Patient denies myalgias or other medication SE's. Last lipids were at goal: Lab Results  Component Value Date   CHOL 171 02/29/2016   HDL 51 02/29/2016   LDLCALC 104 02/29/2016   TRIG 79 02/29/2016   CHOLHDL 3.4 02/29/2016      Patient has T2_NIDDM w/CKD2 predating since 2013 and patient denies reactive hypoglycemic symptoms, visual blurring, diabetic polys or paresthesias. He was hospitalized in 2014 with Hemiballismus from non-ketotic hyperosmolar hyperglycemia (Glu>1000 mg%).  Last A1c was not at goal: Lab Results  Component Value Date   HGBA1C 6.7 (H) 02/29/2016       Finally, patient has history of Vitamin D Deficiency  and last vitamin D was at goal: Lab Results  Component Value Date   VD25OH 68 11/16/2015   Current Outpatient Prescriptions on File Prior to Visit  Medication Sig  . CARTIA XT 120 MG 24 hr capsule TAKE  1 CAPSULE EVERY DAY FOR BLOOD PRESSURE  . VITAMIN D 5000 UNITS TABS Take 1 tablet by mouth daily.  . dapagliflozin (FARXIGA) 10 MG Take 10 mg by mouth daily.  Marland Kitchen glipiZIDE  5 MG tablet TAKE 1/2-1 TAB THREE TIMES DAILY  FOR  DIABETES  . losartan  100 MG tablet TAKE 1 TABLET EVERY DAY FOR BLOOD PRESSURE  . Magnesium-Zinc  Take 400 mg by mouth daily.   . metFORMIN-XR 500 MG 24 hr tablet TAKE 2 TABLETS TWICE DAILY  FOR  DIABETES  . Multiple Vitamins-Minerals  Take 1 tablet by mouth daily.  . Omega-3 FISH OIL 500 MG CAPS Take by mouth daily.  . pravastatin  40 MG tablet TAKE 1 TABLET EVERY DAY   No Known Allergies   Past Medical History:  Diagnosis Date  . Vitamin D deficiency    Health Maintenance  Topic Date Due  . Hepatitis C Screening  01/10/1956  . HIV Screening  11/17/1970  . ZOSTAVAX  11/17/2015  . OPHTHALMOLOGY EXAM  02/14/2016  . FOOT EXAM  05/06/2016  . HEMOGLOBIN A1C  08/31/2016  . PNEUMOCOCCAL POLYSACCHARIDE VACCINE (2) 02/16/2018  . TETANUS/TDAP  02/09/2021  . COLONOSCOPY  03/10/2022   Immunization History  Administered Date(s) Administered  . Pneumococcal-Unspecified 02/16/2013  . Tdap 02/10/2011   Past Surgical History:  Procedure Laterality Date  . CARPAL TUNNEL RELEASE  2008   bilateral  .  CERVICAL DISCECTOMY  2003  . KNEE ARTHROSCOPY  2010   left   Family History  Problem Relation Age of Onset  . Colon cancer Neg Hx   . Stomach cancer Neg Hx   . Diabetes Father   . Hypertension Father   . Diabetes Sister   . Kidney disease Sister   . Heart disease Mother   . Stroke Mother   . Diabetes Mother    Social History  . Marital status: Single   Occupational History  . SS Disability as above   Social History Main Topics  . Smoking status: Former Smoker    Quit date: 02/25/1991  . Smokeless tobacco: Never Used  . Alcohol use No  . Drug use: No  . Sexual activity: Active    ROS Constitutional: Denies fever, chills, weight loss/gain, headaches,  insomnia,  night sweats or change in appetite. Does c/o fatigue. Eyes: Denies redness, blurred vision, diplopia, discharge, itchy or watery eyes.  ENT: Denies discharge, congestion, post nasal drip, epistaxis, sore throat, earache, hearing loss, dental pain, Tinnitus, Vertigo, Sinus pain or snoring.  Cardio: Denies chest pain, palpitations, irregular heartbeat, syncope, dyspnea, diaphoresis, orthopnea, PND, claudication or edema Respiratory: denies cough, dyspnea, DOE, pleurisy, hoarseness, laryngitis or wheezing.  Gastrointestinal: Denies dysphagia, heartburn, reflux, water brash, pain, cramps, nausea, vomiting, bloating, diarrhea, constipation, hematemesis, melena, hematochezia, jaundice or hemorrhoids Genitourinary: Denies dysuria, frequency, urgency, nocturia, hesitancy, discharge, hematuria or flank pain Musculoskeletal: Denies arthralgia, myalgia, stiffness, Jt. Swelling, pain, limp or strain/sprain. Denies Falls. Skin: Denies puritis, rash, hives, warts, acne, eczema or change in skin lesion Neuro: No weakness, tremor, incoordination, spasms, paresthesia or pain Psychiatric: Denies confusion, memory loss or sensory loss. Denies Depression. Endocrine: Denies change in weight, skin, hair change, nocturia, and paresthesia, diabetic polys, visual blurring or hyper / hypo glycemic episodes.  Heme/Lymph: No excessive bleeding, bruising or enlarged lymph nodes.  Physical Exam  BP (!) 144/82   Pulse 92   Temp 97.5 F (36.4 C)   Resp 16   Ht 5' 8.75" (1.746 m)   Wt 294 lb (133.4 kg)   BMI 43.73 kg/m   General Appearance: Over nourished with central obesity, in no apparent distress.  Eyes: PERRLA, EOMs, conjunctiva no swelling or erythema, normal fundi and vessels. Sinuses: No frontal/maxillary tenderness ENT/Mouth: EACs patent / TMs  nl. Nares clear without erythema, swelling, mucoid exudates. Oral hygiene is good. No erythema, swelling, or exudate. Tongue normal, non-obstructing. Tonsils  not swollen or erythematous. Hearing normal.  Neck: Supple, thyroid normal. No bruits, nodes or JVD. Respiratory: Respiratory effort normal.  BS equal and clear bilateral without rales, rhonci, wheezing or stridor. Cardio: Heart sounds are normal with regular rate and rhythm and no murmurs, rubs or gallops. Peripheral pulses are normal and equal bilaterally without edema. No aortic or femoral bruits. Chest: symmetric with normal excursions and percussion.  Abdomen: Soft, rotund with Nl bowel sounds. Nontender, no guarding, rebound, hernias, masses, or organomegaly.  Lymphatics: Non tender without lymphadenopathy.  Genitourinary: DRE - declined. Musculoskeletal: Full ROM all peripheral extremities, joint stability, 5/5 strength, and normal gait. Skin: Warm and dry without rashes, lesions, cyanosis, clubbing or  ecchymosis.  Neuro: Cranial nerves intact, reflexes equal bilaterally. Normal muscle tone, no cerebellar symptoms. Sensation intact to touch, vibratory and Monofilament to the toes bilaterally. Pysch: Alert and oriented X 3 with normal affect, insight and judgment appropriate.   Assessment and Plan  1. Essential hypertension  - Microalbumin / creatinine urine ratio - EKG 12-Lead -  [place], RETROPERITNL ABD,  LTD - TSH  2. Mixed hyperlipidemia  - Lipid panel - TSH  3. Type 2 diabetes mellitus with stage 2 chronic kidney disease, without long-term current use of insulin (HCC)  - Microalbumin / creatinine urine ratio - HM DIABETES FOOT EXAM - LOW EXTREMITY NEUR EXAM DOCUM - Hemoglobin A1c - Insulin, random  4. Gastroesophageal reflux disease  - VITAMIN D 25 Hydroxy   5. Morbid obesity (HCC)   6. Screening for rectal cancer  - POC Hemoccult Bld/Stl   7. Prostate cancer screening  - PSA  8. Diabetic autonomic neuropathy associated with type 2 diabetes mellitus (HCC)   9. Obstructive sleep apnea   10. Screening for ischemic heart disease   11. Screening for AAA  (aortic abdominal aneurysm)   12. Medication management  - Urinalysis, Routine w reflex microscopic - CBC with Differential/Platelet - BASIC METABOLIC PANEL WITH GFR - Hepatic function panel - Magnesium  13. Erectile dysfunction of non-organic origin       Continue prudent diet as discussed, weight control, BP monitoring, regular exercise, and medications as discussed.  Discussed med effects and SE's. Routine screening labs and tests as requested with regular follow-up as recommended. Over 40 minutes of exam, counseling, chart review and high complex critical decision making was performed

## 2016-06-03 LAB — LIPID PANEL
Cholesterol: 177 mg/dL (ref 125–200)
HDL: 43 mg/dL (ref >=40)
LDL Cholesterol: 105 mg/dL (ref <130)
Total CHOL/HDL Ratio: 4.1 Ratio (ref <=5.0)
Triglycerides: 144 mg/dL (ref <150)
VLDL: 29 mg/dL (ref <30)

## 2016-06-03 LAB — MICROALBUMIN / CREATININE URINE RATIO
Creatinine, Urine: 98 mg/dL (ref 20–370)
Microalb Creat Ratio: 60 mcg/mg creat — ABNORMAL HIGH (ref <30)
Microalb, Ur: 5.9 mg/dL

## 2016-06-03 LAB — URINALYSIS, ROUTINE W REFLEX MICROSCOPIC
Bilirubin Urine: NEGATIVE
Hgb urine dipstick: NEGATIVE
Ketones, ur: NEGATIVE
Leukocytes, UA: NEGATIVE
Nitrite: NEGATIVE
Protein, ur: NEGATIVE
Specific Gravity, Urine: 1.026 (ref 1.001–1.035)
pH: 7 (ref 5.0–8.0)

## 2016-06-03 LAB — BASIC METABOLIC PANEL WITH GFR
BUN: 16 mg/dL (ref 7–25)
CO2: 21 mmol/L (ref 20–31)
Calcium: 9.4 mg/dL (ref 8.6–10.3)
Chloride: 102 mmol/L (ref 98–110)
Creat: 1.02 mg/dL (ref 0.70–1.25)
GFR, Est African American: >89 mL/min (ref >=60)
GFR, Est Non African American: 80 mL/min (ref >=60)
Glucose, Bld: 91 mg/dL (ref 65–99)
Potassium: 4.1 mmol/L (ref 3.5–5.3)
Sodium: 139 mmol/L (ref 135–146)

## 2016-06-03 LAB — HEPATIC FUNCTION PANEL
ALT: 10 U/L (ref 9–46)
AST: 20 U/L (ref 10–35)
Albumin: 4 g/dL (ref 3.6–5.1)
Alkaline Phosphatase: 73 U/L (ref 40–115)
Bilirubin, Direct: 0.1 mg/dL (ref <=0.2)
Indirect Bilirubin: 0.3 mg/dL (ref 0.2–1.2)
Total Bilirubin: 0.4 mg/dL (ref 0.2–1.2)
Total Protein: 7.8 g/dL (ref 6.1–8.1)

## 2016-06-03 LAB — URINALYSIS, MICROSCOPIC ONLY
Bacteria, UA: NONE SEEN [HPF]
Casts: NONE SEEN [LPF]
Crystals: NONE SEEN [HPF]
RBC / HPF: NONE SEEN RBC/HPF (ref <=2)
Squamous Epithelial / LPF: NONE SEEN [HPF] (ref <=5)
WBC, UA: NONE SEEN WBC/HPF (ref <=5)
Yeast: NONE SEEN [HPF]

## 2016-06-03 LAB — VITAMIN D 25 HYDROXY (VIT D DEFICIENCY, FRACTURES): Vit D, 25-Hydroxy: 65 ng/mL (ref 30–100)

## 2016-06-03 LAB — HEMOGLOBIN A1C
Hgb A1c MFr Bld: 6.5 % — ABNORMAL HIGH (ref <5.7)
Mean Plasma Glucose: 140 mg/dL

## 2016-06-03 LAB — INSULIN, RANDOM: Insulin: 35.7 u[IU]/mL — ABNORMAL HIGH (ref 2.0–19.6)

## 2016-06-03 LAB — MAGNESIUM: Magnesium: 2.1 mg/dL (ref 1.5–2.5)

## 2016-06-04 ENCOUNTER — Other Ambulatory Visit: Payer: Self-pay | Admitting: *Deleted

## 2016-06-04 MED ORDER — PHENTERMINE HCL 37.5 MG PO CAPS: ORAL_CAPSULE | ORAL | 5 refills | Status: DC

## 2016-06-06 ENCOUNTER — Encounter (HOSPITAL_COMMUNITY): Payer: Self-pay | Admitting: Emergency Medicine

## 2016-06-06 ENCOUNTER — Ambulatory Visit (HOSPITAL_COMMUNITY)
Admission: EM | Admit: 2016-06-06 | Discharge: 2016-06-06 | Disposition: A | Discharge status: Home / Self Care | Payer: Commercial Managed Care - HMO | Attending: Family Medicine | Admitting: Family Medicine

## 2016-06-06 DIAGNOSIS — L03011 Cellulitis of right finger: Secondary | ICD-10-CM | POA: Diagnosis not present

## 2016-06-06 MED ORDER — DOXYCYCLINE HYCLATE 100 MG PO TABS: 100.0000 mg | ORAL_TABLET | Freq: Two times a day (BID) | ORAL | 0 refills | Status: DC

## 2016-06-06 NOTE — ED Triage Notes (Signed)
Pt c/o infection around nail bed of right index finger onset 1 week  Sx today include pain  Denies fevers, drainage  A&O x4... NAD

## 2016-06-06 NOTE — ED Provider Notes (Signed)
[facility]    CSN: [id number] Arrival date & time: 06/06/16  1357     History   Chief Complaint Chief Complaint  Patient presents with  . Hand Pain    HPI [name] [name] is a 60 y.o. male.   This is a retired 60 year old gentleman who comes in with several days of increasing pain in his right index finger at the junction of the cuticle with a nail. He's had no fever and no trauma to the finger. He notices that there is a large collection of pus at the base of the nail.      Past Medical History:  Diagnosis Date  . Vitamin D deficiency     Patient Active Problem List   Diagnosis Date Noted  . Aneurysm artery, iliac common (HCC) 01/23/2015  . Chronic pain syndrome 01/23/2015  . Peripheral sensory neuropathy due to T2_NIDDM 01/23/2015  . Erectile dysfunction 01/23/2015  . T2_NIDDM w/ Stage 2 CKD (GFR 77  ml/min) 06/06/2014  . Medication management 06/06/2014  . Vitamin D deficiency   . HTN (hypertension) 09/29/2012  . Mixed hyperlipidemia 09/29/2012  . GERD (gastroesophageal reflux disease) 09/29/2012  . Morbid obesity (BMI 45+) 09/29/2012    Past Surgical History:  Procedure Laterality Date  . CARPAL TUNNEL RELEASE  2008   bilateral  . CERVICAL DISCECTOMY  2003  . KNEE ARTHROSCOPY  2010   left       Home Medications    Prior to Admission medications   Medication Sig Start Date End Date Taking? Authorizing Provider  glipiZIDE (GLUCOTROL) 5 MG tablet TAKE 1/2 TO 1 TABLET THREE TIMES DAILY  FOR  DIABETES 04/28/16  Yes Courtney Forcucci, PA-C  losartan (COZAAR) 100 MG tablet TAKE 1 TABLET EVERY DAY FOR BLOOD PRESSURE 04/28/16  Yes Courtney Forcucci, PA-C  metFORMIN (GLUCOPHAGE-XR) 500 MG 24 hr tablet TAKE 2 TABLETS TWICE DAILY  FOR  DIABETES 12/28/15  Yes Lucky CowboyWilliam McKeown, MD  pravastatin (PRAVACHOL) 40 MG tablet TAKE 1 TABLET EVERY DAY (REPLACES SIMVASTATIN) 12/25/15  Yes Lucky CowboyWilliam McKeown, MD  CARTIA XT 120 MG 24 hr capsule TAKE 1 CAPSULE EVERY DAY  FOR BLOOD PRESSURE 12/25/15   Lucky CowboyWilliam McKeown, MD  Cholecalciferol (VITAMIN D-3) 5000 UNITS TABS Take 1 tablet by mouth daily.    Historical Provider, MD  dapagliflozin propanediol (FARXIGA) 10 MG TABS tablet Take 10 mg by mouth daily. 02/29/16   Courtney Forcucci, PA-C  doxycycline (VIBRA-TABS) 100 MG tablet Take 1 tablet (100 mg total) by mouth 2 (two) times daily. 06/06/16   Elvina SidleKurt Elisha Mcgruder, MD  glucose blood (ACCU-CHEK SMARTVIEW) test strip Use as instructed 11/07/14   Lucky CowboyWilliam McKeown, MD  Magnesium-Zinc (MAGNESIUM-CHELATED ZINC PO) Take 400 mg by mouth daily.     Historical Provider, MD  Multiple Vitamins-Minerals (THERAGRAN-M PREMIER 50 PLUS PO) Take 1 tablet by mouth daily.    Historical Provider, MD  Omega-3 Fatty Acids (FISH OIL) 500 MG CAPS Take by mouth daily.    Historical Provider, MD  phentermine 37.5 MG capsule Take 1/2 to 1 tablet every morning for dieting and weight loss. 06/04/16   Lucky CowboyWilliam McKeown, MD  sildenafil (REVATIO) 20 MG tablet Take 1 to 5 tablets daily as needed for XXXX 06/02/16 06/02/17  Lucky CowboyWilliam McKeown, MD    Family History Family History  Problem Relation Age of Onset  . Diabetes Father   . Hypertension Father   . Diabetes Sister   . Kidney disease Sister   . Heart disease Mother   .  Stroke Mother   . Diabetes Mother   . Colon cancer Neg Hx   . Stomach cancer Neg Hx     Social History Social History  Substance Use Topics  . Smoking status: Former Smoker    Quit date: 02/25/1991  . Smokeless tobacco: Never Used  . Alcohol use No     Allergies   Review of patient's allergies indicates no known allergies.   Review of Systems Review of Systems  Constitutional: Negative.   HENT: Negative.      Physical Exam Triage Vital Signs ED Triage Vitals  Enc Vitals Group     BP 06/06/16 1408 140/68     Pulse Rate 06/06/16 1408 95     Resp 06/06/16 1408 14     Temp 06/06/16 1408 98.9 F (37.2 C)     Temp Source 06/06/16 1408 Oral     SpO2 06/06/16  1408 96 %     Weight --      Height --      Head Circumference --      Peak Flow --      Pain Score 06/06/16 1424 10     Pain Loc --      Pain Edu? --      Excl. in GC? --    No data found.   Updated Vital Signs BP 140/68 (BP Location: Left Arm)   Pulse 95   Temp 98.9 F (37.2 C) (Oral)   Resp 14   SpO2 96%    Physical Exam  Constitutional: He appears well-developed and well-nourished.  HENT:  Head: Normocephalic.  Eyes: Conjunctivae and EOM are normal. Pupils are equal, round, and reactive to light.  Neck: Normal range of motion. Neck supple.  Pulmonary/Chest: Effort normal.  Musculoskeletal:  Patient has a paronychia at the base of his right index finger. As was prepped with iodine and then lanced with a large amount of pus expressed. Patient experienced relief after the procedure.  Skin: Skin is warm and dry.  Nursing note and vitals reviewed.    UC Treatments / Results  Labs (all labs ordered are listed, but only abnormal results are displayed) Labs Reviewed - No data to display  EKG  EKG Interpretation None       Radiology No results found.  Procedures Procedures (including critical care time)  Medications Ordered in UC Medications - No data to display   Initial Impression / Assessment and Plan / UC Course  I have reviewed the triage vital signs and the nursing notes.  Pertinent labs & imaging results that were available during my care of the patient were reviewed by me and considered in my medical decision making (see chart for details).  Clinical Course    Final Clinical Impressions(s) / UC Diagnoses   Final diagnoses:  Paronychia of right index finger    New Prescriptions New Prescriptions   DOXYCYCLINE (VIBRA-TABS) 100 MG TABLET    Take 1 tablet (100 mg total) by mouth 2 (two) times daily.     Elvina Sidle, MD 06/06/16 1444

## 2016-06-19 ENCOUNTER — Other Ambulatory Visit: Payer: Self-pay | Admitting: Internal Medicine

## 2016-06-19 MED ORDER — GABAPENTIN 100 MG PO CAPS: ORAL_CAPSULE | ORAL | 0 refills | Status: DC

## 2016-06-27 ENCOUNTER — Other Ambulatory Visit: Payer: Self-pay | Admitting: *Deleted

## 2016-06-27 DIAGNOSIS — Z1212 Encounter for screening for malignant neoplasm of rectum: Secondary | ICD-10-CM

## 2016-06-27 LAB — POC HEMOCCULT BLD/STL (HOME/3-CARD/SCREEN)
Card #2 Fecal Occult Blod, POC: NEGATIVE
Card #3 Fecal Occult Blood, POC: NEGATIVE
Fecal Occult Blood, POC: NEGATIVE

## 2016-07-04 ENCOUNTER — Other Ambulatory Visit: Payer: Self-pay | Admitting: *Deleted

## 2016-07-04 MED ORDER — DAPAGLIFLOZIN PROPANEDIOL 10 MG PO TABS: 10.0000 mg | ORAL_TABLET | Freq: Every day | ORAL | 3 refills | Status: DC

## 2016-07-14 ENCOUNTER — Other Ambulatory Visit: Payer: Self-pay | Admitting: Internal Medicine

## 2016-07-14 MED ORDER — PHENTERMINE HCL 37.5 MG PO TABS: ORAL_TABLET | ORAL | 5 refills | Status: DC

## 2016-08-08 DIAGNOSIS — H5203 Hypermetropia, bilateral: Secondary | ICD-10-CM | POA: Diagnosis not present

## 2016-08-08 DIAGNOSIS — H52209 Unspecified astigmatism, unspecified eye: Secondary | ICD-10-CM | POA: Diagnosis not present

## 2016-08-08 DIAGNOSIS — H524 Presbyopia: Secondary | ICD-10-CM | POA: Diagnosis not present

## 2016-08-22 ENCOUNTER — Encounter: Payer: Self-pay | Admitting: *Deleted

## 2016-08-25 ENCOUNTER — Other Ambulatory Visit: Payer: Self-pay | Admitting: Internal Medicine

## 2016-09-04 ENCOUNTER — Ambulatory Visit: Payer: Self-pay | Admitting: Physician Assistant

## 2016-09-06 DIAGNOSIS — H5203 Hypermetropia, bilateral: Secondary | ICD-10-CM | POA: Diagnosis not present

## 2016-09-06 DIAGNOSIS — H524 Presbyopia: Secondary | ICD-10-CM | POA: Diagnosis not present

## 2016-09-06 DIAGNOSIS — H52209 Unspecified astigmatism, unspecified eye: Secondary | ICD-10-CM | POA: Diagnosis not present

## 2016-09-29 ENCOUNTER — Other Ambulatory Visit: Payer: Self-pay | Admitting: Internal Medicine

## 2016-10-07 ENCOUNTER — Ambulatory Visit (INDEPENDENT_AMBULATORY_CARE_PROVIDER_SITE_OTHER): Payer: Medicare HMO | Admitting: Physician Assistant

## 2016-10-07 ENCOUNTER — Encounter: Payer: Self-pay | Admitting: Physician Assistant

## 2016-10-07 VITALS — BP 136/74 | HR 97 | Temp 97.3°F | Resp 16 | Ht 68.75 in | Wt 294.8 lb

## 2016-10-07 DIAGNOSIS — E559 Vitamin D deficiency, unspecified: Secondary | ICD-10-CM

## 2016-10-07 DIAGNOSIS — E782 Mixed hyperlipidemia: Secondary | ICD-10-CM | POA: Diagnosis not present

## 2016-10-07 DIAGNOSIS — E1122 Type 2 diabetes mellitus with diabetic chronic kidney disease: Secondary | ICD-10-CM | POA: Diagnosis not present

## 2016-10-07 DIAGNOSIS — Z79899 Other long term (current) drug therapy: Secondary | ICD-10-CM

## 2016-10-07 DIAGNOSIS — E668 Other obesity: Secondary | ICD-10-CM

## 2016-10-07 DIAGNOSIS — E1142 Type 2 diabetes mellitus with diabetic polyneuropathy: Secondary | ICD-10-CM | POA: Diagnosis not present

## 2016-10-07 DIAGNOSIS — N182 Chronic kidney disease, stage 2 (mild): Secondary | ICD-10-CM

## 2016-10-07 DIAGNOSIS — E6689 Other obesity not elsewhere classified: Secondary | ICD-10-CM

## 2016-10-07 LAB — CBC WITH DIFFERENTIAL/PLATELET
Basophils Absolute: 41 cells/uL (ref 0–200)
Basophils Relative: 1 %
Eosinophils Absolute: 205 cells/uL (ref 15–500)
Eosinophils Relative: 5 %
HCT: 48.4 % (ref 38.5–50.0)
Hemoglobin: 15.9 g/dL (ref 13.2–17.1)
Lymphocytes Relative: 41 %
Lymphs Abs: 1681 cells/uL (ref 850–3900)
MCH: 26.8 pg — ABNORMAL LOW (ref 27.0–33.0)
MCHC: 32.9 g/dL (ref 32.0–36.0)
MCV: 81.5 fL (ref 80.0–100.0)
MPV: 9.4 fL (ref 7.5–12.5)
Monocytes Absolute: 574 cells/uL (ref 200–950)
Monocytes Relative: 14 %
Neutro Abs: 1599 cells/uL (ref 1500–7800)
Neutrophils Relative %: 39 %
Platelets: 299 10*3/uL (ref 140–400)
RBC: 5.94 MIL/uL — ABNORMAL HIGH (ref 4.20–5.80)
RDW: 14.1 % (ref 11.0–15.0)
WBC: 4.1 10*3/uL (ref 3.8–10.8)

## 2016-10-07 LAB — HEPATIC FUNCTION PANEL
ALT: 13 U/L (ref 9–46)
AST: 27 U/L (ref 10–35)
Albumin: 4.2 g/dL (ref 3.6–5.1)
Alkaline Phosphatase: 67 U/L (ref 40–115)
Bilirubin, Direct: 0.1 mg/dL (ref <=0.2)
Indirect Bilirubin: 0.3 mg/dL (ref 0.2–1.2)
Total Bilirubin: 0.4 mg/dL (ref 0.2–1.2)
Total Protein: 8 g/dL (ref 6.1–8.1)

## 2016-10-07 LAB — LIPID PANEL
Cholesterol: 175 mg/dL (ref <200)
HDL: 50 mg/dL (ref >40)
LDL Cholesterol: 104 mg/dL — ABNORMAL HIGH (ref <100)
Total CHOL/HDL Ratio: 3.5 Ratio (ref <5.0)
Triglycerides: 103 mg/dL (ref <150)
VLDL: 21 mg/dL (ref <30)

## 2016-10-07 LAB — BASIC METABOLIC PANEL WITH GFR
BUN: 19 mg/dL (ref 7–25)
CO2: 24 mmol/L (ref 20–31)
Calcium: 9.9 mg/dL (ref 8.6–10.3)
Chloride: 104 mmol/L (ref 98–110)
Creat: 1.14 mg/dL (ref 0.70–1.25)
GFR, Est African American: 80 mL/min (ref >=60)
GFR, Est Non African American: 70 mL/min (ref >=60)
Glucose, Bld: 76 mg/dL (ref 65–99)
Potassium: 4.4 mmol/L (ref 3.5–5.3)
Sodium: 139 mmol/L (ref 135–146)

## 2016-10-07 LAB — TSH: TSH: 1.95 mIU/L (ref 0.40–4.50)

## 2016-10-07 NOTE — Patient Instructions (Addendum)
Can take zinc 40mg  a day for testosterone Can do supplemental whey protein Continue working out and weight loss Get 80-100 oz of water a day  9 Ways to Naturally Increase Testosterone Levels  1.   Lose Weight If you're overweight, shedding the excess pounds may increase your testosterone levels, according to research presented at the Endocrine Society's 2012 meeting. Overweight men are more likely to have low testosterone levels to begin with, so this is an important trick to increase your body's testosterone production when you need it most.  2.   High-Intensity Exercise like Peak Fitness  Short intense exercise has a proven positive effect on increasing testosterone levels and preventing its decline. That's unlike aerobics or prolonged moderate exercise, which have shown to have negative or no effect on testosterone levels. Having a whey protein meal after exercise can further enhance the satiety/testosterone-boosting impact (hunger hormones cause the opposite effect on your testosterone and libido). Here's a summary of what a typical high-intensity Peak Fitness routine might look like: " Warm up for three minutes  " Exercise as hard and fast as you can for 30 seconds. You should feel like you couldn't possibly go on another few seconds  " Recover at a slow to moderate pace for 90 seconds  " Repeat the high intensity exercise and recovery 7 more times .  3.   Consume Plenty of Zinc The mineral zinc is important for testosterone production, and supplementing your diet for as little as six weeks has been shown to cause a marked improvement in testosterone among men with low levels.1 Likewise, research has shown that restricting dietary sources of zinc leads to a significant decrease in testosterone, while zinc supplementation increases it2 -- and even protects men from exercised-induced reductions in testosterone levels.3 It's estimated that up to 45 percent of adults over the age of 60 may have  lower than recommended zinc intakes; even when dietary supplements were added in, an estimated 20-25 percent of older adults still had inadequate zinc intakes, according to a [employer] and Nutrition Examination Survey.4 Your diet is the best source of zinc; along with protein-rich foods like meats and fish, other good dietary sources of zinc include raw milk, raw cheese, beans, and yogurt or kefir made from raw milk. It can be difficult to obtain enough dietary zinc if you're a vegetarian, and also for meat-eaters as well, largely because of conventional farming methods that rely heavily on chemical fertilizers and pesticides. These chemicals deplete the soil of nutrients ... nutrients like zinc that must be absorbed by plants in order to be passed on to you. In many cases, you may further deplete the nutrients in your food by the way you prepare it. For most food, cooking it will drastically reduce its levels of nutrients like zinc . particularly over-cooking, which many people do. If you decide to use a zinc supplement, stick to a dosage of less than 40 mg a day, as this is the recommended adult upper limit. Taking too much zinc can interfere with your body's ability to absorb other minerals, especially copper, and may cause nausea as a side effect.  4.   Strength Training In addition to Peak Fitness, strength training is also known to boost testosterone levels, provided you are doing so intensely enough. When strength training to boost testosterone, you'll want to increase the weight and lower your number of reps, and then focus on exercises that work a large number of muscles, such as dead lifts  or squats.  You can "turbo-charge" your weight training by going slower. By slowing down your movement, you're actually turning it into a high-intensity exercise. Super Slow movement allows your muscle, at the microscopic level, to access the maximum number of cross-bridges between the protein filaments that  produce movement in the muscle.   5.   Optimize Your Vitamin D Levels Vitamin D, a steroid hormone, is essential for the healthy development of the nucleus of the sperm cell, and helps maintain semen quality and sperm count. Vitamin D also increases levels of testosterone, which may boost libido. In one study, overweight men who were given vitamin D supplements had a significant increase in testosterone levels after one year.5   6.   Reduce Stress When you're under a lot of stress, your body releases high levels of the stress hormone cortisol. This hormone actually blocks the effects of testosterone,6 presumably because, from a biological standpoint, testosterone-associated behaviors (mating, competing, aggression) may have lowered your chances of survival in an emergency (hence, the "fight or flight" response is dominant, courtesy of cortisol).  7.   Limit or Eliminate Sugar from Your Diet Testosterone levels decrease after you eat sugar, which is likely because the sugar leads to a high insulin level, another factor leading to low testosterone.7 Based on USDA estimates, the average American consumes 12 teaspoons of sugar a day, which equates to about TWO TONS of sugar during a lifetime.  8.   Eat Healthy Fats By healthy, this means not only mon- and polyunsaturated fats, like that found in avocadoes and nuts, but also saturated, as these are essential for building testosterone. Research shows that a diet with less than 40 percent of energy as fat (and that mainly from animal sources, i.e. saturated) lead to a decrease in testosterone levels.8 My personal diet is about 60-70 percent healthy fat, and other experts agree that the ideal diet includes somewhere between 50-70 percent fat.  It's important to understand that your body requires saturated fats from animal and vegetable sources (such as meat, dairy, certain oils, and tropical plants like coconut) for optimal functioning, and if you neglect  this important food group in favor of sugar, grains and other starchy carbs, your health and weight are almost guaranteed to suffer. Examples of healthy fats you can eat more of to give your testosterone levels a boost include: Olives and Olive oil  Coconuts and coconut oil Butter made from raw grass-fed organic milk Raw nuts, such as, almonds or pecans Organic pastured egg yolks Avocados Grass-fed meats Palm oil Unheated organic nut oils   9.   Boost Your Intake of Branch Chain Amino Acids (BCAA) from Foods Like Whey Protein Research suggests that BCAAs result in higher testosterone levels, particularly when taken along with resistance training.9 While BCAAs are available in supplement form, you'll find the highest concentrations of BCAAs like leucine in dairy products - especially quality cheeses and whey protein. Even when getting leucine from your natural food supply, it's often wasted or used as a building block instead of an anabolic agent. So to create the correct anabolic environment, you need to boost leucine consumption way beyond mere maintenance levels. That said, keep in mind that using leucine as a free form amino acid can be highly counterproductive as when free form amino acids are artificially administrated, they rapidly enter your circulation while disrupting insulin function, and impairing your body's glycemic control. Food-based leucine is really the ideal form that can benefit your muscles without side  effects.

## 2016-10-07 NOTE — Progress Notes (Signed)
Assessment and Plan:   Type 2 diabetes mellitus with stage 2 chronic kidney disease, without long-term current use of insulin (HCC) Discussed general issues about diabetes pathophysiology and management., Educational material distributed., Suggested low cholesterol diet., Encouraged aerobic exercise., Discussed foot care., Reminded to get yearly retinal exam. -     Hemoglobin A1c  Peripheral sensory neuropathy due to T2_NIDDM Check feet daily -     Hemoglobin A1c  Morbid Obesity with co morbidities - long discussion about weight loss, diet, and exercise -     TSH  Mixed hyperlipidemia -continue medications, check lipids, decrease fatty foods, increase activity.  -     TSH -     Lipid panel  Vitamin D deficiency -     VITAMIN D 25 Hydroxy (Vit-D Deficiency, Fractures)  Medication management -     CBC with Differential/Platelet -     BASIC METABOLIC PANEL WITH GFR -     Hepatic function panel -     Magnesium  Hypogonadal obesity -     Testosterone - add zinc 40mg , continue weight loss, check next OV if low will consider shots   Continue diet and meds as discussed. Further disposition pending results of labs. Discussed med's effects and SE's.   Over 30 minutes of exam, counseling, chart review, and critical decision making was performed Future Appointments Date Time Provider Department Center  12/09/2016 10:30 AM Lucky CowboyWilliam McKeown, MD [facility] None  07/02/2017 10:00 AM Lucky CowboyWilliam McKeown, MD [facility] None     HPI 61 y.o. male  presents for 3 month follow up on hypertension, cholesterol, diabetes and vitamin D deficiency.   His blood pressure has been controlled at home, today their BP is BP: 136/74  He does not workout. He denies chest pain, shortness of breath, dizziness.  He is on cholesterol medication and denies myalgias. His cholesterol is not at goal. The cholesterol last visit was:   Lab Results  Component Value Date   CHOL 177 06/02/2016   HDL 43 06/02/2016   LDLCALC 105 06/02/2016   TRIG 144 06/02/2016   CHOLHDL 4.1 06/02/2016   He has been working on diet and exercise for Diabetes with diabetic chronic kidney disease and with diabetic autonomic neuropathy,he has not taken any of the glipizide due to weight los/better sugars, he is on farxiga and metformin, he is on bASA, he is on ACE/ARB, and denies polydipsia, polyuria and visual disturbances. Last A1C was:  Lab Results  Component Value Date   HGBA1C 6.5 (H) 06/02/2016   Patient is on Vitamin D supplement.   Lab Results  Component Value Date   VD25OH 7165 06/02/2016     Was following with worker's comp, now released and following with pain management.  BMI is Body mass index is 43.85 kg/m., he is working on diet and exercise. He has OSA but is not on CPAP, will consider titration.  Wt Readings from Last 3 Encounters:  10/07/16 294 lb 12.8 oz (133.7 kg)  06/02/16 294 lb (133.4 kg)  02/29/16 300 lb (136.1 kg)   He has a history of testosterone deficiency and is on testosterone replacement. He states that the testosterone helps with his energy, libido, muscle mass. Lab Results  Component Value Date   TESTOSTERONE 170 (L) 05/07/2015     Current Medications:  Current Outpatient Prescriptions on File Prior to Visit  Medication Sig  . CARTIA XT 120 MG 24 hr capsule TAKE 1 CAPSULE EVERY DAY FOR BLOOD PRESSURE  . Cholecalciferol (VITAMIN D-3)  5000 UNITS TABS Take 1 tablet by mouth daily.  . dapagliflozin propanediol (FARXIGA) 10 MG TABS tablet Take 10 mg by mouth daily.  Marland Kitchen glipiZIDE (GLUCOTROL) 5 MG tablet TAKE 1/2 TO 1 TABLET THREE TIMES DAILY  FOR  DIABETES  . glucose blood (ACCU-CHEK SMARTVIEW) test strip Use as instructed  . losartan (COZAAR) 100 MG tablet TAKE 1 TABLET EVERY DAY FOR BLOOD PRESSURE  . Magnesium-Zinc (MAGNESIUM-CHELATED ZINC PO) Take 400 mg by mouth daily.   . metFORMIN (GLUCOPHAGE-XR) 500 MG 24 hr tablet TAKE 2 TABLETS TWICE DAILY  FOR  DIABETES  . Multiple  Vitamins-Minerals (THERAGRAN-M PREMIER 50 PLUS PO) Take 1 tablet by mouth daily.  . Omega-3 Fatty Acids (FISH OIL) 500 MG CAPS Take by mouth daily.  . phentermine (ADIPEX-P) 37.5 MG tablet Take 1/2 to 1 tablet every morning for dieting & weightloss  . pravastatin (PRAVACHOL) 40 MG tablet TAKE 1 TABLET EVERY DAY (REPLACES SIMVASTATIN)  . sildenafil (REVATIO) 20 MG tablet Take 1 to 5 tablets daily as needed for XXXX  . gabapentin (NEURONTIN) 100 MG capsule Take 1 tablet 3 x/day for neuropathy pains in hands   No current facility-administered medications on file prior to visit.     Medical History:  Past Medical History:  Diagnosis Date  . Vitamin D deficiency    Allergies: No Known Allergies   Review of Systems:  Review of Systems  Constitutional: Negative.   HENT: Negative.   Eyes: Negative.   Respiratory: Negative.   Cardiovascular: Negative.   Gastrointestinal: Negative.   Genitourinary: Negative.   Musculoskeletal: Negative.   Skin: Negative.   Neurological: Negative.   Endo/Heme/Allergies: Negative.   Psychiatric/Behavioral: Negative.     Family history- Review and unchanged Social history- Review and unchanged Physical Exam: BP 136/74   Pulse 97   Temp 97.3 F (36.3 C)   Resp 16   Ht 5' 8.75" (1.746 m)   Wt 294 lb 12.8 oz (133.7 kg)   SpO2 97%   BMI 43.85 kg/m  Wt Readings from Last 3 Encounters:  10/07/16 294 lb 12.8 oz (133.7 kg)  06/02/16 294 lb (133.4 kg)  02/29/16 300 lb (136.1 kg)   General Appearance: Well nourished, in no apparent distress. Eyes: PERRLA, EOMs, conjunctiva no swelling or erythema Sinuses: No Frontal/maxillary tenderness ENT/Mouth: Ext aud canals clear, TMs without erythema, bulging. No erythema, swelling, or exudate on post pharynx.  Tonsils not swollen or erythematous. Hearing normal.  Neck: Supple, thyroid normal.  Respiratory: Respiratory effort normal, BS equal bilaterally without rales, rhonchi, wheezing or stridor.  Cardio:  RRR with no MRGs. Brisk peripheral pulses without edema.  Abdomen: Soft, obese, + BS.  Non tender, no guarding, rebound, hernias, masses. Lymphatics: Non tender without lymphadenopathy.  Musculoskeletal: Full ROM, 5/5 strength, Normal gait Skin: Warm, dry without rashes, lesions, ecchymosis.  Neuro: Cranial nerves intact. No cerebellar symptoms.  Psych: Awake and oriented X 3, normal affect, Insight and Judgment appropriate.    Quentin Mulling, PA-C 9:37 AM [facility]

## 2016-10-08 LAB — HEMOGLOBIN A1C
Hgb A1c MFr Bld: 6.8 % — ABNORMAL HIGH (ref <5.7)
Mean Plasma Glucose: 148 mg/dL

## 2016-10-08 LAB — TESTOSTERONE: Testosterone: 339 ng/dL (ref 250–827)

## 2016-10-08 LAB — MAGNESIUM: Magnesium: 1.9 mg/dL (ref 1.5–2.5)

## 2016-10-08 LAB — VITAMIN D 25 HYDROXY (VIT D DEFICIENCY, FRACTURES): Vit D, 25-Hydroxy: 81 ng/mL (ref 30–100)

## 2016-10-08 NOTE — Progress Notes (Signed)
Pt aware of lab results & voiced understanding of those results.

## 2016-10-27 ENCOUNTER — Other Ambulatory Visit: Payer: Self-pay | Admitting: Internal Medicine

## 2016-12-09 ENCOUNTER — Ambulatory Visit: Payer: Self-pay | Admitting: Internal Medicine

## 2017-01-02 ENCOUNTER — Other Ambulatory Visit: Payer: Self-pay | Admitting: Internal Medicine

## 2017-01-08 ENCOUNTER — Encounter: Payer: Self-pay | Admitting: Internal Medicine

## 2017-01-08 ENCOUNTER — Ambulatory Visit (INDEPENDENT_AMBULATORY_CARE_PROVIDER_SITE_OTHER): Payer: Medicare HMO | Admitting: Internal Medicine

## 2017-01-08 VITALS — BP 116/74 | HR 92 | Temp 97.3°F | Resp 16 | Ht 68.75 in | Wt 281.0 lb

## 2017-01-08 DIAGNOSIS — I10 Essential (primary) hypertension: Secondary | ICD-10-CM

## 2017-01-08 DIAGNOSIS — Z79899 Other long term (current) drug therapy: Secondary | ICD-10-CM

## 2017-01-08 DIAGNOSIS — E1122 Type 2 diabetes mellitus with diabetic chronic kidney disease: Secondary | ICD-10-CM

## 2017-01-08 DIAGNOSIS — E559 Vitamin D deficiency, unspecified: Secondary | ICD-10-CM | POA: Diagnosis not present

## 2017-01-08 DIAGNOSIS — E782 Mixed hyperlipidemia: Secondary | ICD-10-CM

## 2017-01-08 DIAGNOSIS — N182 Chronic kidney disease, stage 2 (mild): Secondary | ICD-10-CM

## 2017-01-08 LAB — CBC WITH DIFFERENTIAL/PLATELET
Basophils Absolute: 58 cells/uL (ref 0–200)
Basophils Relative: 1 %
Eosinophils Absolute: 174 cells/uL (ref 15–500)
Eosinophils Relative: 3 %
HCT: 44.7 % (ref 38.5–50.0)
Hemoglobin: 14.6 g/dL (ref 13.2–17.1)
Lymphocytes Relative: 47 %
Lymphs Abs: 2726 cells/uL (ref 850–3900)
MCH: 26.9 pg — ABNORMAL LOW (ref 27.0–33.0)
MCHC: 32.7 g/dL (ref 32.0–36.0)
MCV: 82.3 fL (ref 80.0–100.0)
MPV: 9.1 fL (ref 7.5–12.5)
Monocytes Absolute: 696 cells/uL (ref 200–950)
Monocytes Relative: 12 %
Neutro Abs: 2146 cells/uL (ref 1500–7800)
Neutrophils Relative %: 37 %
Platelets: 310 10*3/uL (ref 140–400)
RBC: 5.43 MIL/uL (ref 4.20–5.80)
RDW: 14.3 % (ref 11.0–15.0)
WBC: 5.8 10*3/uL (ref 3.8–10.8)

## 2017-01-08 LAB — BASIC METABOLIC PANEL WITH GFR
BUN: 23 mg/dL (ref 7–25)
CO2: 25 mmol/L (ref 20–31)
Calcium: 9.8 mg/dL (ref 8.6–10.3)
Chloride: 100 mmol/L (ref 98–110)
Creat: 1.33 mg/dL — ABNORMAL HIGH (ref 0.70–1.25)
GFR, Est African American: 66 mL/min (ref >=60)
GFR, Est Non African American: 57 mL/min — ABNORMAL LOW (ref >=60)
Glucose, Bld: 91 mg/dL (ref 65–99)
Potassium: 4.2 mmol/L (ref 3.5–5.3)
Sodium: 137 mmol/L (ref 135–146)

## 2017-01-08 LAB — LIPID PANEL
Cholesterol: 175 mg/dL (ref <200)
HDL: 53 mg/dL (ref >40)
LDL Cholesterol: 113 mg/dL — ABNORMAL HIGH (ref <100)
Total CHOL/HDL Ratio: 3.3 Ratio (ref <5.0)
Triglycerides: 47 mg/dL (ref <150)
VLDL: 9 mg/dL (ref <30)

## 2017-01-08 LAB — HEPATIC FUNCTION PANEL
ALT: 10 U/L (ref 9–46)
AST: 28 U/L (ref 10–35)
Albumin: 4 g/dL (ref 3.6–5.1)
Alkaline Phosphatase: 69 U/L (ref 40–115)
Bilirubin, Direct: 0.2 mg/dL (ref <=0.2)
Indirect Bilirubin: 0.6 mg/dL (ref 0.2–1.2)
Total Bilirubin: 0.8 mg/dL (ref 0.2–1.2)
Total Protein: 7.6 g/dL (ref 6.1–8.1)

## 2017-01-08 LAB — TSH: TSH: 1.59 mIU/L (ref 0.40–4.50)

## 2017-01-08 NOTE — Progress Notes (Signed)
This very nice 61 y.o. Single BM presents for 6 month follow up with Hypertension, Hyperlipidemia,  T2_NIDDM w/CKD 2  and Vitamin D Deficiency. Patient has OSA & is on CPAP with improved restorative sleep.     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     Patient is treated for HTN (2010) & BP has been controlled at home. Today's BP is at goal - 116/74. Patient has had no complaints of any cardiac type chest pain, palpitations, dyspnea/orthopnea/PND, dizziness, claudication or dependent edema.     Hyperlipidemia is controlled with diet & meds. Patient denies myalgias or other med SE's. Last Lipids were  Lab Results  Component Value Date   CHOL 175 10/07/2016   HDL 50 10/07/2016   LDLCALC 104 (H) 10/07/2016   TRIG 103 10/07/2016   CHOLHDL 3.5 10/07/2016      Also, the patient has history of T2_NIDDM and has had no symptoms of reactive hypoglycemia, diabetic polys, paresthesias or visual blurring.  In 2014 he was hospitalized with Hemiballismus from non-ketotic hyperosmolar hyperglycemia (Glu>1000 mg%). Last A1c was  Lab Results  Component Value Date   HGBA1C 6.8 (H) 10/07/2016      Further, the patient also has history of Vitamin D Deficiency and supplements vitamin D without any suspected side-effects. Last vitamin D was   Lab Results  Component Value Date   VD25OH 1081 10/07/2016   Current Outpatient Prescriptions on File Prior to Visit  Medication Sig  . CARTIA XT 120 MG  TAKE 1 CAP EVERY DAY   . VITAMIN D 5000 UNITS TABS Take 1 tablet by mouth daily.  Marland Kitchen. FARXIGA 10 MG TABS tablet Take 10 mg by mouth daily.  Marland Kitchen. losartan  100 MG tablet TAKE 1 TAB EVERY DAY FOR BLOOD PRESSURE  . Magnesium-Zinc Take 400 mg by mouth daily. Takes 1200 mg daily.  . metFORMIN-XR 500 MG  TAKE 2 TAB TWICE DAILY  FOR  DIABETES  . Multiple Vitamins-Minerals Take 1 tablet by mouth daily.  . Omega-3 FISH OIL 500 MG   Take by mouth daily.  . pravastatin  40 MG tablet TAKE 1 TABLET EVERY DAY  . sildenafil  20 MG tablet Take 1 to 5 tablets daily as needed for XXXX  . gabapentin  100 MG capsule Take 1 tablet 3 x/day for neuropathy pains   . phentermine  37.5 MG tablet Take 1/2 to 1 tablet every morning for dieting    No Known Allergies  PMHx:   Past Medical History:  Diagnosis Date  . Vitamin D deficiency    Immunization History  Administered Date(s) Administered  . Pneumococcal 02/16/2013  . Tdap 02/10/2011   Past Surgical History:  Procedure Laterality Date  . CARPAL TUNNEL RELEASE  2008   bilateral  . CERVICAL DISCECTOMY  2003  . KNEE ARTHROSCOPY  2010   left   FHx:    Reviewed / unchanged  SHx:    Reviewed / unchanged  Systems Review:  Constitutional: Denies fever, chills, wt changes, headaches, insomnia, fatigue, night sweats, change in appetite. Eyes: Denies redness, blurred vision, diplopia, discharge, itchy, watery eyes.  ENT: Denies discharge, congestion, post nasal drip, epistaxis, sore throat, earache, hearing loss, dental pain, tinnitus, vertigo, sinus pain, snoring.  CV: Denies chest pain, palpitations, irregular heartbeat, syncope, dyspnea, diaphoresis, orthopnea, PND, claudication or edema. Respiratory: denies cough, dyspnea, DOE, pleurisy, hoarseness,  laryngitis, wheezing.  Gastrointestinal: Denies dysphagia, odynophagia, heartburn, reflux, water brash, abdominal pain or cramps, nausea, vomiting, bloating, diarrhea, constipation, hematemesis, melena, hematochezia  or hemorrhoids. Genitourinary: Denies dysuria, frequency, urgency, nocturia, hesitancy, discharge, hematuria or flank pain. Musculoskeletal: Denies arthralgias, myalgias, stiffness, jt. swelling, pain, limping or strain/sprain.  Skin: Denies pruritus, rash, hives, warts, acne, eczema or change in skin lesion(s). Neuro: No weakness, tremor, incoordination, spasms, paresthesia or pain. Psychiatric: Denies confusion,  memory loss or sensory loss. Endo: Denies change in weight, skin or hair change.  Heme/Lymph: No excessive bleeding, bruising or enlarged lymph nodes.  Physical Exam  BP 116/74   Pulse 92   Temp 97.3 F (36.3 C)   Resp 16   Ht 5' 8.75" (1.746 m)   Wt 281 lb (127.5 kg)   BMI 41.80 kg/m   Appears well nourished, well groomed  and in no distress.  Eyes: PERRLA, EOMs, conjunctiva no swelling or erythema. Sinuses: No frontal/maxillary tenderness ENT/Mouth: EAC's clear, TM's nl w/o erythema, bulging. Nares clear w/o erythema, swelling, exudates. Oropharynx clear without erythema or exudates. Oral hygiene is good. Tongue normal, non obstructing. Hearing intact.  Neck: Supple. Thyroid nl. Car 2+/2+ without bruits, nodes or JVD. Chest: Respirations nl with BS clear & equal w/o rales, rhonchi, wheezing or stridor.  Cor: Heart sounds normal w/ regular rate and rhythm without sig. murmurs, gallops, clicks or rubs. Peripheral pulses normal and equal  without edema.  Abdomen: Soft & bowel sounds normal. Non-tender w/o guarding, rebound, hernias, masses or organomegaly.  Lymphatics: Unremarkable.  Musculoskeletal: Full ROM all peripheral extremities, joint stability, 5/5 strength and normal gait.  Skin: Warm, dry without exposed rashes, lesions or ecchymosis apparent.  Neuro: Cranial nerves intact, reflexes equal bilaterally. Sensory-motor testing grossly intact. Tendon reflexes grossly intact.  Pysch: Alert & oriented x 3.  Insight and judgement nl & appropriate. No ideations.  Assessment and Plan:  1. Essential hypertension  - Continue medication, monitor blood pressure at home.  - Continue DASH diet. Reminder to go to the ER if any CP,  SOB, nausea, dizziness, severe HA, changes vision/speech,  left arm numbness and tingling and jaw pain.  - CBC with Differential/Platelet - BASIC METABOLIC PANEL WITH GFR - Magnesium - TSH  2. Hyperlipidemia, mixed  - Continue diet/meds, exercise,&  lifestyle modifications.  - Continue monitor periodic cholesterol/liver & renal functions   - Hepatic function panel - Lipid panel - TSH  3. T2_NIDDM w/CKD2  (HCC)  - Continue diet, exercise, lifestyle modifications.  - Monitor appropriate labs.  - Hemoglobin A1c - Insulin, random  4. Vitamin D deficiency  - Continue supplementation.  - VITAMIN D 25 Hydroxy   5. Medication management  - CBC with Differential/Platelet - BASIC METABOLIC PANEL WITH GFR - Hepatic function panel - Magnesium - Lipid panel - TSH - Hemoglobin A1c - Insulin, random - VITAMIN D 25 Hydroxy        Discussed  regular exercise, BP monitoring, weight control to achieve/maintain BMI less than 25 and discussed med and SE's. Recommended labs to assess and monitor clinical status with further disposition pending results of labs. Over 30 minutes of exam, counseling, chart review was performed.

## 2017-01-08 NOTE — Patient Instructions (Signed)

## 2017-01-09 ENCOUNTER — Other Ambulatory Visit: Payer: Self-pay | Admitting: Internal Medicine

## 2017-01-09 ENCOUNTER — Other Ambulatory Visit: Payer: Self-pay | Admitting: *Deleted

## 2017-01-09 LAB — VITAMIN D 25 HYDROXY (VIT D DEFICIENCY, FRACTURES): Vit D, 25-Hydroxy: 87 ng/mL (ref 30–100)

## 2017-01-09 LAB — INSULIN, RANDOM: Insulin: 14 u[IU]/mL (ref 2.0–19.6)

## 2017-01-09 LAB — HEMOGLOBIN A1C
Hgb A1c MFr Bld: 6.3 % — ABNORMAL HIGH (ref <5.7)
Mean Plasma Glucose: 134 mg/dL

## 2017-01-09 LAB — MAGNESIUM: Magnesium: 1.9 mg/dL (ref 1.5–2.5)

## 2017-01-09 MED ORDER — ROSUVASTATIN CALCIUM 40 MG PO TABS: ORAL_TABLET | ORAL | 5 refills | Status: DC

## 2017-01-09 MED ORDER — GABAPENTIN 100 MG PO CAPS: ORAL_CAPSULE | ORAL | 1 refills | Status: DC

## 2017-01-13 ENCOUNTER — Encounter: Payer: Self-pay | Admitting: *Deleted

## 2017-01-13 ENCOUNTER — Other Ambulatory Visit: Payer: Self-pay | Admitting: *Deleted

## 2017-01-13 MED ORDER — GABAPENTIN 100 MG PO CAPS: ORAL_CAPSULE | ORAL | 1 refills | Status: DC

## 2017-01-14 ENCOUNTER — Other Ambulatory Visit: Payer: Self-pay | Admitting: *Deleted

## 2017-01-14 MED ORDER — ROSUVASTATIN CALCIUM 40 MG PO TABS: ORAL_TABLET | ORAL | 1 refills | Status: DC

## 2017-01-19 ENCOUNTER — Telehealth: Payer: Self-pay | Admitting: *Deleted

## 2017-01-19 ENCOUNTER — Other Ambulatory Visit: Payer: Self-pay | Admitting: *Deleted

## 2017-01-19 MED ORDER — GABAPENTIN 100 MG PO CAPS: ORAL_CAPSULE | ORAL | 1 refills | Status: DC

## 2017-01-19 NOTE — Telephone Encounter (Signed)
Pt received a call concerning 01/20/2017 appt, and wanted to know if it was for him or his ftr.

## 2017-02-12 ENCOUNTER — Other Ambulatory Visit: Payer: Self-pay | Admitting: Internal Medicine

## 2017-03-04 ENCOUNTER — Encounter (HOSPITAL_COMMUNITY): Payer: Self-pay | Admitting: Emergency Medicine

## 2017-03-04 ENCOUNTER — Emergency Department (HOSPITAL_COMMUNITY)
Admission: EM | Admit: 2017-03-04 | Discharge: 2017-03-04 | Disposition: A | Discharge status: Home / Self Care | Payer: Medicare HMO | Attending: Emergency Medicine | Admitting: Emergency Medicine

## 2017-03-04 DIAGNOSIS — Y999 Unspecified external cause status: Secondary | ICD-10-CM | POA: Insufficient documentation

## 2017-03-04 DIAGNOSIS — S46911A Strain of unspecified muscle, fascia and tendon at shoulder and upper arm level, right arm, initial encounter: Secondary | ICD-10-CM | POA: Insufficient documentation

## 2017-03-04 DIAGNOSIS — Y939 Activity, unspecified: Secondary | ICD-10-CM | POA: Diagnosis not present

## 2017-03-04 DIAGNOSIS — Z7984 Long term (current) use of oral hypoglycemic drugs: Secondary | ICD-10-CM | POA: Insufficient documentation

## 2017-03-04 DIAGNOSIS — S39012A Strain of muscle, fascia and tendon of lower back, initial encounter: Secondary | ICD-10-CM | POA: Diagnosis not present

## 2017-03-04 DIAGNOSIS — X500XXA Overexertion from strenuous movement or load, initial encounter: Secondary | ICD-10-CM | POA: Insufficient documentation

## 2017-03-04 DIAGNOSIS — Z79899 Other long term (current) drug therapy: Secondary | ICD-10-CM | POA: Diagnosis not present

## 2017-03-04 DIAGNOSIS — I10 Essential (primary) hypertension: Secondary | ICD-10-CM | POA: Insufficient documentation

## 2017-03-04 DIAGNOSIS — F17200 Nicotine dependence, unspecified, uncomplicated: Secondary | ICD-10-CM | POA: Insufficient documentation

## 2017-03-04 DIAGNOSIS — Y929 Unspecified place or not applicable: Secondary | ICD-10-CM | POA: Diagnosis not present

## 2017-03-04 DIAGNOSIS — S3992XA Unspecified injury of lower back, initial encounter: Secondary | ICD-10-CM | POA: Diagnosis present

## 2017-03-04 MED ORDER — MELOXICAM 7.5 MG PO TABS: 7.5000 mg | ORAL_TABLET | Freq: Every day | ORAL | 0 refills | Status: DC

## 2017-03-04 MED ORDER — CYCLOBENZAPRINE HCL 10 MG PO TABS: 10.0000 mg | ORAL_TABLET | Freq: Two times a day (BID) | ORAL | 0 refills | Status: DC | PRN

## 2017-03-04 NOTE — Discharge Instructions (Signed)
Ice your shoulder several times a day. Try heating pads on the back. Mobic for pain and inflammation. Flexeril for spasms. Follow up with your doctor next week if not improving.

## 2017-03-04 NOTE — ED Provider Notes (Signed)
[facility]-EMERGENCY DEPT Provider Note   CSN: [id number] Arrival date & time: 03/04/17  4098  By signing my name below, I, Cynda Acres, attest that this documentation has been prepared under the direction and in the presence of Yanuel Tagg, PA-C. Electronically Signed: Cynda Acres, Scribe. 03/04/17. 10:18 AM.  History   Chief Complaint Chief Complaint  Patient presents with  . Shoulder Pain  . Ribcage Pain    HPI Comments: [name] [name] is a 61 y.o. male with a history of chronic pain syndrome and hypertension, who presents to the Emergency Department complaining of gradual-onset, constant right shoulder pain that began three days ago. Patient states he was lifting and moving logs, upon waking up the next day he began having right shoulder and left lower back pain. No additional symptoms noted. Patient stated he has been taking prescribed gabapentin with no relief. No modifying factors indicated. Patient is ambulatory in the emergency department. Patient denies any urinary retention, loss of bowel/bladder control, numbness, weakness, tingling, fever, or chills.   The history is provided by the patient. No language interpreter was used.    Past Medical History:  Diagnosis Date  . Vitamin D deficiency     Patient Active Problem List   Diagnosis Date Noted  . Aneurysm artery, iliac common (HCC) 01/23/2015  . Chronic pain syndrome 01/23/2015  . Peripheral sensory neuropathy due to T2_NIDDM 01/23/2015  . Erectile dysfunction 01/23/2015  . T2_NIDDM w/ Stage 2 CKD (GFR 77  ml/min) 06/06/2014  . Medication management 06/06/2014  . Vitamin D deficiency   . HTN (hypertension) 09/29/2012  . Mixed hyperlipidemia 09/29/2012  . GERD (gastroesophageal reflux disease) 09/29/2012  . Morbid obesity (BMI 45+) 09/29/2012    Past Surgical History:  Procedure Laterality Date  . CARPAL TUNNEL RELEASE  2008   bilateral  . CERVICAL DISCECTOMY  2003  . KNEE ARTHROSCOPY  2010   left        Home Medications    Prior to Admission medications   Medication Sig Start Date End Date Taking? Authorizing Provider  CARTIA XT 120 MG 24 hr capsule TAKE 1 CAPSULE EVERY DAY FOR BLOOD PRESSURE 09/29/16   Lucky Cowboy, MD  Cholecalciferol (VITAMIN D-3) 5000 UNITS TABS Take 1 tablet by mouth daily.    [provider]  dapagliflozin propanediol (FARXIGA) 10 MG TABS tablet Take 10 mg by mouth daily. 07/04/16   Lucky Cowboy, MD  gabapentin (NEURONTIN) 100 MG capsule Take 1 tablet 3 x/day for neuropathy pains in hands 01/19/17 02/18/17  Lucky Cowboy, MD  glucose blood (ACCU-CHEK SMARTVIEW) test strip Use as instructed 11/07/14   Lucky Cowboy, MD  losartan (COZAAR) 100 MG tablet TAKE 1 TABLET EVERY DAY FOR BLOOD PRESSURE 01/03/17   Lucky Cowboy, MD  Magnesium-Zinc (MAGNESIUM-CHELATED ZINC PO) Take 400 mg by mouth daily. Takes 1200 mg daily.    [provider]  metFORMIN (GLUCOPHAGE-XR) 500 MG 24 hr tablet TAKE 2 TABLETS TWICE DAILY  FOR  DIABETES 01/09/17   Lucky Cowboy, MD  Multiple Vitamins-Minerals [facility] PREMIER 50 PLUS PO) Take 1 tablet by mouth daily.    [provider]  Omega-3 Fatty Acids (FISH OIL) 500 MG CAPS Take by mouth daily.    [provider]  phentermine (ADIPEX-P) 37.5 MG tablet TAKE 1/2 - 1 TABLET BY MOUTH EVERY MORNING 02/12/17   Lucky Cowboy, MD  rosuvastatin (CRESTOR) 40 MG tablet Take 1/2 to 1 tablet daily or as directed for Cholesterol 01/14/17 07/17/17  Lucky Cowboy, MD  sildenafil (REVATIO) 20 MG tablet Take 1 to 5 tablets daily as needed for XXXX 06/02/16 06/02/17  Lucky CowboyMcKeown, William, MD    Family History Family History  Problem Relation Age of Onset  . Diabetes Father   . Hypertension Father   . Diabetes Sister   . Kidney disease Sister   . Heart disease Mother   . Stroke Mother   . Diabetes Mother   . Colon cancer Neg Hx   . Stomach cancer Neg Hx     Social History Social History   Substance Use Topics  . Smoking status: Former Smoker    Quit date: 02/25/1991  . Smokeless tobacco: Never Used  . Alcohol use No     Allergies   Patient has no known allergies.   Review of Systems Review of Systems  Constitutional: Negative for chills and fever.  Respiratory: Negative for shortness of breath.   Genitourinary: Negative for difficulty urinating and hematuria.  Musculoskeletal: Positive for arthralgias (right shoulder) and back pain. Negative for gait problem and joint swelling.  Neurological: Negative for weakness and numbness.  All other systems reviewed and are negative.    Physical Exam Updated Vital Signs BP 140/88 (BP Location: Left Arm)   Pulse 86   Temp 98.2 F (36.8 C) (Oral)   Resp 16   SpO2 94%   Physical Exam  Constitutional: He is oriented to person, place, and time. He appears well-developed.  HENT:  Head: Normocephalic and atraumatic.  Mouth/Throat: Oropharynx is clear and moist.  Eyes: Pupils are equal, round, and reactive to light. Conjunctivae and EOM are normal.  Neck: Normal range of motion. Neck supple.  Cardiovascular: Normal rate, regular rhythm and normal heart sounds.   Pulmonary/Chest: Effort normal and breath sounds normal. No respiratory distress.  Musculoskeletal: Normal range of motion. He exhibits tenderness. He exhibits no edema or deformity.  Diffuse tenderness to palpation of the right shoulder. Full range of motion actively and passively. Pain with full flexion, internal and external rotation. Negative arm drop test. Tenderness to palpation over left lumbar paraspinal muscles. No midline tenderness. No pain with straight leg raise.   Neurological: He is alert and oriented to person, place, and time.  5/5 and equal lower extremity strength. 2+ and equal patellar reflexes bilaterally. Pt able to dorsiflex bilateral toes and feet with good strength against resistance. Equal sensation bilaterally over thighs and lower legs.    Skin: Skin is warm and dry.  Nursing note and vitals reviewed.    ED Treatments / Results  DIAGNOSTIC STUDIES: Oxygen Saturation is 94% on RA, normal by my interpretation.    COORDINATION OF CARE: 10:16 AM Discussed treatment plan with pt at bedside and pt agreed to plan, which includes a muscle relaxant.   Labs (all labs ordered are listed, but only abnormal results are displayed) Labs Reviewed - No data to display  EKG  EKG Interpretation None       Radiology No results found.  Procedures Procedures (including critical care time)  Medications Ordered in ED Medications - No data to display   Initial Impression / Assessment and Plan / ED Course  I have reviewed the triage vital signs and the nursing notes.  Pertinent labs & imaging results that were available during my care of the patient were reviewed by me and considered in my medical decision making (see chart for details).     Patient in emergency department with pain to the right shoulder and left lower back after lifting  some walks. Pain onset on the next day. In no acute sharp pain during lifting. Most likely muscular soreness, versus possible strain. No neurological complaints or findings on the exam. Will add an NSAID for short course and flexeril. Home with pcp follow up next week if not improving.   Vitals:   03/04/17 0935  BP: 140/88  Pulse: 86  Resp: 16  Temp: 98.2 F (36.8 C)  TempSrc: Oral  SpO2: 94%    Final Clinical Impressions(s) / ED Diagnoses   Final diagnoses:  Lumbar strain, initial encounter  Shoulder strain, right, initial encounter    New Prescriptions New Prescriptions   CYCLOBENZAPRINE (FLEXERIL) 10 MG TABLET    Take 1 tablet (10 mg total) by mouth 2 (two) times daily as needed for muscle spasms.   MELOXICAM (MOBIC) 7.5 MG TABLET    Take 1 tablet (7.5 mg total) by mouth daily.   I personally performed the services described in this documentation, which was scribed in my  presence. The recorded information has been reviewed and is accurate.     Jaynie Crumble, PA-C 03/04/17 1031    Derwood Kaplan, MD 03/05/17 [phone]

## 2017-03-04 NOTE — ED Triage Notes (Signed)
Pt complaint of right shoulder and left ribcage pain post moving trees two days ago.

## 2017-03-04 NOTE — ED Notes (Signed)
Bed: WTR6 Expected date:  Expected time:  Means of arrival:  Comments: 

## 2017-03-10 ENCOUNTER — Other Ambulatory Visit: Payer: Self-pay | Admitting: Internal Medicine

## 2017-03-12 ENCOUNTER — Other Ambulatory Visit: Payer: Self-pay | Admitting: Internal Medicine

## 2017-03-12 DIAGNOSIS — I10 Essential (primary) hypertension: Secondary | ICD-10-CM

## 2017-03-12 MED ORDER — LOSARTAN POTASSIUM 100 MG PO TABS: ORAL_TABLET | ORAL | 1 refills | Status: DC

## 2017-03-23 ENCOUNTER — Other Ambulatory Visit: Payer: Self-pay

## 2017-03-23 MED ORDER — DAPAGLIFLOZIN PROPANEDIOL 10 MG PO TABS: 10.0000 mg | ORAL_TABLET | Freq: Every day | ORAL | 3 refills | Status: DC

## 2017-04-03 DIAGNOSIS — E119 Type 2 diabetes mellitus without complications: Secondary | ICD-10-CM | POA: Diagnosis not present

## 2017-04-03 LAB — HM DIABETES EYE EXAM

## 2017-04-15 NOTE — Progress Notes (Signed)
MEDICARE ANNUAL WELLNESS VISIT AND FOLLOW UP Assessment:    Essential hypertension - continue medications, DASH diet, exercise and monitor at home. Call if greater than 130/80.  -     CBC with Differential/Platelet -     BASIC METABOLIC PANEL WITH GFR -     Hepatic function panel -     TSH  Aneurysm artery, iliac common (HCC) Control blood pressure, cholesterol, glucose, increase exercise.   Type 2 diabetes mellitus with stage 2 chronic kidney disease, without long-term current use of insulin (HCC) Discussed general issues about diabetes pathophysiology and management., Educational material distributed., Suggested low cholesterol diet., Encouraged aerobic exercise., Discussed foot care., Reminded to get yearly retinal exam. -     BASIC METABOLIC PANEL WITH GFR -     Hemoglobin A1c  Peripheral sensory neuropathy due to T2_NIDDM Discussed general issues about diabetes pathophysiology and management., Educational material distributed., Suggested low cholesterol diet., Encouraged aerobic exercise., Discussed foot care., Reminded to get yearly retinal exam. -     Hemoglobin A1c  Mixed hyperlipidemia -continue medications, check lipids, decrease fatty foods, increase activity.  -     Lipid panel  Morbid obesity (BMI 45+) Continue weight loss, discussed diet/exercise  Medication management -     Magnesium  Vitamin D deficiency Continue supplement  Chronic pain syndrome Increase gabapentin  Erectile dysfunction associated with type 2 diabetes mellitus (HCC) Discussed general issues about diabetes pathophysiology and management., Educational material distributed., Suggested low cholesterol diet., Encouraged aerobic exercise., Discussed foot care., Reminded to get yearly retinal exam.  Gastroesophageal reflux disease, esophagitis presence not specified Continue PPI/H2 blocker, diet discussed  Advanced care planning/counseling discussion Discussed with patient, took sheets and will  bring in papers when completed to scan in   Encounter for Medicare annual wellness exam 1 year  Myalgia -     Iron,Total/Total Iron Binding Cap -     CK  Other orders -     Discontinue: aspirin EC 81 MG tablet; Take 1 tablet (81 mg total) by mouth daily. -     aspirin EC 81 MG tablet; Take 1 tablet (81 mg total) by mouth daily.    Over 30 minutes of exam, counseling, chart review, and critical decision making was performed  Future Appointments Date Time Provider Department Center  07/17/2017 11:15 AM Lucky Cowboy, MD [facility] None     Plan:   During the course of the visit the patient was educated and counseled about appropriate screening and preventive services including:    Pneumococcal vaccine   Influenza vaccine  Prevnar 13  Td vaccine  Screening electrocardiogram  Colorectal cancer screening  Diabetes screening  Glaucoma screening  Nutrition counseling    Subjective:  [name] [name] is a 61 y.o. AAM who presents for Medicare Annual Wellness Visit and 3 month follow up for HTN, hyperlipidemia, diabetes, and vitamin D Def.   His blood pressure has been controlled at home, today their BP is BP: 130/74 He does workout, cutting grass, riding bike.  He denies chest pain, shortness of breath, dizziness.   He is taking care of his father, moved him in with him. He has retired from [employer] and is on disability for his bilateral hand numbness.  He is on cholesterol medication and denies myalgias. His cholesterol is at goal. The cholesterol last visit was:   Lab Results  Component Value Date   CHOL 175 01/08/2017   HDL 53 01/08/2017   LDLCALC 113 (H) 01/08/2017   TRIG 47  01/08/2017   CHOLHDL 3.3 01/08/2017   He has been working on diet and exercise for Diabetes with diabetic chronic kidney disease and with diabetic polyneuropathy, he is on gabapentin which is helping, he is on bASA but has ran out, he is on ACE/ARB, and denies polydipsia, polyuria and  visual disturbances. Last A1C was:  Lab Results  Component Value Date   HGBA1C 6.3 (H) 01/08/2017   Last GFR Lab Results  Component Value Date   GFRAA 66 01/08/2017   Patient is on Vitamin D supplement.   Lab Results  Component Value Date   VD25OH 87 01/08/2017     BMI is Body mass index is 41.38 kg/m., he is working on diet and exercise. Wt Readings from Last 3 Encounters:  04/16/17 278 lb 3.2 oz (126.2 kg)  01/08/17 281 lb (127.5 kg)  10/07/16 294 lb 12.8 oz (133.7 kg)     Medication Review: Current Outpatient Prescriptions on File Prior to Visit  Medication Sig Dispense Refill  . CARTIA XT 120 MG 24 hr capsule TAKE 1 CAPSULE EVERY DAY FOR BLOOD PRESSURE 90 capsule 3  . Cholecalciferol (VITAMIN D-3) 5000 UNITS TABS Take 1 tablet by mouth daily.    . cyclobenzaprine (FLEXERIL) 10 MG tablet Take 1 tablet (10 mg total) by mouth 2 (two) times daily as needed for muscle spasms. 20 tablet 0  . dapagliflozin propanediol (FARXIGA) 10 MG TABS tablet Take 10 mg by mouth daily. 90 tablet 3  . glucose blood (ACCU-CHEK SMARTVIEW) test strip Use as instructed 100 each 12  . losartan (COZAAR) 100 MG tablet Take 1 tablet daily for BP 90 tablet 1  . Magnesium-Zinc (MAGNESIUM-CHELATED ZINC PO) Take 400 mg by mouth daily. Takes 1200 mg daily.    . meloxicam (MOBIC) 7.5 MG tablet Take 1 tablet (7.5 mg total) by mouth daily. 20 tablet 0  . metFORMIN (GLUCOPHAGE-XR) 500 MG 24 hr tablet TAKE 2 TABLETS TWICE DAILY  FOR  DIABETES 360 tablet 1  . Multiple Vitamins-Minerals (THERAGRAN-M PREMIER 50 PLUS PO) Take 1 tablet by mouth daily.    . Omega-3 Fatty Acids (FISH OIL) 500 MG CAPS Take by mouth daily.    . phentermine (ADIPEX-P) 37.5 MG tablet TAKE 1/2 - 1 TABLET BY MOUTH EVERY MORNING 30 tablet 5  . rosuvastatin (CRESTOR) 40 MG tablet Take 1/2 to 1 tablet daily or as directed for Cholesterol 90 tablet 1  . sildenafil (REVATIO) 20 MG tablet Take 1 to 5 tablets daily as needed for XXXX 90 tablet 99   . gabapentin (NEURONTIN) 100 MG capsule Take 1 tablet 3 x/day for neuropathy pains in hands 270 capsule 1   No current facility-administered medications on file prior to visit.     Allergies: No Known Allergies  Current Problems (verified) has HTN (hypertension); Mixed hyperlipidemia; GERD (gastroesophageal reflux disease); Morbid obesity (BMI 45+); Vitamin D deficiency; T2_NIDDM w/ Stage 2 CKD (GFR 77  ml/min); Medication management; Aneurysm artery, iliac common (HCC); Chronic pain syndrome; Peripheral sensory neuropathy due to T2_NIDDM; Erectile dysfunction associated with type 2 diabetes mellitus (HCC); and Chronic lower back pain on his problem list.  Screening Tests Immunization History  Administered Date(s) Administered  . Pneumococcal-Unspecified 02/16/2013  . Tdap 02/10/2011   Preventative care: Last colonoscopy: 2013 Ct head 11/2014 US aorta 2015  Prior vaccinations: TD or Tdap: 2012  Influenza: Declined  Pneumococcal: 2014 Prevnar13: Declined until 65 Shingles/Zostavax: Declined  Names of Other Physician/Practitioners you currently use: 1. [facility] here for primary care 2. Dr. Elmer PickerHecker, eye doctor, last visit 2018 3. Does not have a denstist  Patient Care Team: Lucky CowboyMcKeown, William, MD as PCP - General (Internal Medicine) Mardella LaymanPatterson, David R, MD as Consulting Physician (Gastroenterology) Mateo FlowHecker, Kathryn, MD as Consulting Physician (Ophthalmology) Salvatore MarvelWainer, Robert, MD as Consulting Physician (Orthopedic Surgery) Sypher, Molly Maduroobert, MD (Inactive) as Consulting Physician (Orthopedic Surgery) Hilda LiasBotero, Ernesto, MD as Consulting Physician (Neurosurgery) Carrington Clampuchman, Richard C, DPM as Consulting Physician (Podiatry)  Surgical: He  has a past surgical history that includes Carpal tunnel release (2008); Knee arthroscopy (2010); and Cervical discectomy (2003). Family His family history includes Diabetes in his father, mother, and sister; Heart  disease in his mother; Hypertension in his father; Kidney disease in his sister; Stroke in his mother. Social history  He reports that he quit smoking about 26 years ago. He has never used smokeless tobacco. He reports that he does not drink alcohol or use drugs.  MEDICARE WELLNESS OBJECTIVES: Physical activity: Current Exercise Habits: Home exercise routine, Type of exercise: walking, Time (Minutes): 20, Frequency (Times/Week): 3, Weekly Exercise (Minutes/Week): 60, Intensity: Mild Cardiac risk factors: Cardiac Risk Factors include: advanced age (>3155men, 61>65 women);diabetes mellitus;dyslipidemia;hypertension;obesity (BMI >30kg/m2);sedentary lifestyle;male gender Depression/mood screen:   [facility] 04/16/2017  Decreased Interest 0  Down, Depressed, Hopeless 0  PHQ - 2 Score 0    ADLs:  In your present state of health, do you have any difficulty performing the following activities: 04/16/2017 01/08/2017  Hearing? N N  Vision? N N  Difficulty concentrating or making decisions? N N  Walking or climbing stairs? N N  Dressing or bathing? N N  Doing errands, shopping? N N  Preparing Food and eating ? N -  Using the Toilet? N -  In the past six months, have you accidently leaked urine? N -  Do you have problems with loss of bowel control? N -  Managing your Medications? N -  Managing your Finances? N -  Housekeeping or managing your Housekeeping? N -  Some recent data might be hidden     Cognitive Testing  Alert? Yes  Normal Appearance?Yes  Oriented to person? Yes  Place? Yes   Time? Yes  Recall of three objects?  Yes  Can perform simple calculations? Yes  Displays appropriate judgment?Yes  Can read the correct time from a watch face?Yes  EOL planning: Does Patient Have a Medical Advance Directive?: No Would patient like information on creating a medical advance directive?: Yes (MAU/Ambulatory/Procedural Areas - Information given)   Objective:   Today's Vitals    04/16/17 1058  BP: 130/74  Pulse: 95  Resp: 16  Temp: 97.7 F (36.5 C)  SpO2: 95%  Weight: 278 lb 3.2 oz (126.2 kg)  Height: 5' 8.75" (1.746 m)  PainSc: 8   PainLoc: Shoulder   Body mass index is 41.38 kg/m.  eneral Appearance: Well nourished, in no apparent distress. Eyes: PERRLA, EOMs, conjunctiva no swelling or erythema Sinuses: No Frontal/maxillary tenderness ENT/Mouth: Ext aud canals clear, TMs without erythema, bulging. No erythema, swelling, or exudate on post pharynx.  Tonsils not swollen or erythematous. Hearing normal.  Neck: Supple, thyroid normal.  Respiratory: Respiratory effort normal, BS equal bilaterally without rales, rhonchi, wheezing or stridor.  Cardio: RRR with no MRGs. Brisk peripheral pulses without edema.  Abdomen: Soft, obese, + BS.  Non tender, no guarding, rebound, hernias, masses. Lymphatics: Non tender without lymphadenopathy.  Musculoskeletal: Full ROM, 5/5 strength, Normal gait Skin: Warm, dry without rashes, lesions,  ecchymosis.  Neuro: Cranial nerves intact. No cerebellar symptoms.  Psych: Awake and oriented X 3, normal affect, Insight and Judgment appropriate.   Medicare Attestation I have personally reviewed: The patient's medical and social history Their use of alcohol, tobacco or illicit drugs Their current medications and supplements The patient's functional ability including ADLs,fall risks, home safety risks, cognitive, and hearing and visual impairment Diet and physical activities Evidence for depression or mood disorders  The patient's weight, height, BMI, and visual acuity have been recorded in the chart.  I have made referrals, counseling, and provided education to the patient based on review of the above and I have provided the patient with a written personalized care plan for preventive services.     Quentin Mulling, PA-C   04/16/2017

## 2017-04-16 ENCOUNTER — Encounter: Payer: Self-pay | Admitting: Physician Assistant

## 2017-04-16 ENCOUNTER — Ambulatory Visit (INDEPENDENT_AMBULATORY_CARE_PROVIDER_SITE_OTHER): Payer: Medicare HMO | Admitting: Physician Assistant

## 2017-04-16 ENCOUNTER — Telehealth: Payer: Self-pay | Admitting: Physician Assistant

## 2017-04-16 VITALS — BP 130/74 | HR 95 | Temp 97.7°F | Resp 16 | Ht 68.75 in | Wt 278.2 lb

## 2017-04-16 DIAGNOSIS — Z0000 Encounter for general adult medical examination without abnormal findings: Secondary | ICD-10-CM

## 2017-04-16 DIAGNOSIS — Z7189 Other specified counseling: Secondary | ICD-10-CM | POA: Diagnosis not present

## 2017-04-16 DIAGNOSIS — K219 Gastro-esophageal reflux disease without esophagitis: Secondary | ICD-10-CM | POA: Diagnosis not present

## 2017-04-16 DIAGNOSIS — N182 Chronic kidney disease, stage 2 (mild): Secondary | ICD-10-CM | POA: Diagnosis not present

## 2017-04-16 DIAGNOSIS — M545 Low back pain, unspecified: Secondary | ICD-10-CM | POA: Insufficient documentation

## 2017-04-16 DIAGNOSIS — M791 Myalgia, unspecified site: Secondary | ICD-10-CM

## 2017-04-16 DIAGNOSIS — E559 Vitamin D deficiency, unspecified: Secondary | ICD-10-CM

## 2017-04-16 DIAGNOSIS — R6889 Other general symptoms and signs: Secondary | ICD-10-CM | POA: Diagnosis not present

## 2017-04-16 DIAGNOSIS — G894 Chronic pain syndrome: Secondary | ICD-10-CM

## 2017-04-16 DIAGNOSIS — E782 Mixed hyperlipidemia: Secondary | ICD-10-CM

## 2017-04-16 DIAGNOSIS — Z0001 Encounter for general adult medical examination with abnormal findings: Secondary | ICD-10-CM

## 2017-04-16 DIAGNOSIS — E1142 Type 2 diabetes mellitus with diabetic polyneuropathy: Secondary | ICD-10-CM

## 2017-04-16 DIAGNOSIS — Z79899 Other long term (current) drug therapy: Secondary | ICD-10-CM

## 2017-04-16 DIAGNOSIS — E1169 Type 2 diabetes mellitus with other specified complication: Secondary | ICD-10-CM | POA: Diagnosis not present

## 2017-04-16 DIAGNOSIS — I10 Essential (primary) hypertension: Secondary | ICD-10-CM | POA: Diagnosis not present

## 2017-04-16 DIAGNOSIS — E1122 Type 2 diabetes mellitus with diabetic chronic kidney disease: Secondary | ICD-10-CM

## 2017-04-16 DIAGNOSIS — G8929 Other chronic pain: Secondary | ICD-10-CM | POA: Insufficient documentation

## 2017-04-16 DIAGNOSIS — N521 Erectile dysfunction due to diseases classified elsewhere: Secondary | ICD-10-CM

## 2017-04-16 DIAGNOSIS — I723 Aneurysm of iliac artery: Secondary | ICD-10-CM | POA: Diagnosis not present

## 2017-04-16 MED ORDER — ASPIRIN EC 81 MG PO TBEC: 81.0000 mg | DELAYED_RELEASE_TABLET | Freq: Every day | ORAL | 2 refills | Status: AC

## 2017-04-16 MED ORDER — PREDNISONE 20 MG PO TABS: ORAL_TABLET | ORAL | 0 refills | Status: AC

## 2017-04-16 MED ORDER — ASPIRIN EC 81 MG PO TBEC: 81.0000 mg | DELAYED_RELEASE_TABLET | Freq: Every day | ORAL | 2 refills | Status: DC

## 2017-04-16 NOTE — Telephone Encounter (Signed)
Patient has history of back pain, calling because it went out on him again and requesting pain medications. Will refer to ortho for evaluation and send in prednisone to take.   Go to the ER if you have any new weakness in your legs, have trouble controlling your urine or bowels, or have worsening pain.

## 2017-04-16 NOTE — Patient Instructions (Addendum)
Add ENTERIC COATED low dose 81 mg Aspirin daily OR can do every other day if you have easy bruising to protect your heart and head. As well as to reduce risk of Colon Cancer by 20 %, Skin Cancer by 26 % , Melanoma by 46% and Pancreatic cancer by 60%  Can take the gabapentin for nerve pain. It can make you sleepy so we suggest trying it at night first and please plan to not drive or do anything strenuous. Also please do not take this medication with alcohol.  Start out 1 pill at night before bed, can increase to 2 pills at night before bed. Please call the office if you have any side effects.   Can take 3 pills a day however you would like  Some examples: - 1 breakfast, lunch, bedtime. - 1 at breakfast, 2 at bed time   Simple math prevails.    1st - exercise does not produce significant weight loss - at best one converts fat into muscle , "bulks up", loses inches, but usually stays "weight neutral"     2nd - think of your body weightas a check book: If you eat more calories than you burn up - you save money or gain weight .... Or if you spend more money than you put in the check book, ie burn up more calories than you eat, then you lose weight     3rd - if you walk or run 1 mile, you burn up 100 calories - you have to burn up 3,500 calories to lose 1 pound, ie you have to walk/run 35 miles to lose 1 measly pound. So if you want to lose 10 #, then you have to walk/run 350 miles, so.... clearly exercise is not the solution.     4. So if you consume 1,500 calories, then you have to burn up the equivalent of 15 miles to stay weight neutral - It also stands to reason that if you consume 1,500 cal/day and don't lose weight, then you must be burning up about 1,500 cals/day to stay weight neutral.     5. If you really want to lose weight, you must cut your calorie intake 300 calories /day and at that rate you should lose about 1 # every 3 days.   6. Please purchase Dr Francis DowseJoel Fuhrman's book(s) "The End of  Dieting" & "Eat to Live" . It has some great concepts and recipes.

## 2017-04-16 NOTE — Telephone Encounter (Signed)
Pt was made aware of meds that were sent & ortho referral. Pt voiced understanding & hung up

## 2017-04-17 LAB — CBC WITH DIFFERENTIAL/PLATELET
Basophils Absolute: 59 cells/uL (ref 0–200)
Basophils Relative: 1 %
Eosinophils Absolute: 212 cells/uL (ref 15–500)
Eosinophils Relative: 3.6 %
HCT: 44.5 % (ref 38.5–50.0)
Hemoglobin: 14.4 g/dL (ref 13.2–17.1)
Lymphs Abs: 2413 cells/uL (ref 850–3900)
MCH: 26 pg — ABNORMAL LOW (ref 27.0–33.0)
MCHC: 32.4 g/dL (ref 32.0–36.0)
MCV: 80.3 fL (ref 80.0–100.0)
MPV: 9.6 fL (ref 7.5–12.5)
Monocytes Relative: 12.3 %
Neutro Abs: 2490 cells/uL (ref 1500–7800)
Neutrophils Relative %: 42.2 %
Platelets: 322 10*3/uL (ref 140–400)
RBC: 5.54 10*6/uL (ref 4.20–5.80)
RDW: 12.9 % (ref 11.0–15.0)
Total Lymphocyte: 40.9 %
WBC mixed population: 726 cells/uL (ref 200–950)
WBC: 5.9 10*3/uL (ref 3.8–10.8)

## 2017-04-17 LAB — LIPID PANEL
Cholesterol: 174 mg/dL (ref <200)
HDL: 58 mg/dL (ref >40)
LDL Cholesterol (Calc): 101 mg/dL (calc) — ABNORMAL HIGH
Non-HDL Cholesterol (Calc): 116 mg/dL (calc) (ref <130)
Total CHOL/HDL Ratio: 3 (calc) (ref <5.0)
Triglycerides: 60 mg/dL (ref <150)

## 2017-04-17 LAB — HEPATIC FUNCTION PANEL
AG Ratio: 1.1 (calc) (ref 1.0–2.5)
ALT: 9 U/L (ref 9–46)
AST: 21 U/L (ref 10–35)
Albumin: 4.2 g/dL (ref 3.6–5.1)
Alkaline phosphatase (APISO): 69 U/L (ref 40–115)
Bilirubin, Direct: 0.1 mg/dL (ref 0.0–0.2)
Globulin: 3.8 g/dL (calc) — ABNORMAL HIGH (ref 1.9–3.7)
Indirect Bilirubin: 0.3 mg/dL (calc) (ref 0.2–1.2)
Total Bilirubin: 0.4 mg/dL (ref 0.2–1.2)
Total Protein: 8 g/dL (ref 6.1–8.1)

## 2017-04-17 LAB — BASIC METABOLIC PANEL WITH GFR
BUN/Creatinine Ratio: 18 (calc) (ref 6–22)
BUN: 24 mg/dL (ref 7–25)
CO2: 25 mmol/L (ref 20–32)
Calcium: 9.7 mg/dL (ref 8.6–10.3)
Chloride: 102 mmol/L (ref 98–110)
Creat: 1.37 mg/dL — ABNORMAL HIGH (ref 0.70–1.25)
GFR, Est African American: 64 mL/min/{1.73_m2} (ref >=60)
GFR, Est Non African American: 55 mL/min/{1.73_m2} — ABNORMAL LOW (ref >=60)
Glucose, Bld: 93 mg/dL (ref 65–99)
Potassium: 4.3 mmol/L (ref 3.5–5.3)
Sodium: 137 mmol/L (ref 135–146)

## 2017-04-17 LAB — HEMOGLOBIN A1C
Hgb A1c MFr Bld: 6.6 % of total Hgb — ABNORMAL HIGH (ref <5.7)
Mean Plasma Glucose: 143 (calc)
eAG (mmol/L): 7.9 (calc)

## 2017-04-17 LAB — TSH: TSH: 1.58 mIU/L (ref 0.40–4.50)

## 2017-04-17 LAB — MAGNESIUM: Magnesium: 2.1 mg/dL (ref 1.5–2.5)

## 2017-04-17 LAB — CK: Total CK: 315 U/L — ABNORMAL HIGH (ref 44–196)

## 2017-04-17 LAB — IRON, TOTAL/TOTAL IRON BINDING CAP
%SAT: 13 % (calc) — ABNORMAL LOW (ref 15–60)
Iron: 48 ug/dL — ABNORMAL LOW (ref 50–180)
TIBC: 357 mcg/dL (calc) (ref 250–425)

## 2017-04-21 NOTE — Progress Notes (Signed)
Pt aware of lab results & voiced understanding of those results. Pt transferred to front office to schedule DM TEACHING in 1 mth                            CPK up, did you just work out or do physical activity before appointment?-YES per pt Pt made aware to not work out before appt as well.

## 2017-04-28 ENCOUNTER — Encounter: Payer: Self-pay | Admitting: Internal Medicine

## 2017-05-01 ENCOUNTER — Ambulatory Visit (INDEPENDENT_AMBULATORY_CARE_PROVIDER_SITE_OTHER): Payer: No Typology Code available for payment source | Admitting: Orthopaedic Surgery

## 2017-05-18 ENCOUNTER — Other Ambulatory Visit: Payer: Self-pay | Admitting: Internal Medicine

## 2017-06-03 ENCOUNTER — Other Ambulatory Visit: Payer: Self-pay | Admitting: Internal Medicine

## 2017-06-03 DIAGNOSIS — I10 Essential (primary) hypertension: Secondary | ICD-10-CM

## 2017-06-18 ENCOUNTER — Ambulatory Visit (INDEPENDENT_AMBULATORY_CARE_PROVIDER_SITE_OTHER): Payer: Medicare HMO | Admitting: Physician Assistant

## 2017-06-18 ENCOUNTER — Encounter: Payer: Self-pay | Admitting: Physician Assistant

## 2017-06-18 VITALS — BP 132/76 | HR 83 | Temp 97.6°F | Resp 16 | Ht 68.75 in | Wt 280.0 lb

## 2017-06-18 DIAGNOSIS — R748 Abnormal levels of other serum enzymes: Secondary | ICD-10-CM | POA: Diagnosis not present

## 2017-06-18 DIAGNOSIS — Z1159 Encounter for screening for other viral diseases: Secondary | ICD-10-CM | POA: Diagnosis not present

## 2017-06-18 DIAGNOSIS — D649 Anemia, unspecified: Secondary | ICD-10-CM

## 2017-06-18 DIAGNOSIS — I10 Essential (primary) hypertension: Secondary | ICD-10-CM

## 2017-06-18 DIAGNOSIS — E1142 Type 2 diabetes mellitus with diabetic polyneuropathy: Secondary | ICD-10-CM

## 2017-06-18 DIAGNOSIS — E782 Mixed hyperlipidemia: Secondary | ICD-10-CM

## 2017-06-18 DIAGNOSIS — E1169 Type 2 diabetes mellitus with other specified complication: Secondary | ICD-10-CM | POA: Diagnosis not present

## 2017-06-18 DIAGNOSIS — Z114 Encounter for screening for human immunodeficiency virus [HIV]: Secondary | ICD-10-CM | POA: Diagnosis not present

## 2017-06-18 DIAGNOSIS — E1122 Type 2 diabetes mellitus with diabetic chronic kidney disease: Secondary | ICD-10-CM | POA: Diagnosis not present

## 2017-06-18 DIAGNOSIS — N182 Chronic kidney disease, stage 2 (mild): Secondary | ICD-10-CM

## 2017-06-18 DIAGNOSIS — N521 Erectile dysfunction due to diseases classified elsewhere: Secondary | ICD-10-CM

## 2017-06-18 NOTE — Patient Instructions (Addendum)
Can take 1 or 2 gabapentin at dinner time and then 1 or 2 at bed time Can take a while for it to kick in or start working   Your A1C is a measure of your sugar over the past 3 months and is not affected by what you have eaten over the past few days. Diabetes increases your chances of stroke and heart attack over 300 % and is the leading cause of blindness and kidney failure in the [place]. Please make sure you decrease bad carbs like white bread, white rice, potatoes, corn, soft drinks, pasta, cereals, refined sugars, sweet tea, dried fruits, and fruit juice. Good carbs are okay to eat in moderation like sweet potatoes, brown rice, whole grain pasta/bread, most fruit (except dried fruit) and you can eat as many veggies as you want.   Greater than 6.5 is considered diabetic. Between 6.4 and 5.7 is prediabetic If your A1C is less than 5.7 you are NOT diabetic.  Targets for Glucose Readings: Time of Check Target for patients WITHOUT Diabetes Target for DIABETICS  Before Meals Less than 100  less than 150  Two hours after meals Less than 200  Less than 250    If your morning sugar is always below 100 then the issue is with your sugar spiking after meals. Try to take your blood sugar approximately 2 hours after eating, this number should be less than 200. If it is not, think about the foods that you ate and better choices you can make.      Bad carbs also include fruit juice, alcohol, and sweet tea. These are empty calories that do not signal to your brain that you are full.   Please remember the good carbs are still carbs which convert into sugar. So please measure them out no more than 1/2-1 cup of rice, oatmeal, pasta, and beans  Veggies are however free foods! Pile them on.   Not all fruit is created equal. Please see the list below, the fruit at the bottom is higher in sugars than the fruit at the top. Please avoid all dried fruits.

## 2017-06-18 NOTE — Progress Notes (Signed)
Diabetes Education and Follow-Up Visit  61 y.o.male presents for diabetic education. He has Diabetes Mellitus type 2: neuropathy, CKD, and ED, he is on bASA added last visit, and denies polydipsia, polyuria and visual disturbances.  Last hemoglobin A1c was: Lab Results  Component Value Date   HGBA1C 6.6 (H) 04/16/2017   HGBA1C 6.3 (H) 01/08/2017   HGBA1C 6.8 (H) 10/07/2016    Body mass index is 41.65 kg/m.  Wt Readings from Last 3 Encounters:  06/18/17 280 lb (127 kg)  04/16/17 278 lb 3.2 oz (126.2 kg)  01/08/17 281 lb (127.5 kg)    Pt is on a regimen of: Metformin 500 2 a day in the AM, he is on farxiga 10.   Pt does not check his sugars recently.  Glucometer: Accu chek  Eye doctor 04/03/2017  Exercise: he does exercise, has not exercises because his dad stays with him. He did not work out today or yesterday. HIS CPK WAS ELEVATED LAST VISIT, WILL CHECK TODAY. Lab Results  Component Value Date   CKTOTAL 315 (H) 04/16/2017   Patient does have CKD He is on ACE/ARB, losartan  Lab Results  Component Value Date   GFRAA 64 04/16/2017   Lab Results  Component Value Date   CREATININE 1.37 (H) 04/16/2017   BUN 24 04/16/2017   NA 137 04/16/2017   K 4.3 04/16/2017   CL 102 04/16/2017   CO2 25 04/16/2017   Lab Results  Component Value Date   MICROALBUR 5.9 06/02/2016     He is on a Statin. Recently decreased due to elevated CPK He is not at goal of less than 70.  Lab Results  Component Value Date   CHOL 174 04/16/2017   HDL 58 04/16/2017   LDLCALC 113 (H) 01/08/2017   TRIG 60 04/16/2017   CHOLHDL 3.0 04/16/2017   He is on iron pill, 65mg  3 x a week. Last colonoscopy 2013, he is on metformin. Lab Results  Component Value Date   IRON 48 (L) 04/16/2017   TIBC 357 04/16/2017    Problem List has HTN (hypertension); Mixed hyperlipidemia; GERD (gastroesophageal reflux disease); Morbid obesity (BMI 45+); Vitamin D deficiency; T2_NIDDM w/ Stage 2 CKD (GFR 77   ml/min); Medication management; Aneurysm artery, iliac common (HCC); Chronic pain syndrome; Peripheral sensory neuropathy due to T2_NIDDM; Erectile dysfunction associated with type 2 diabetes mellitus (HCC); and Chronic lower back pain on his problem list.  Medications Current Outpatient Prescriptions on File Prior to Visit  Medication Sig  . aspirin EC 81 MG tablet Take 1 tablet (81 mg total) by mouth daily.  Marland Kitchen. CARTIA XT 120 MG 24 hr capsule TAKE 1 CAPSULE EVERY DAY FOR BLOOD PRESSURE  . Cholecalciferol (VITAMIN D-3) 5000 UNITS TABS Take 1 tablet by mouth daily.  . cyclobenzaprine (FLEXERIL) 10 MG tablet Take 1 tablet (10 mg total) by mouth 2 (two) times daily as needed for muscle spasms.  . dapagliflozin propanediol (FARXIGA) 10 MG TABS tablet Take 10 mg by mouth daily.  Marland Kitchen. glucose blood (ACCU-CHEK SMARTVIEW) test strip Use as instructed  . losartan (COZAAR) 100 MG tablet TAKE 1 TABLET EVERY DAY FOR BLOOD PRESSURE  . Magnesium-Zinc (MAGNESIUM-CHELATED ZINC PO) Take 400 mg by mouth daily. Takes 1200 mg daily.  . meloxicam (MOBIC) 7.5 MG tablet Take 1 tablet (7.5 mg total) by mouth daily.  . metFORMIN (GLUCOPHAGE-XR) 500 MG 24 hr tablet TAKE 2 TABLETS TWICE DAILY  FOR  DIABETES  . Multiple Vitamins-Minerals (THERAGRAN-M PREMIER 50 PLUS PO)  Take 1 tablet by mouth daily.  . Omega-3 Fatty Acids (FISH OIL) 500 MG CAPS Take by mouth daily.  . phentermine (ADIPEX-P) 37.5 MG tablet TAKE 1/2 - 1 TABLET BY MOUTH EVERY MORNING  . rosuvastatin (CRESTOR) 40 MG tablet Take 1/2 to 1 tablet daily or as directed for Cholesterol  . gabapentin (NEURONTIN) 100 MG capsule Take 1 tablet 3 x/day for neuropathy pains in hands   No current facility-administered medications on file prior to visit.     ROS- see HPI  Physical Exam: Blood pressure 132/76, pulse 83, temperature 97.6 F (36.4 C), resp. rate 16, height 5' 8.75" (1.746 m), weight 280 lb (127 kg), SpO2 95 %. Body mass index is 41.65 kg/m. General  Appearance: Well nourished, in no apparent distress. Eyes: PERRLA, EOMs, conjunctiva no swelling or erythema ENT/Mouth: Ext aud canals clear, TMs without erythema, bulging. No erythema, swelling, or exudate on post pharynx.  Tonsils not swollen or erythematous. Hearing normal.  Respiratory: Respiratory effort normal, BS equal bilaterally without rales, rhonchi, wheezing or stridor.  Cardio: RRR with no MRGs. Brisk peripheral pulses without edema.  Abdomen: Soft, + BS.  Non tender, no guarding, rebound, hernias, masses. Musculoskeletal: Full ROM, 5/5 strength, normal gait.  Skin: Warm, dry without rashes, lesions, ecchymosis.  Neuro: Cranial nerves intact. Normal muscle tone, no cerebellar symptoms. Sensation intact.    Plan and Assessment: Diabetes Education: Reviewed 'ABCs' of diabetes management (respective goals in parentheses):  A1C (<7), blood pressure (<130/80), and cholesterol (LDL <70) Eye Exam yearly and Dental Exam every 6 months. Dietary recommendations Physical Activity recommendations - Strongly advised him to start checking sugars at different times of the day - check 2 times a day, rotating checks - given sugar log and advised how to fill it and to bring it at next appt  - given foot care handout and explained the principles  - given instructions for hypoglycemia management    Future Appointments Date Time Provider Department Center  07/17/2017 11:15 AM Lucky Cowboy, MD [facility] None

## 2017-06-19 LAB — CK: Total CK: 352 U/L — ABNORMAL HIGH (ref 44–196)

## 2017-06-19 LAB — HIV ANTIBODY (ROUTINE TESTING W REFLEX): HIV 1&2 Ab, 4th Generation: NONREACTIVE

## 2017-06-19 LAB — HEPATITIS C ANTIBODY
Hepatitis C Ab: NONREACTIVE
SIGNAL TO CUT-OFF: 0.03 (ref <1.00)

## 2017-07-02 ENCOUNTER — Encounter: Payer: Self-pay | Admitting: Internal Medicine

## 2017-07-16 NOTE — Progress Notes (Signed)
ADULT & ADOLESCENT INTERNAL MEDICINE   [name] [name], M.D.     [name] [name], P.A.-C [name] GaudierAshley Corbett, DNP [name]                [address]                [place], [place] [id number] Telephone [phone] Telefax [phone] Annual  Screening/Preventative Visit  & Comprehensive Evaluation & Examination     This very nice 61 y.o. single BM presents for a Screening/Preventative Visit & comprehensive evaluation and management of multiple medical co-morbidities.  Patient has been followed for HTN, T2_DM/CKD2, Hyperlipidemia, OSA/CPAP and Vitamin D Deficiency.  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 Patient reports improved sleep hygiene w/CPAP and less daytime excessive sleepiness.     HTN predates since 2007. Patient's BP has been controlled at home.  Today's BP is at goal - 134/82. Patient denies any cardiac symptoms as chest pain, palpitations, shortness of breath, dizziness or ankle swelling.     Patient's hyperlipidemia is not controlled with poorly controlled diet and he apparently is off of his Crestor !  Patient denies myalgias or other medication SE's. Last lipids were not at goal: Lab Results  Component Value Date   CHOL 174 04/16/2017   HDL 58 04/16/2017   LDLCALC 113 (H) 01/08/2017   TRIG 60 04/16/2017   CHOLHDL 3.0 04/16/2017      Patient has Gluttony / Morbid Obesity (BMI 45+) and T2_NIDDM since 2014 when he was hospitalized with Non ketotic hyperosmolar ketoacidosis and hemiballismus and Glucose > 1000 mg %.  Patient denies reactive hypoglycemic symptoms, visual blurring, diabetic polys, but does report paresthesias of the soles of his feet. Last A1c was not at goal consequent of his Gluttony: Lab Results  Component Value Date   HGBA1C 6.6 (H) 04/16/2017       Finally, patient has history of Vitamin D Deficiency of    and last  vitamin D was  Lab Results  Component Value Date   VD25OH 87 01/08/2017   Current Outpatient Medications on File Prior to Visit  Medication Sig  . Ascorbic Acid (VITA-C PO) Take by mouth.  Marland Kitchen. aspirin EC 81 MG tablet Take 1 tablet (81 mg total) by mouth daily.  Marland Kitchen. CARTIA XT 120 MG 24 hr capsule TAKE 1 CAPSULE EVERY DAY FOR BLOOD PRESSURE  . Cholecalciferol (VITAMIN D-3) 5000 UNITS TABS Take 1 tablet by mouth daily.  . dapagliflozin propanediol (FARXIGA) 10 MG TABS tablet Take 10 mg by mouth daily.  Marland Kitchen. glucose blood (ACCU-CHEK SMARTVIEW) test strip Use as instructed  . losartan (COZAAR) 100 MG tablet TAKE 1 TABLET EVERY DAY FOR BLOOD PRESSURE  . Magnesium-Zinc (MAGNESIUM-CHELATED ZINC PO) Take 400 mg by mouth daily. Takes 1200 mg daily.  . metFORMIN (GLUCOPHAGE-XR) 500 MG 24 hr tablet TAKE 2 TABLETS TWICE DAILY  FOR  DIABETES  . Multiple Vitamins-Minerals (THERAGRAN-M PREMIER 50 PLUS PO) Take 1 tablet by mouth daily.  . Omega-3 Fatty Acids (FISH OIL) 500 MG CAPS Take by mouth daily.  . phentermine (ADIPEX-P) 37.5 MG tablet TAKE 1/2 - 1 TABLET BY MOUTH EVERY MORNING  . gabapentin (NEURONTIN) 100 MG capsule Take 1 tablet 3 x/day for neuropathy pains in hands  . rosuvastatin (CRESTOR) 40 MG tablet Take 1/2 to 1 tablet daily or as directed for Cholesterol (Patient not taking: Reported  on 07/17/2017)   No current facility-administered medications on file prior to visit.    No Known Allergies Past Medical History:  Diagnosis Date  . Vitamin D deficiency    Health Maintenance  Topic Date Due  . HEMOGLOBIN A1C  10/15/2017  . PNEUMOCOCCAL POLYSACCHARIDE VACCINE (2) 02/16/2018  . OPHTHALMOLOGY EXAM  04/03/2018  . FOOT EXAM  04/16/2018  . TETANUS/TDAP  02/09/2021  . COLONOSCOPY  03/10/2022  . Hepatitis C Screening  Completed  . HIV Screening  Completed   Immunization History  Administered Date(s) Administered  . Pneumococcal-Unspecified 02/16/2013  . Tdap 02/10/2011   Past Surgical  History:  Procedure Laterality Date  . CARPAL TUNNEL RELEASE  2008   bilateral  . CERVICAL DISCECTOMY  2003  . KNEE ARTHROSCOPY  2010   left   Family History  Problem Relation Age of Onset  . Diabetes Father   . Hypertension Father   . Diabetes Sister   . Kidney disease Sister   . Heart disease Mother   . Stroke Mother   . Diabetes Mother   . Colon cancer Neg Hx   . Stomach cancer Neg Hx    Social History   Socioeconomic History  . Marital status: Single    Spouse name: Not on file  . Number of children: Not on file  . Years of education: Not on file  . Highest education level: Not on file  Social Needs  . Financial resource strain: Not on file  . Food insecurity - worry: Not on file  . Food insecurity - inability: Not on file  . Transportation needs - medical: Not on file  . Transportation needs - non-medical: Not on file  Occupational History  . Not on file  Tobacco Use  . Smoking status: Former Smoker    Last attempt to quit: 02/25/1991    Years since quitting: 26.4  . Smokeless tobacco: Never Used  Substance and Sexual Activity  . Alcohol use: No  . Drug use: No  . Sexual activity: Not on file  Other Topics Concern  . Not on file  Social History Narrative  . Not on file    ROS Constitutional: Denies fever, chills, weight loss/gain, headaches, insomnia,  night sweats or change in appetite. Does c/o fatigue. Eyes: Denies redness, blurred vision, diplopia, discharge, itchy or watery eyes.  ENT: Denies discharge, congestion, post nasal drip, epistaxis, sore throat, earache, hearing loss, dental pain, Tinnitus, Vertigo, Sinus pain or snoring.  Cardio: Denies chest pain, palpitations, irregular heartbeat, syncope, dyspnea, diaphoresis, orthopnea, PND, claudication or edema Respiratory: denies cough, dyspnea, DOE, pleurisy, hoarseness, laryngitis or wheezing.  Gastrointestinal: Denies dysphagia, heartburn, reflux, water brash, pain, cramps, nausea, vomiting,  bloating, diarrhea, constipation, hematemesis, melena, hematochezia, jaundice or hemorrhoids Genitourinary: Denies dysuria, frequency, urgency, nocturia, hesitancy, discharge, hematuria or flank pain Musculoskeletal: Denies arthralgia, myalgia, stiffness, Jt. Swelling, pain, limp or strain/sprain. Denies Falls. Skin: Denies puritis, rash, hives, warts, acne, eczema or change in skin lesion Neuro: No weakness, tremor, incoordination, spasms, paresthesia or pain Psychiatric: Denies confusion, memory loss or sensory loss. Denies Depression. Endocrine: Denies change in weight, skin, hair change, nocturia, and paresthesia, diabetic polys, visual blurring or hyper / hypo glycemic episodes.  Heme/Lymph: No excessive bleeding, bruising or enlarged lymph nodes.  Physical Exam  BP 134/82   Pulse 92   Temp 97.8 F (36.6 C)   Resp 20   Ht 5\' 9"  (1.753 m)   Wt 287 lb 12.8 oz (130.5 kg)   BMI  42.50 kg/m   General Appearance: Over nourished and in no apparent distress.  Eyes: PERRLA, EOMs, conjunctiva no swelling or erythema, normal fundi and vessels. Sinuses: No frontal/maxillary tenderness ENT/Mouth: EACs patent / TMs  nl. Nares clear without erythema, swelling, mucoid exudates. Oral hygiene is good. No erythema, swelling, or exudate. Tongue normal, non-obstructing. Tonsils not swollen or erythematous. Hearing normal.  Neck: Supple, thyroid normal. No bruits, nodes or JVD. Respiratory: Respiratory effort normal.  BS equal and clear bilateral without rales, rhonci, wheezing or stridor. Cardio: Heart sounds are normal with regular rate and rhythm and no murmurs, rubs or gallops. Peripheral pulses are normal and equal bilaterally without edema. No aortic or femoral bruits. Chest: symmetric with normal excursions and percussion.  Abdomen: Soft, with Nl bowel sounds. Nontender, no guarding, rebound, hernias, masses, or organomegaly.  Lymphatics: Non tender without lymphadenopathy.  Genitourinary: No  hernias.Testes nl. DRE - prostate nl for age - smooth & firm w/o nodules. Musculoskeletal: Full ROM all peripheral extremities, joint stability, 5/5 strength, and normal gait. Skin: Warm and dry without rashes, lesions, cyanosis, clubbing or  ecchymosis.  Neuro: Cranial nerves intact, reflexes equal bilaterally. Normal muscle tone, no cerebellar symptoms. Sensation intact.  Pysch: Alert and oriented X 3 with normal affect, insight and judgment appropriate.   Assessment and Plan  1. Annual Preventative/Screening Exam   2. Essential hypertension  - EKG 12-Lead - [place], RETROPERITNL ABD,  LTD - Urinalysis, Routine w reflex microscopic - Microalbumin / creatinine urine ratio - CBC with Differential/Platelet - BASIC METABOLIC PANEL WITH GFR - Magnesium - TSH  3. Hyperlipidemia, mixed  - EKG 12-Lead - [place], RETROPERITNL ABD,  LTD - Hepatic function panel - Lipid panel - TSH  4. Type 2 diabetes mellitus with stage 2 chronic kidney disease, without long-term current use of insulin (HCC)  - EKG 12-Lead - [place], RETROPERITNL ABD,  LTD - Urinalysis, Routine w reflex microscopic - Microalbumin / creatinine urine ratio - Hemoglobin A1c - Insulin, random  5. Vitamin D deficiency  - VITAMIN D 25 Hydroxy  6. Morbid obesity (BMI 45+)  - Hemoglobin A1c  7. Screening for colorectal cancer  - POC Hemoccult Bld/Stl  8. Prostate cancer screening  - PSA  9. Screening for ischemic heart disease  - EKG 12-Lead - Lipid panel  10. Screening for AAA (aortic abdominal aneurysm)  - [place], RETROPERITNL ABD,  LTD  11. Smoker  - EKG 12-Lead - [place], RETROPERITNL ABD,  LTD  12. Medication management  - Urinalysis, Routine w reflex microscopic - Microalbumin / creatinine urine ratio - CBC with Differential/Platelet - BASIC METABOLIC PANEL WITH GFR - Hepatic function panel - Magnesium - Lipid panel - TSH - Hemoglobin A1c - Insulin, random - VITAMIN D 25 Hydroxy   13. Aneurysm artery,  iliac common (HCC)       Patient was counseled in prudent diet, weight control to achieve/maintain BMI less than 25, BP monitoring, regular exercise and medications as discussed.  Discussed med effects and SE's. Routine screening labs and tests as requested with regular follow-up as recommended. Over 40 minutes of exam, counseling, chart review and high complex critical decision making was performed

## 2017-07-16 NOTE — Patient Instructions (Signed)

## 2017-07-17 ENCOUNTER — Ambulatory Visit (INDEPENDENT_AMBULATORY_CARE_PROVIDER_SITE_OTHER): Payer: Medicare HMO | Admitting: Internal Medicine

## 2017-07-17 VITALS — BP 134/82 | HR 92 | Temp 97.8°F | Resp 20 | Ht 69.0 in | Wt 287.8 lb

## 2017-07-17 DIAGNOSIS — I10 Essential (primary) hypertension: Secondary | ICD-10-CM

## 2017-07-17 DIAGNOSIS — F17200 Nicotine dependence, unspecified, uncomplicated: Secondary | ICD-10-CM

## 2017-07-17 DIAGNOSIS — Z125 Encounter for screening for malignant neoplasm of prostate: Secondary | ICD-10-CM

## 2017-07-17 DIAGNOSIS — E782 Mixed hyperlipidemia: Secondary | ICD-10-CM

## 2017-07-17 DIAGNOSIS — E1122 Type 2 diabetes mellitus with diabetic chronic kidney disease: Secondary | ICD-10-CM

## 2017-07-17 DIAGNOSIS — Z79899 Other long term (current) drug therapy: Secondary | ICD-10-CM | POA: Diagnosis not present

## 2017-07-17 DIAGNOSIS — Z0000 Encounter for general adult medical examination without abnormal findings: Secondary | ICD-10-CM | POA: Diagnosis not present

## 2017-07-17 DIAGNOSIS — Z1212 Encounter for screening for malignant neoplasm of rectum: Secondary | ICD-10-CM

## 2017-07-17 DIAGNOSIS — Z1211 Encounter for screening for malignant neoplasm of colon: Secondary | ICD-10-CM

## 2017-07-17 DIAGNOSIS — N182 Chronic kidney disease, stage 2 (mild): Secondary | ICD-10-CM | POA: Diagnosis not present

## 2017-07-17 DIAGNOSIS — R632 Polyphagia: Secondary | ICD-10-CM

## 2017-07-17 DIAGNOSIS — Z136 Encounter for screening for cardiovascular disorders: Secondary | ICD-10-CM | POA: Diagnosis not present

## 2017-07-17 DIAGNOSIS — E559 Vitamin D deficiency, unspecified: Secondary | ICD-10-CM

## 2017-07-17 DIAGNOSIS — I723 Aneurysm of iliac artery: Secondary | ICD-10-CM

## 2017-07-17 DIAGNOSIS — Z0001 Encounter for general adult medical examination with abnormal findings: Secondary | ICD-10-CM

## 2017-07-19 ENCOUNTER — Encounter: Payer: Self-pay | Admitting: Internal Medicine

## 2017-07-19 DIAGNOSIS — R632 Polyphagia: Secondary | ICD-10-CM | POA: Insufficient documentation

## 2017-07-20 LAB — BASIC METABOLIC PANEL WITH GFR
BUN: 18 mg/dL (ref 7–25)
CO2: 24 mmol/L (ref 20–32)
Calcium: 9.5 mg/dL (ref 8.6–10.3)
Chloride: 102 mmol/L (ref 98–110)
Creat: 1.17 mg/dL (ref 0.70–1.25)
GFR, Est African American: 78 mL/min/{1.73_m2} (ref >=60)
GFR, Est Non African American: 67 mL/min/{1.73_m2} (ref >=60)
Glucose, Bld: 138 mg/dL — ABNORMAL HIGH (ref 65–99)
Potassium: 3.9 mmol/L (ref 3.5–5.3)
Sodium: 137 mmol/L (ref 135–146)

## 2017-07-20 LAB — URINALYSIS, ROUTINE W REFLEX MICROSCOPIC
Bilirubin Urine: NEGATIVE
Hgb urine dipstick: NEGATIVE
Ketones, ur: NEGATIVE
Leukocytes, UA: NEGATIVE
Nitrite: NEGATIVE
Protein, ur: NEGATIVE
Specific Gravity, Urine: 1.027 (ref 1.001–1.03)
pH: 5.5 (ref 5.0–8.0)

## 2017-07-20 LAB — VITAMIN D 25 HYDROXY (VIT D DEFICIENCY, FRACTURES): Vit D, 25-Hydroxy: 62 ng/mL (ref 30–100)

## 2017-07-20 LAB — CBC WITH DIFFERENTIAL/PLATELET
Basophils Absolute: 68 cells/uL (ref 0–200)
Basophils Relative: 1.3 %
Eosinophils Absolute: 229 cells/uL (ref 15–500)
Eosinophils Relative: 4.4 %
HCT: 46.1 % (ref 38.5–50.0)
Hemoglobin: 14.9 g/dL (ref 13.2–17.1)
Lymphs Abs: 2070 cells/uL (ref 850–3900)
MCH: 26 pg — ABNORMAL LOW (ref 27.0–33.0)
MCHC: 32.3 g/dL (ref 32.0–36.0)
MCV: 80.6 fL (ref 80.0–100.0)
MPV: 9.8 fL (ref 7.5–12.5)
Monocytes Relative: 11.5 %
Neutro Abs: 2236 cells/uL (ref 1500–7800)
Neutrophils Relative %: 43 %
Platelets: 313 10*3/uL (ref 140–400)
RBC: 5.72 10*6/uL (ref 4.20–5.80)
RDW: 13 % (ref 11.0–15.0)
Total Lymphocyte: 39.8 %
WBC mixed population: 598 cells/uL (ref 200–950)
WBC: 5.2 10*3/uL (ref 3.8–10.8)

## 2017-07-20 LAB — LIPID PANEL
Cholesterol: 241 mg/dL — ABNORMAL HIGH (ref <200)
HDL: 53 mg/dL (ref >40)
LDL Cholesterol (Calc): 161 mg/dL (calc) — ABNORMAL HIGH
Non-HDL Cholesterol (Calc): 188 mg/dL (calc) — ABNORMAL HIGH (ref <130)
Total CHOL/HDL Ratio: 4.5 (calc) (ref <5.0)
Triglycerides: 145 mg/dL (ref <150)

## 2017-07-20 LAB — HEPATIC FUNCTION PANEL
AG Ratio: 1.1 (calc) (ref 1.0–2.5)
ALT: 7 U/L — ABNORMAL LOW (ref 9–46)
AST: 19 U/L (ref 10–35)
Albumin: 4.1 g/dL (ref 3.6–5.1)
Alkaline phosphatase (APISO): 69 U/L (ref 40–115)
Bilirubin, Direct: 0.1 mg/dL (ref 0.0–0.2)
Globulin: 3.6 g/dL (calc) (ref 1.9–3.7)
Indirect Bilirubin: 0.4 mg/dL (calc) (ref 0.2–1.2)
Total Bilirubin: 0.5 mg/dL (ref 0.2–1.2)
Total Protein: 7.7 g/dL (ref 6.1–8.1)

## 2017-07-20 LAB — PSA: PSA: 0.8 ng/mL (ref <=4.0)

## 2017-07-20 LAB — HEMOGLOBIN A1C
Hgb A1c MFr Bld: 6.3 % of total Hgb — ABNORMAL HIGH (ref <5.7)
Mean Plasma Glucose: 134 (calc)
eAG (mmol/L): 7.4 (calc)

## 2017-07-20 LAB — MAGNESIUM: Magnesium: 2 mg/dL (ref 1.5–2.5)

## 2017-07-20 LAB — MICROALBUMIN / CREATININE URINE RATIO
Creatinine, Urine: 82 mg/dL (ref 20–320)
Microalb Creat Ratio: 23 mcg/mg creat (ref <30)
Microalb, Ur: 1.9 mg/dL

## 2017-07-20 LAB — TSH: TSH: 2.21 mIU/L (ref 0.40–4.50)

## 2017-07-20 LAB — INSULIN, RANDOM: Insulin: 53.3 u[IU]/mL — ABNORMAL HIGH (ref 2.0–19.6)

## 2017-07-30 ENCOUNTER — Other Ambulatory Visit: Payer: Self-pay | Admitting: *Deleted

## 2017-07-30 DIAGNOSIS — Z1211 Encounter for screening for malignant neoplasm of colon: Secondary | ICD-10-CM

## 2017-07-30 DIAGNOSIS — Z1212 Encounter for screening for malignant neoplasm of rectum: Secondary | ICD-10-CM | POA: Diagnosis not present

## 2017-07-30 LAB — POC HEMOCCULT BLD/STL (HOME/3-CARD/SCREEN)
Card #2 Fecal Occult Blod, POC: NEGATIVE
Card #3 Fecal Occult Blood, POC: NEGATIVE
Fecal Occult Blood, POC: NEGATIVE

## 2017-07-30 MED ORDER — ACCU-CHEK NANO SMARTVIEW W/DEVICE KIT: PACK | 0 refills | Status: DC

## 2017-07-30 MED ORDER — GLUCOSE BLOOD VI STRP: ORAL_STRIP | 12 refills | Status: DC

## 2017-08-05 ENCOUNTER — Other Ambulatory Visit: Payer: Self-pay | Admitting: Internal Medicine

## 2017-08-17 ENCOUNTER — Ambulatory Visit (INDEPENDENT_AMBULATORY_CARE_PROVIDER_SITE_OTHER): Payer: Medicare HMO | Admitting: Internal Medicine

## 2017-08-17 ENCOUNTER — Encounter: Payer: Self-pay | Admitting: Internal Medicine

## 2017-08-17 VITALS — BP 112/84 | HR 88 | Temp 97.8°F | Resp 18 | Ht 69.0 in | Wt 289.6 lb

## 2017-08-17 DIAGNOSIS — N182 Chronic kidney disease, stage 2 (mild): Secondary | ICD-10-CM

## 2017-08-17 DIAGNOSIS — E1122 Type 2 diabetes mellitus with diabetic chronic kidney disease: Secondary | ICD-10-CM | POA: Diagnosis not present

## 2017-08-17 DIAGNOSIS — I10 Essential (primary) hypertension: Secondary | ICD-10-CM | POA: Diagnosis not present

## 2017-08-17 NOTE — Patient Instructions (Signed)

## 2017-08-17 NOTE — Progress Notes (Signed)
  Subjective:    Patient ID: Steva ColderRonnie J Coulston, male    DOB: 12/15/1955, 61 y.o.   MRN: [id number]  HPI     This 61 yo single BM w/T2_DM returns for 1 month f/u to review FBG's in f/u of recent OV and elevated A1c 6.3%. Patient reports daily FBG's are ranging betw 75-100 and denies any sx's of hypoglycemia, diabetic polys or visual blurring, but does have paresthesias of his feet. Still only taking MF 2 tabs/daily  Medication Sig  . VIT-C Take daily  . aspirin EC 81 MG Take 1 tablet (81 mg total) by mouth daily.  Marland Kitchen. CARTIA XT 120 MG 24 hr TAKE 1 CAPSULE EVERY DAY FOR BLOOD PRESSURE  . VIT D-3 5000 UNITS Take 1 tablet by mouth daily.  Marland Kitchen. FARXIGA 10 MG TABS Take 10 mg by mouth daily.  Marland Kitchen. losartan 100 MG tablet TAKE 1 TABLET EVERY DAY FOR BLOOD PRESSURE  . Magnesium-Zinc  Take 400 mg by mouth daily. Takes 1200 mg daily.  . metFORMIN -XR 500 MG  TAKE 2 TABLETS TWICE DAILY  FOR  DIABETES  . Multiple Vitamins-Min Take 1 tablet by mouth daily.  . Omega-3 FISH OIL 500 MG Take by mouth daily.  . phentermine 37.5 MG tablet TAKE 1/2 - 1 TABLET BY MOUTH EVERY MORNING  . sildenafil  20 MG tablet TAKE TWO TO FIVE TABLETS BY MOUTH DAILY AS NEEDED  . gabapentin  100 MG capsule Take 1 tablet 3 x/day for neuropathy pains in hands  . rosuvastatin  40 MG tablet Take 1/2-1 tablet daily (Patient not taking: Reported on 07/17/2017)   No Known Allergies   Past Medical History:  Diagnosis Date  . Vitamin D deficiency    Review of Systems  10 point systems review negative except as above.    Objective:   Physical Exam BP 112/84   Pulse 88   Temp 97.8 F (36.6 C)   Resp 18   Ht 5\' 9"  (1.753 m)   Wt 289 lb 9.6 oz (131.4 kg)   BMI 42.77 kg/m   HEENT - WNL. Neck - supple.  Chest - Clear equal BS. Cor - Nl HS. RRR w/o sig MGR. PP 1(+). 2+ chronc0  MS- FROM w/o deformities.  Gait Nl. Neuro -  Nl w/o focal abnormalities.    Assessment & Plan:   1. Essential hypertension  2. Type 2 diabetes mellitus with  stage 2 chronic kidney disease, without long-term current use of insulin (HCC)  - discussed Diabetic meals and portion sizes.   - advised to increase Metformin to 2 tabs = 4 tabs/day and then stop Farxiga  Over 15minutes of exam, counseling, chart review and  critical decision making was performed

## 2017-09-04 DIAGNOSIS — H5203 Hypermetropia, bilateral: Secondary | ICD-10-CM | POA: Diagnosis not present

## 2017-09-04 DIAGNOSIS — H52209 Unspecified astigmatism, unspecified eye: Secondary | ICD-10-CM | POA: Diagnosis not present

## 2017-09-04 DIAGNOSIS — H524 Presbyopia: Secondary | ICD-10-CM | POA: Diagnosis not present

## 2017-09-11 ENCOUNTER — Other Ambulatory Visit: Payer: Self-pay | Admitting: Internal Medicine

## 2017-09-11 ENCOUNTER — Other Ambulatory Visit: Payer: Self-pay | Admitting: Adult Health

## 2017-09-12 ENCOUNTER — Other Ambulatory Visit: Payer: Self-pay | Admitting: Internal Medicine

## 2017-09-29 ENCOUNTER — Other Ambulatory Visit: Payer: Self-pay | Admitting: *Deleted

## 2017-09-29 MED ORDER — GLUCOSE BLOOD VI STRP: ORAL_STRIP | 4 refills | Status: DC

## 2017-09-29 MED ORDER — ACCU-CHEK GUIDE W/DEVICE KIT: 1.0000 | PACK | Freq: Every day | 0 refills | Status: DC

## 2017-09-30 ENCOUNTER — Other Ambulatory Visit: Payer: Self-pay | Admitting: *Deleted

## 2017-09-30 MED ORDER — ACCU-CHEK GUIDE W/DEVICE KIT: 1.0000 | PACK | Freq: Every day | 0 refills | Status: DC

## 2017-09-30 MED ORDER — GLUCOSE BLOOD VI STRP: ORAL_STRIP | 4 refills | Status: DC

## 2017-10-08 ENCOUNTER — Ambulatory Visit (INDEPENDENT_AMBULATORY_CARE_PROVIDER_SITE_OTHER): Payer: Medicare HMO | Admitting: Internal Medicine

## 2017-10-08 VITALS — BP 126/82 | HR 92 | Temp 97.9°F | Resp 18 | Ht 69.0 in | Wt 294.0 lb

## 2017-10-08 DIAGNOSIS — M791 Myalgia, unspecified site: Secondary | ICD-10-CM | POA: Diagnosis not present

## 2017-10-11 ENCOUNTER — Encounter: Payer: Self-pay | Admitting: Internal Medicine

## 2017-10-11 NOTE — Progress Notes (Signed)
  Subjective:    Patient ID: [name] [name], male    DOB: January 26, 1956, 62 y.o.   MRN: [id number]  HPI  This nice 62 yo  morbidly obese single  BM with HTN, HLD, T2_DM and who is on Crestor had this med increased at last visit in Nov 2018  from Crestor 1/2 tab 3 x/ week to daily and has since developed myalgias in his proximal limb girdle musculature lower> upper.   Medication Sig  . Ascorbic Acid (VITA-C PO) Take by mouth.  Marland Kitchen aspirin EC 81 MG tablet Take 1 tablet (81 mg total) by mouth daily.  . Blood Glucose Monitoring Suppl (ACCU-CHEK GUIDE) w/Device KIT 1 kit by Does not apply route daily. Check blood sugar 1 time a day-DX-E11.22  . CARTIA XT 120 MG 24 hr capsule TAKE 1 CAPSULE EVERY DAY FOR BLOOD PRESSURE  . Cholecalciferol (VITAMIN D-3) 5000 UNITS TABS Take 1 tablet by mouth daily.  Marland Kitchen glucose blood (ACCU-CHEK GUIDE) test strip Check blood sugar 1 time a day-DX-E11.22  . losartan (COZAAR) 100 MG tablet TAKE 1 TABLET EVERY DAY FOR BLOOD PRESSURE  . Magnesium-Zinc (MAGNESIUM-CHELATED ZINC PO) Take 400 mg by mouth daily. Takes 1200 mg daily.  . metFORMIN (GLUCOPHAGE-XR) 500 MG 24 hr tablet TAKE 2 TABLETS TWICE DAILY  FOR  DIABETES  . Multiple Vitamins-Minerals (THERAGRAN-M PREMIER 50 PLUS PO) Take 1 tablet by mouth daily.  . Omega-3 Fatty Acids (FISH OIL) 500 MG CAPS Take by mouth daily.  . phentermine (ADIPEX-P) 37.5 MG tablet TAKE 1/2-1 TABLET BY MOUTH EVERY MORNING  . sildenafil (REVATIO) 20 MG tablet TAKE 2-5 TABLETS BY MOUTH DAILY AS NEEDED  . gabapentin (NEURONTIN) 100 MG capsule Take 1 tablet 3 x/day for neuropathy pains in hands  . rosuvastatin (CRESTOR) 40 MG tablet Take 1/2 to 1 tablet daily or as directed for Cholesterol (Patient not taking: Reported on 07/17/2017)  . dapagliflozin propanediol (FARXIGA) 10 MG TABS tablet Take 10 mg by mouth daily.   No Known Allergies   Past Surgical History:  Procedure Laterality Date  . CARPAL TUNNEL RELEASE  2008   bilateral  . CERVICAL  DISCECTOMY  2003  . KNEE ARTHROSCOPY  2010   left   Review of Systems  10 point systems review negative except as above.    Objective:   Physical Exam  BP 126/82   Pulse 92   Temp 97.9 F (36.6 C)   Resp 18   Ht _0  (1.753 m)   Wt 294 lb (133.4 kg)   BMI 43.42 kg/m   HEENT - WNL. Neck - supple.  Chest - Clear equal BS. Cor - Nl HS. RRR w/o sig MGR. PP 1(+). No edema. MS- FROM w/o deformities.  Gait Nl. Neuro -  Nl w/o focal abnormalities.    Assessment & Plan:   1. Myalgia  - Recommended stop Crestor to see if myalgias resolve and if so the restart Crestor at 3 x/ week til upcoming OV in early mar.

## 2017-10-19 NOTE — Progress Notes (Deleted)
FOLLOW UP  Assessment and Plan:   Hypertension Well controlled with current medications  Monitor blood pressure at home; patient to call if consistently greater than 130/80 Continue DASH diet.   Reminder to go to the ER if any CP, SOB, nausea, dizziness, severe HA, changes vision/speech, left arm numbness and tingling and jaw pain.  Cholesterol Currently above goal; *** Continue low cholesterol diet and exercise.  Check lipid panel.   Diabetes with diabetic chronic kidney disease Continue medication: metformin Continue diet and exercise.  Perform daily foot/skin check, notify office of any concerning changes.  Check A1C  Obesity with co morbidities Long discussion about weight loss, diet, and exercise Recommended diet heavy in fruits and veggies and low in animal meats, cheeses, and dairy products, appropriate calorie intake Discussed ideal weight for height (below ***) and initial weight goal (***) Patient on phentermine with benefit and no SE, taking drug breaks; continue close follow up.  Patient will work on *** Will follow up in 3 months  Vitamin D Def At goal at last visit; continue supplementation to maintain goal of 70-100 Defer Vit D level  Continue diet and meds as discussed. Further disposition pending results of labs. Discussed med's effects and SE's.   Over 30 minutes of exam, counseling, chart review, and critical decision making was performed.   Future Appointments  Date Time Provider [place]  10/20/2017 11:15 AM Liane Comber, NP [facility] None  01/25/2018 10:30 AM Unk Pinto, MD [facility] None  08/06/2018 11:00 AM Unk Pinto, MD [facility] None    ----------------------------------------------------------------------------------------------------------------------  HPI 62 y.o. male  presents for 3 month follow up on hypertension, cholesterol, diabetes, morbid obesity and vitamin D deficiency.  He recently experienced myalgias  with dose increase of crestor and was advised to hold for 3 weeks and restart at previous dose.   he is prescribed phentermine for weight loss.  While on the medication they have lost {NUMBERS 0-12:18577} lbs since last visit. They deny palpitations, anxiety, trouble sleeping, elevated BP.   BMI is There is no height or weight on file to calculate BMI., he is working on diet and exercise. Wt Readings from Last 3 Encounters:  10/11/17 294 lb (133.4 kg)  08/17/17 289 lb 9.6 oz (131.4 kg)  07/17/17 287 lb 12.8 oz (130.5 kg)   Typical breakfast: Typical lunch:  Typical dinner: Exercise:  Water intake:   His blood pressure {HAS HAS NOT:18834} been controlled at home, today their BP is    He {DOES_DOES VEH:20947} workout. He denies chest pain, shortness of breath, dizziness.   He {ACTION; IS/IS SJG:[id number]} on cholesterol medication ( crestor *** ) and denies myalgias. His cholesterol is not at goal. The cholesterol last visit was:   Lab Results  Component Value Date   CHOL 241 (H) 07/17/2017   HDL 53 07/17/2017   LDLCALC 113 (H) 01/08/2017   TRIG 145 07/17/2017   CHOLHDL 4.5 07/17/2017    He {Has/has not:18111} been working on diet and exercise for T2 diabetes, and denies {Symptoms; diabetes w/o none:19199}. Last A1C in the office was:  Lab Results  Component Value Date   HGBA1C 6.3 (H) 07/17/2017   Patient is on Vitamin D supplement and near goal at last check:    Lab Results  Component Value Date   VD25OH 62 07/17/2017        Current Medications:  Current Outpatient Medications on File Prior to Visit  Medication Sig  . Ascorbic Acid (VITA-C PO) Take by mouth.  Marland Kitchen  aspirin EC 81 MG tablet Take 1 tablet (81 mg total) by mouth daily.  . Blood Glucose Monitoring Suppl (ACCU-CHEK GUIDE) w/Device KIT 1 kit by Does not apply route daily. Check blood sugar 1 time a day-DX-E11.22  . CARTIA XT 120 MG 24 hr capsule TAKE 1 CAPSULE EVERY DAY FOR BLOOD PRESSURE  . Cholecalciferol  (VITAMIN D-3) 5000 UNITS TABS Take 1 tablet by mouth daily.  . gabapentin (NEURONTIN) 100 MG capsule Take 1 tablet 3 x/day for neuropathy pains in hands  . glucose blood (ACCU-CHEK GUIDE) test strip Check blood sugar 1 time a day-DX-E11.22  . losartan (COZAAR) 100 MG tablet TAKE 1 TABLET EVERY DAY FOR BLOOD PRESSURE  . Magnesium-Zinc (MAGNESIUM-CHELATED ZINC PO) Take 400 mg by mouth daily. Takes 1200 mg daily.  . metFORMIN (GLUCOPHAGE-XR) 500 MG 24 hr tablet TAKE 2 TABLETS TWICE DAILY  FOR  DIABETES  . Multiple Vitamins-Minerals (THERAGRAN-M PREMIER 50 PLUS PO) Take 1 tablet by mouth daily.  . Omega-3 Fatty Acids (FISH OIL) 500 MG CAPS Take by mouth daily.  . phentermine (ADIPEX-P) 37.5 MG tablet TAKE 1/2-1 TABLET BY MOUTH EVERY MORNING  . rosuvastatin (CRESTOR) 40 MG tablet Take 1/2 to 1 tablet daily or as directed for Cholesterol (Patient not taking: Reported on 07/17/2017)  . sildenafil (REVATIO) 20 MG tablet TAKE 2-5 TABLETS BY MOUTH DAILY AS NEEDED   No current facility-administered medications on file prior to visit.      Allergies: No Known Allergies   Medical History:  Past Medical History:  Diagnosis Date  . Vitamin D deficiency    Family history- Reviewed and unchanged Social history- Reviewed and unchanged   Review of Systems:  ROS    Physical Exam: There were no vitals taken for this visit. Wt Readings from Last 3 Encounters:  10/11/17 294 lb (133.4 kg)  08/17/17 289 lb 9.6 oz (131.4 kg)  07/17/17 287 lb 12.8 oz (130.5 kg)   General Appearance: Well nourished, in no apparent distress. Eyes: PERRLA, EOMs, conjunctiva no swelling or erythema Sinuses: No Frontal/maxillary tenderness ENT/Mouth: Ext aud canals clear, TMs without erythema, bulging. No erythema, swelling, or exudate on post pharynx.  Tonsils not swollen or erythematous. Hearing normal.  Neck: Supple, thyroid normal.  Respiratory: Respiratory effort normal, BS equal bilaterally without rales, rhonchi,  wheezing or stridor.  Cardio: RRR with no MRGs. Brisk peripheral pulses without edema.  Abdomen: Soft, + BS.  Non tender, no guarding, rebound, hernias, masses. Lymphatics: Non tender without lymphadenopathy.  Musculoskeletal: Full ROM, 5/5 strength, {PSY - GAIT AND STATION:22860} gait Skin: Warm, dry without rashes, lesions, ecchymosis.  Neuro: Cranial nerves intact. No cerebellar symptoms.  Psych: Awake and oriented X 3, normal affect, Insight and Judgment appropriate.     C , NP 8:35 AM [facility]  

## 2017-10-20 ENCOUNTER — Ambulatory Visit: Payer: Self-pay | Admitting: Adult Health

## 2017-10-25 NOTE — Progress Notes (Signed)
FOLLOW UP  Assessment and Plan:   Hypertension Well controlled with current medications  Monitor blood pressure at home; patient to call if consistently greater than 130/80 Continue DASH diet.   Reminder to go to the ER if any CP, SOB, nausea, dizziness, severe HA, changes vision/speech, left arm numbness and tingling and jaw pain.  Cholesterol Currently above goal; currently off of crestor - discussed may restart at previous dose Continue low cholesterol diet and exercise.  Check lipid panel.   Diabetes with diabetic chronic kidney disease and neuropathy and circulatory complications Continue medication: metformin Continue diet and exercise.  Perform daily foot/skin check, notify office of any concerning changes.  Check A1C  Obesity with co morbidities Long discussion about weight loss, diet, and exercise - plans to restart efforts soon Recommended diet heavy in fruits and veggies and low in animal meats, cheeses, and dairy products, appropriate calorie intake Discussed ideal weight for height  Patient on phentermine with benefit and no SE, taking drug breaks; continue close follow up.  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 Further disposition pending results of labs. Discussed med's effects and SE's.   Over 30 minutes of exam, counseling, chart review, and critical decision making was performed.   Future Appointments  Date Time Provider [place]  01/25/2018 10:30 AM Unk Pinto, MD [facility] None  08/06/2018 11:00 AM Unk Pinto, MD [facility] None    ----------------------------------------------------------------------------------------------------------------------  HPI 62 y.o. male  presents for 3 month follow up on  hypertension, cholesterol, diabetes, morbid obesity and vitamin D deficiency.   He recently experienced myalgias with dose increase of crestor (20 mg three times a week to daily) and was advised to hold for 3 weeks and restart at previous dose. He reports myalgias resolves quickly but he has not restarted crestor - he would like to check cholesterol levels before restarting.   he is prescribed phentermine for weight loss. While on the medication they have lost 1 lbs since last visit. They deny palpitations, anxiety, trouble sleeping, elevated BP.   BMI is Body mass index is 43.27 kg/m., he is working on diet and exercise. Wt Readings from Last 3 Encounters:  10/26/17 293 lb (132.9 kg)  10/11/17 294 lb (133.4 kg)  08/17/17 289 lb 9.6 oz (131.4 kg)   Typical breakfast: Link sausages, will restart oatmeal Typical lunch: has been skipping, or has a salad Typical dinner: Varies, boiled chicken with whole grain rice, salad, might have [place]  Exercise: Plans to restart going to the GYM, rides bike to gym as well  Water intake: 6-7 bottles, does have "diabetic tea" occasionally.   His blood pressure has been controlled at home, today their BP is BP: 126/78  He does workout. He denies chest pain, shortness of breath, dizziness.   He is not on cholesterol medication (has stopped recently due to myalgias) and denies myalgias. His cholesterol is not at goal. The cholesterol last visit was:   Lab Results  Component Value Date   CHOL 241 (H) 07/17/2017   HDL 53 07/17/2017   LDLCALC 113 (H) 01/08/2017   TRIG 145 07/17/2017   CHOLHDL 4.5 07/17/2017    He has been working on diet and exercise for T2 diabetes, and denies foot ulcerations, hyperglycemia, hypoglycemia , increased appetite,  nausea, paresthesia of the feet, polydipsia, polyuria, visual disturbances, vomiting and weight loss. Does check fasting sugar - runs 110-130s. Last A1C in the office was:  Lab Results  Component Value Date   HGBA1C  6.3 (H) 07/17/2017   Patient is on Vitamin D supplement and near goal at last check:    Lab Results  Component Value Date   VD25OH 62 07/17/2017       Current Medications:  Current Outpatient Medications on File Prior to Visit  Medication Sig  . Ascorbic Acid (VITA-C PO) Take by mouth.  Marland Kitchen aspirin EC 81 MG tablet Take 1 tablet (81 mg total) by mouth daily.  . Blood Glucose Monitoring Suppl (ACCU-CHEK GUIDE) w/Device KIT 1 kit by Does not apply route daily. Check blood sugar 1 time a day-DX-E11.22  . CARTIA XT 120 MG 24 hr capsule TAKE 1 CAPSULE EVERY DAY FOR BLOOD PRESSURE  . Cholecalciferol (VITAMIN D-3) 5000 UNITS TABS Take 1 tablet by mouth daily.  Marland Kitchen gabapentin (NEURONTIN) 100 MG capsule Take 1 tablet 3 x/day for neuropathy pains in hands  . glucose blood (ACCU-CHEK GUIDE) test strip Check blood sugar 1 time a day-DX-E11.22  . losartan (COZAAR) 100 MG tablet TAKE 1 TABLET EVERY DAY FOR BLOOD PRESSURE  . Magnesium-Zinc (MAGNESIUM-CHELATED ZINC PO) Take 400 mg by mouth daily. Takes 1200 mg daily.  . metFORMIN (GLUCOPHAGE-XR) 500 MG 24 hr tablet TAKE 2 TABLETS TWICE DAILY  FOR  DIABETES  . Multiple Vitamins-Minerals (THERAGRAN-M PREMIER 50 PLUS PO) Take 1 tablet by mouth daily.  . Omega-3 Fatty Acids (FISH OIL) 500 MG CAPS Take by mouth daily.  . phentermine (ADIPEX-P) 37.5 MG tablet TAKE 1/2-1 TABLET BY MOUTH EVERY MORNING  . sildenafil (REVATIO) 20 MG tablet TAKE 2-5 TABLETS BY MOUTH DAILY AS NEEDED  . rosuvastatin (CRESTOR) 40 MG tablet Take 1/2 to 1 tablet daily or as directed for Cholesterol (Patient not taking: Reported on 07/17/2017)   No current facility-administered medications on file prior to visit.      Allergies: No Known Allergies   Medical History:  Past Medical History:  Diagnosis Date  . Vitamin D deficiency    Family history- Reviewed and unchanged Social history- Reviewed and unchanged   Review of Systems:  Review of Systems  Constitutional: Negative for  malaise/fatigue and weight loss.  HENT: Negative for hearing loss and tinnitus.   Eyes: Negative for blurred vision and double vision.  Respiratory: Negative for cough, shortness of breath and wheezing.   Cardiovascular: Negative for chest pain, palpitations, orthopnea, claudication and leg swelling.  Gastrointestinal: Negative for abdominal pain, blood in stool, constipation, diarrhea, heartburn, melena, nausea and vomiting.  Genitourinary: Negative.   Musculoskeletal: Negative for joint pain and myalgias.  Skin: Negative for rash.  Neurological: Negative for dizziness, tingling, sensory change, weakness and headaches.  Endo/Heme/Allergies: Negative for polydipsia.  Psychiatric/Behavioral: Negative.   All other systems reviewed and are negative.     Physical Exam: BP 126/78   Pulse 86   Temp 97.7 F (36.5 C)   Ht _0  (1.753 m)   Wt 293 lb (132.9 kg)   SpO2 95%   BMI 43.27 kg/m  Wt Readings from Last 3 Encounters:  10/26/17 293 lb (132.9 kg)  10/11/17 294 lb (133.4 kg)  08/17/17 289 lb 9.6 oz (131.4 kg)   General Appearance: Well nourished, morbidly obese, in no apparent distress. Eyes: PERRLA, EOMs, conjunctiva no swelling or erythema Sinuses: No Frontal/maxillary tenderness ENT/Mouth: Ext aud canals clear, TMs  without erythema, bulging. No erythema, swelling, or exudate on post pharynx.  Tonsils not swollen or erythematous. Hearing normal.  Neck: Supple, thyroid normal.  Respiratory: Respiratory effort normal, BS equal bilaterally without rales, rhonchi, wheezing or stridor.  Cardio: RRR with no MRGs. Brisk peripheral pulses without edema.  Abdomen: Soft, + BS.  Non tender, no guarding, rebound, hernias, masses. Lymphatics: Non tender without lymphadenopathy.  Musculoskeletal: Full ROM, 5/5 strength, Normal gait Skin: Warm, dry without rashes, lesions, ecchymosis.  Neuro: Cranial nerves intact. No cerebellar symptoms.  Psych: Awake and oriented X 3, normal affect,  Insight and Judgment appropriate.    [name], NP 12:07 PM [facility]

## 2017-10-26 ENCOUNTER — Encounter: Payer: Self-pay | Admitting: Adult Health

## 2017-10-26 ENCOUNTER — Ambulatory Visit (INDEPENDENT_AMBULATORY_CARE_PROVIDER_SITE_OTHER): Payer: Medicare HMO | Admitting: Adult Health

## 2017-10-26 VITALS — BP 126/78 | HR 86 | Temp 97.7°F | Ht 69.0 in | Wt 293.0 lb

## 2017-10-26 DIAGNOSIS — Z79899 Other long term (current) drug therapy: Secondary | ICD-10-CM

## 2017-10-26 DIAGNOSIS — E782 Mixed hyperlipidemia: Secondary | ICD-10-CM | POA: Diagnosis not present

## 2017-10-26 DIAGNOSIS — E559 Vitamin D deficiency, unspecified: Secondary | ICD-10-CM | POA: Diagnosis not present

## 2017-10-26 DIAGNOSIS — E1122 Type 2 diabetes mellitus with diabetic chronic kidney disease: Secondary | ICD-10-CM | POA: Diagnosis not present

## 2017-10-26 DIAGNOSIS — N182 Chronic kidney disease, stage 2 (mild): Secondary | ICD-10-CM | POA: Diagnosis not present

## 2017-10-26 DIAGNOSIS — E1142 Type 2 diabetes mellitus with diabetic polyneuropathy: Secondary | ICD-10-CM | POA: Diagnosis not present

## 2017-10-26 DIAGNOSIS — I10 Essential (primary) hypertension: Secondary | ICD-10-CM | POA: Diagnosis not present

## 2017-10-26 NOTE — Patient Instructions (Addendum)
Consider adding a fiber supplement - citrucel or benefiber  Cinnamon supplement 1000 mg twice a day - helps with sugars  Switch to whole oats    Fat and Cholesterol Restricted Diet Getting too much fat and cholesterol in your diet may cause health problems. Following this diet helps keep your fat and cholesterol at normal levels. This can keep you from getting sick. What types of fat should I choose?  Choose monosaturated and polyunsaturated fats. These are found in foods such as olive oil, canola oil, flaxseeds, walnuts, almonds, and seeds.  Eat more omega-3 fats. Good choices include salmon, mackerel, sardines, tuna, flaxseed oil, and ground flaxseeds.  Limit saturated fats. These are in animal products such as meats, butter, and cream. They can also be in plant products such as palm oil, palm kernel oil, and coconut oil.  Avoid foods with partially hydrogenated oils in them. These contain trans fats. Examples of foods that have trans fats are stick margarine, some tub margarines, cookies, crackers, and other baked goods. What general guidelines do I need to follow?  Check food labels. Look for the words "trans fat" and "saturated fat."  When preparing a meal: ? Fill half of your plate with vegetables and green salads. ? Fill one fourth of your plate with whole grains. Look for the word "whole" as the first word in the ingredient list. ? Fill one fourth of your plate with lean protein foods.  Eat more foods that have fiber, like apples, carrots, beans, peas, and barley.  Eat more home-cooked foods. Eat less at restaurants and buffets.  Limit or avoid alcohol.  Limit foods high in starch and sugar.  Limit fried foods.  Cook foods without frying them. Baking, boiling, grilling, and broiling are all great options.  Lose weight if you are overweight. Losing even a small amount of weight can help your overall health. It can also help prevent diseases such as diabetes and heart  disease. What foods can I eat? Grains Whole grains, such as whole wheat or whole grain breads, crackers, cereals, and pasta. Unsweetened oatmeal, bulgur, barley, quinoa, or brown rice. Corn or whole wheat flour tortillas. Vegetables Fresh or frozen vegetables (raw, steamed, roasted, or grilled). Green salads. Fruits All fresh, canned (in natural juice), or frozen fruits. Meat and Other Protein Products Ground beef (85% or leaner), grass-fed beef, or beef trimmed of fat. Skinless chicken or [place]. Ground chicken or [place]. Pork trimmed of fat. All fish and seafood. Eggs. Dried beans, peas, or lentils. Unsalted nuts or seeds. Unsalted canned or dry beans. Dairy Low-fat dairy products, such as skim or 1% milk, 2% or reduced-fat cheeses, low-fat ricotta or cottage cheese, or plain low-fat yogurt. Fats and Oils Tub margarines without trans fats. Light or reduced-fat mayonnaise and salad dressings. Avocado. Olive, canola, sesame, or safflower oils. Natural peanut or almond butter (choose ones without added sugar and oil). The items listed above may not be a complete list of recommended foods or beverages. Contact your dietitian for more options. What foods are not recommended? Grains White bread. White pasta. White rice. Cornbread. Bagels, pastries, and croissants. Crackers that contain trans fat. Vegetables White potatoes. Corn. Creamed or fried vegetables. Vegetables in a cheese sauce. Fruits Dried fruits. Canned fruit in light or heavy syrup. Fruit juice. Meat and Other Protein Products Fatty cuts of meat. Ribs, chicken wings, bacon, sausage, bologna, salami, chitterlings, fatback, hot dogs, bratwurst, and packaged luncheon meats. Liver and organ meats. Dairy Whole or 2% milk, cream,  half-and-half, and cream cheese. Whole milk cheeses. Whole-fat or sweetened yogurt. Full-fat cheeses. Nondairy creamers and whipped toppings. Processed cheese, cheese spreads, or cheese curds. Sweets and  Desserts Corn syrup, sugars, honey, and molasses. Candy. Jam and jelly. Syrup. Sweetened cereals. Cookies, pies, cakes, donuts, muffins, and ice cream. Fats and Oils Butter, stick margarine, lard, shortening, ghee, or bacon fat. Coconut, palm kernel, or palm oils. Beverages Alcohol. Sweetened drinks (such as sodas, lemonade, and fruit drinks or punches). The items listed above may not be a complete list of foods and beverages to avoid. Contact your dietitian for more information. This information is not intended to replace advice given to you by your health care provider. Make sure you discuss any questions you have with your health care provider. Document Released: 02/03/2012 Document Revised: 04/10/2016 Document Reviewed: 11/03/2013 Elsevier Interactive Patient Education  [employer]2018 [employer]

## 2017-10-27 LAB — TSH: TSH: 2.36 mIU/L (ref 0.40–4.50)

## 2017-10-27 LAB — HEMOGLOBIN A1C
Hgb A1c MFr Bld: 6.8 % of total Hgb — ABNORMAL HIGH (ref <5.7)
Mean Plasma Glucose: 148 (calc)
eAG (mmol/L): 8.2 (calc)

## 2017-10-27 LAB — CBC WITH DIFFERENTIAL/PLATELET
Basophils Absolute: 50 cells/uL (ref 0–200)
Basophils Relative: 0.9 %
Eosinophils Absolute: 218 cells/uL (ref 15–500)
Eosinophils Relative: 3.9 %
HCT: 44.9 % (ref 38.5–50.0)
Hemoglobin: 14.6 g/dL (ref 13.2–17.1)
Lymphs Abs: 2223 cells/uL (ref 850–3900)
MCH: 26.1 pg — ABNORMAL LOW (ref 27.0–33.0)
MCHC: 32.5 g/dL (ref 32.0–36.0)
MCV: 80.3 fL (ref 80.0–100.0)
MPV: 9.7 fL (ref 7.5–12.5)
Monocytes Relative: 10.6 %
Neutro Abs: 2514 cells/uL (ref 1500–7800)
Neutrophils Relative %: 44.9 %
Platelets: 303 10*3/uL (ref 140–400)
RBC: 5.59 10*6/uL (ref 4.20–5.80)
RDW: 13.2 % (ref 11.0–15.0)
Total Lymphocyte: 39.7 %
WBC mixed population: 594 cells/uL (ref 200–950)
WBC: 5.6 10*3/uL (ref 3.8–10.8)

## 2017-10-27 LAB — LIPID PANEL
Cholesterol: 193 mg/dL (ref <200)
HDL: 47 mg/dL (ref >40)
LDL Cholesterol (Calc): 129 mg/dL (calc) — ABNORMAL HIGH
Non-HDL Cholesterol (Calc): 146 mg/dL (calc) — ABNORMAL HIGH (ref <130)
Total CHOL/HDL Ratio: 4.1 (calc) (ref <5.0)
Triglycerides: 75 mg/dL (ref <150)

## 2017-10-27 LAB — BASIC METABOLIC PANEL WITH GFR
BUN: 15 mg/dL (ref 7–25)
CO2: 26 mmol/L (ref 20–32)
Calcium: 10.1 mg/dL (ref 8.6–10.3)
Chloride: 102 mmol/L (ref 98–110)
Creat: 1.06 mg/dL (ref 0.70–1.25)
GFR, Est African American: 87 mL/min/{1.73_m2} (ref >=60)
GFR, Est Non African American: 75 mL/min/{1.73_m2} (ref >=60)
Glucose, Bld: 95 mg/dL (ref 65–99)
Potassium: 4 mmol/L (ref 3.5–5.3)
Sodium: 137 mmol/L (ref 135–146)

## 2017-10-27 LAB — HEPATIC FUNCTION PANEL
AG Ratio: 1.2 (calc) (ref 1.0–2.5)
ALT: 11 U/L (ref 9–46)
AST: 23 U/L (ref 10–35)
Albumin: 4.3 g/dL (ref 3.6–5.1)
Alkaline phosphatase (APISO): 65 U/L (ref 40–115)
Bilirubin, Direct: 0.1 mg/dL (ref 0.0–0.2)
Globulin: 3.6 g/dL (calc) (ref 1.9–3.7)
Indirect Bilirubin: 0.4 mg/dL (calc) (ref 0.2–1.2)
Total Bilirubin: 0.5 mg/dL (ref 0.2–1.2)
Total Protein: 7.9 g/dL (ref 6.1–8.1)

## 2017-12-14 ENCOUNTER — Other Ambulatory Visit: Payer: Self-pay | Admitting: Internal Medicine

## 2017-12-14 DIAGNOSIS — I10 Essential (primary) hypertension: Secondary | ICD-10-CM

## 2018-01-06 ENCOUNTER — Emergency Department (HOSPITAL_COMMUNITY): Payer: Medicare HMO

## 2018-01-06 ENCOUNTER — Encounter (HOSPITAL_COMMUNITY): Payer: Self-pay | Admitting: Emergency Medicine

## 2018-01-06 ENCOUNTER — Emergency Department (HOSPITAL_COMMUNITY)
Admission: EM | Admit: 2018-01-06 | Discharge: 2018-01-06 | Disposition: A | Discharge status: Home / Self Care | Payer: Medicare HMO | Attending: Emergency Medicine | Admitting: Emergency Medicine

## 2018-01-06 ENCOUNTER — Other Ambulatory Visit: Payer: Self-pay

## 2018-01-06 DIAGNOSIS — Y999 Unspecified external cause status: Secondary | ICD-10-CM | POA: Insufficient documentation

## 2018-01-06 DIAGNOSIS — Y929 Unspecified place or not applicable: Secondary | ICD-10-CM | POA: Insufficient documentation

## 2018-01-06 DIAGNOSIS — Z7984 Long term (current) use of oral hypoglycemic drugs: Secondary | ICD-10-CM | POA: Insufficient documentation

## 2018-01-06 DIAGNOSIS — M791 Myalgia, unspecified site: Secondary | ICD-10-CM | POA: Diagnosis not present

## 2018-01-06 DIAGNOSIS — S8392XA Sprain of unspecified site of left knee, initial encounter: Secondary | ICD-10-CM

## 2018-01-06 DIAGNOSIS — X500XXA Overexertion from strenuous movement or load, initial encounter: Secondary | ICD-10-CM | POA: Diagnosis not present

## 2018-01-06 DIAGNOSIS — I10 Essential (primary) hypertension: Secondary | ICD-10-CM | POA: Insufficient documentation

## 2018-01-06 DIAGNOSIS — Z79899 Other long term (current) drug therapy: Secondary | ICD-10-CM | POA: Diagnosis not present

## 2018-01-06 DIAGNOSIS — S8992XA Unspecified injury of left lower leg, initial encounter: Secondary | ICD-10-CM | POA: Diagnosis not present

## 2018-01-06 DIAGNOSIS — Z87891 Personal history of nicotine dependence: Secondary | ICD-10-CM | POA: Diagnosis not present

## 2018-01-06 DIAGNOSIS — Z7982 Long term (current) use of aspirin: Secondary | ICD-10-CM | POA: Insufficient documentation

## 2018-01-06 DIAGNOSIS — Y9389 Activity, other specified: Secondary | ICD-10-CM | POA: Diagnosis not present

## 2018-01-06 NOTE — ED Notes (Signed)
Patient transported to X-ray 

## 2018-01-06 NOTE — ED Provider Notes (Signed)
[facility] DEPT Provider Note   CSN: [id number] Arrival date & time: 01/06/18  1433     History   Chief Complaint Chief Complaint  Patient presents with  . Knee Pain    HPI [name] [name] is a 62 y.o. male.  HPI  [name] [name] is a 62 y.o. male presents to ED with complaint of left knee injury.  Patient states he was walking on the steps, states he missed a step and twisted his left knee, falling down to the ground.  He denies any other injuries other than left knee.  He did not hit his head or lost consciousness.  He does have a little bit of a pain to his left hand but no difficulty moving or using it.  He has not tried any medication other than taking his regular gabapentin which did not help.  He is able to ambulate and bear weight but states it is very painful.  He is also reports pain with knee flexion and extension.  No numbness or weakness distal to the injury.    Past Medical History:  Diagnosis Date  . Vitamin D deficiency     Patient Active Problem List   Diagnosis Date Noted  . Gluttony 07/19/2017  . Chronic lower back pain 04/16/2017  . Aneurysm artery, iliac common ([place]) 01/23/2015  . Chronic pain syndrome 01/23/2015  . Peripheral sensory neuropathy due to T2_NIDDM 01/23/2015  . Erectile dysfunction associated with type 2 diabetes mellitus ([place]) 01/23/2015  . T2_NIDDM w/ Stage 2 CKD (GFR 77  ml/min) 06/06/2014  . Medication management 06/06/2014  . Vitamin D deficiency   . HTN (hypertension) 09/29/2012  . Hyperlipidemia, mixed 09/29/2012  . GERD (gastroesophageal reflux disease) 09/29/2012  . Morbid obesity (BMI 40+) 09/29/2012    Past Surgical History:  Procedure Laterality Date  . CARPAL TUNNEL RELEASE  2008   bilateral  . CERVICAL DISCECTOMY  2003  . KNEE ARTHROSCOPY  2010   left        Home Medications    Prior to Admission medications   Medication Sig Start Date End Date Taking? Authorizing Provider    Ascorbic Acid (VITA-C PO) Take by mouth.    [provider]  aspirin EC 81 MG tablet Take 1 tablet (81 mg total) by mouth daily. 04/16/17 04/16/18  Vicie Mutters, PA-C  Blood Glucose Monitoring Suppl (ACCU-CHEK GUIDE) w/Device KIT 1 kit by Does not apply route daily. Check blood sugar 1 time a day-DX-E11.22 09/30/17   Unk Pinto, MD  CARTIA XT 120 MG 24 hr capsule TAKE 1 CAPSULE EVERY DAY FOR BLOOD PRESSURE 06/03/17   Unk Pinto, MD  Cholecalciferol (VITAMIN D-3) 5000 UNITS TABS Take 1 tablet by mouth daily.    [provider]  gabapentin (NEURONTIN) 100 MG capsule Take 1 tablet 3 x/day for neuropathy pains in hands 01/19/17 10/26/17  Unk Pinto, MD  glucose blood (ACCU-CHEK GUIDE) test strip Check blood sugar 1 time a day-DX-E11.22 09/30/17   Unk Pinto, MD  losartan (COZAAR) 100 MG tablet TAKE 1 TABLET EVERY DAY FOR BLOOD PRESSURE 12/15/17   Unk Pinto, MD  Magnesium-Zinc (MAGNESIUM-CHELATED ZINC PO) Take 400 mg by mouth daily. Takes 1200 mg daily.    [provider]  metFORMIN (GLUCOPHAGE-XR) 500 MG 24 hr tablet TAKE 2 TABLETS TWICE DAILY  FOR  DIABETES 05/18/17   Unk Pinto, MD  Multiple Vitamins-Minerals [facility] [place] PREMIER 50 PLUS PO) Take 1 tablet by mouth daily.    [provider]  Omega-3 Fatty Acids (FISH OIL) 500 MG CAPS Take by mouth daily.    [provider]  phentermine (ADIPEX-P) 37.5 MG tablet TAKE 1/2-1 TABLET BY MOUTH EVERY MORNING 09/11/17   Unk Pinto, MD  rosuvastatin (CRESTOR) 40 MG tablet Take 1/2 to 1 tablet daily or as directed for Cholesterol Patient not taking: Reported on 07/17/2017 01/14/17 07/17/17  Unk Pinto, MD  sildenafil (REVATIO) 20 MG tablet TAKE 2-5 TABLETS BY MOUTH DAILY AS NEEDED 09/12/17   Unk Pinto, MD    Family History Family History  Problem Relation Age of Onset  . Diabetes Father   . Hypertension Father   . Diabetes Sister   . Kidney disease Sister   .  Heart disease Mother   . Stroke Mother   . Diabetes Mother   . Colon cancer Neg Hx   . Stomach cancer Neg Hx     Social History Social History   Tobacco Use  . Smoking status: Former Smoker    Last attempt to quit: 02/25/1991    Years since quitting: 26.8  . Smokeless tobacco: Never Used  Substance Use Topics  . Alcohol use: No  . Drug use: No     Allergies   Patient has no known allergies.   Review of Systems Review of Systems  Constitutional: Negative for chills and fever.  Respiratory: Negative for cough, chest tightness and shortness of breath.   Cardiovascular: Negative for chest pain, palpitations and leg swelling.  Musculoskeletal: Positive for arthralgias and myalgias. Negative for neck pain and neck stiffness.  Skin: Negative for rash.  Allergic/Immunologic: Negative for immunocompromised state.  Neurological: Negative for weakness and numbness.  All other systems reviewed and are negative.    Physical Exam Updated Vital Signs BP 125/68 (BP Location: Right Arm)   Pulse (!) 102   Temp 99.2 F (37.3 C) (Oral)   Resp 18   Ht 5' 8" (1.727 m)   Wt 131.1 kg (289 lb)   SpO2 97%   BMI 43.94 kg/m   Physical Exam  Constitutional: He appears well-developed and well-nourished. No distress.  Eyes: Conjunctivae are normal.  Neck: Neck supple.  Cardiovascular: Normal rate.  Pulmonary/Chest: No respiratory distress.  Abdominal: He exhibits no distension.  Musculoskeletal:  No obvious swelling or deformity to the left knee.  Diffuse tenderness to palpation over anterior, lateral, medial, posterior joint.  Full extension, limited flexion due to pain and swelling.  Knee joint is stable with negative anterior and posterior drawer signs.  No laxity with medial lateral stress.  Dorsal pedal pulses intact.  Normal ankle and hip.  Skin: Skin is warm and dry.  Nursing note and vitals reviewed.    ED Treatments / Results  Labs (all labs ordered are listed, but only  abnormal results are displayed) Labs Reviewed - No data to display  EKG None  Radiology Dg Knee Complete 4 Views Left  Result Date: 01/06/2018 CLINICAL DATA:  Twisting injury left knee going down stairs yesterday. Initial encounter. EXAM: LEFT KNEE - COMPLETE 4+ VIEW COMPARISON:  MRI left knee 02/15/2009. FINDINGS: There is no acute bony or joint abnormality. The patient has degenerative disease about the knee appearing worst in the medial compartment. Mild spur off the superior pole of the patella at the quadriceps tendon insertion is noted. No focal bony lesion. No joint effusion. IMPRESSION: No acute abnormality. Osteoarthritis about the knee appears worst medially. Electronically Signed   By: Inge Rise M.D.   On: 01/06/2018  15:23    Procedures Procedures (including critical care time)  Medications Ordered in ED Medications - No data to display   Initial Impression / Assessment and Plan / ED Course  I have reviewed the triage vital signs and the nursing notes.  Pertinent labs & imaging results that were available during my care of the patient were reviewed by me and considered in my medical decision making (see chart for details).     Patient with twisting mechanism injury of the left knee and fall.  He is ambulatory, able to bear weight on that leg.  X-ray is negative other than osteoarthritis.  Joint is stable on my exam, however hard to assess due to body habitus.  Will treat for possible sprain, ice, elevation, extra strength Tylenol, I will give him a knee sleeve, will have him follow-up with orthopedics or family doctor if pain is not improving in 3 to 5 days.  Vitals:   01/06/18 1444  BP: 125/68  Pulse: (!) 102  Resp: 18  Temp: 99.2 F (37.3 C)  TempSrc: Oral  SpO2: 97%  Weight: 131.1 kg (289 lb)  Height: 5' 8" (1.727 m)     Final Clinical Impressions(s) / ED Diagnoses   Final diagnoses:  Sprain of left knee, unspecified ligament, initial encounter     ED Discharge Orders    None       Jeannett Senior, PA-C 01/06/18 [place], MD 01/06/18 [phone]

## 2018-01-06 NOTE — Discharge Instructions (Addendum)
Your x-ray of the knee showed arthritis.  Ice and elevate your knee at home.  Stay off of it is much as possible.  Take extra strength Tylenol for pain. Continue gabapentin. Knee sleeve for swelling and support. Follow up with family doctor if not improving in 3-5 days

## 2018-01-06 NOTE — ED Triage Notes (Signed)
Pt verbalizes twisted left knee yesterday going down stairs.

## 2018-01-25 ENCOUNTER — Other Ambulatory Visit: Payer: Self-pay | Admitting: Internal Medicine

## 2018-01-25 ENCOUNTER — Ambulatory Visit (INDEPENDENT_AMBULATORY_CARE_PROVIDER_SITE_OTHER): Payer: Medicare HMO | Admitting: Internal Medicine

## 2018-01-25 ENCOUNTER — Encounter: Payer: Self-pay | Admitting: Internal Medicine

## 2018-01-25 VITALS — BP 128/74 | HR 78 | Temp 97.8°F | Resp 16 | Ht 69.0 in | Wt 292.8 lb

## 2018-01-25 DIAGNOSIS — E1142 Type 2 diabetes mellitus with diabetic polyneuropathy: Secondary | ICD-10-CM

## 2018-01-25 DIAGNOSIS — I10 Essential (primary) hypertension: Secondary | ICD-10-CM

## 2018-01-25 DIAGNOSIS — E1122 Type 2 diabetes mellitus with diabetic chronic kidney disease: Secondary | ICD-10-CM

## 2018-01-25 DIAGNOSIS — Z79899 Other long term (current) drug therapy: Secondary | ICD-10-CM | POA: Diagnosis not present

## 2018-01-25 DIAGNOSIS — N182 Chronic kidney disease, stage 2 (mild): Secondary | ICD-10-CM | POA: Diagnosis not present

## 2018-01-25 DIAGNOSIS — E782 Mixed hyperlipidemia: Secondary | ICD-10-CM | POA: Diagnosis not present

## 2018-01-25 DIAGNOSIS — E559 Vitamin D deficiency, unspecified: Secondary | ICD-10-CM | POA: Diagnosis not present

## 2018-01-25 NOTE — Patient Instructions (Signed)

## 2018-01-25 NOTE — Progress Notes (Signed)
This very nice 62 y.o. single BM presents for 6 month follow up with HTN, HLD, Pre-Diabetes and Vitamin D Deficiency. Patient is on SS Disability from bilat Carpal tunnel syndrome. Patient also is on CPAP for OSA w/improved restorative Sleep.      Patient is treated for HTN (2007) & BP has been controlled at home. Today's BP is at goal - 128/74. Patient has had no complaints of any cardiac type chest pain, palpitations, dyspnea / orthopnea / PND, dizziness, claudication, or dependent edema.     Hyperlipidemia is not controlled with diet & meds. Patient denies myalgias or other med SE's. Last Lipids were not at goal: Lab Results  Component Value Date   CHOL 193 10/26/2017   HDL 47 10/26/2017   LDLCALC 129 (H) 10/26/2017   TRIG 75 10/26/2017   CHOLHDL 4.1 10/26/2017      Also, the patient has history of  Morbid Obesity (BMI 43+) and T2_NIDDM (2014) presenting in 2014 hospitalized with Non Ketotic Hyperosmolar Ketoacidosis and Hemiballismus (Glu >1000+) . Currently he denies symptoms of reactive hypoglycemia, diabetic polys, paresthesias or visual blurring.  Last A1c was not at goal: Lab Results  Component Value Date   HGBA1C 6.8 (H) 10/26/2017      Further, the patient also has history of Vitamin D Deficiency and supplements vitamin D without any suspected side-effects. Last vitamin D was at goal:  Lab Results  Component Value Date   VD25OH 62 07/17/2017   Current Outpatient Medications on File Prior to Visit  Medication Sig  . Ascorbic Acid (VITA-C PO) Take by mouth.  Marland Kitchen aspirin EC 81 MG tablet Take 1 tablet (81 mg total) by mouth daily.  . Blood Glucose Monitoring Suppl (ACCU-CHEK GUIDE) w/Device KIT 1 kit by Does not apply route daily. Check blood sugar 1 time a day-DX-E11.22  . CARTIA XT 120 MG 24 hr capsule TAKE 1 CAPSULE EVERY DAY FOR BLOOD PRESSURE  . Cholecalciferol (VITAMIN D-3) 5000 UNITS TABS Take 1 tablet by mouth daily.  Marland Kitchen glucose blood (ACCU-CHEK GUIDE) test strip  Check blood sugar 1 time a day-DX-E11.22  . losartan (COZAAR) 100 MG tablet TAKE 1 TABLET EVERY DAY FOR BLOOD PRESSURE  . Magnesium-Zinc (MAGNESIUM-CHELATED ZINC PO) Take 400 mg by mouth daily. Takes 1200 mg daily.  . metFORMIN (GLUCOPHAGE-XR) 500 MG 24 hr tablet TAKE 2 TABLETS TWICE DAILY  FOR  DIABETES  . Multiple Vitamins-Minerals (THERAGRAN-M PREMIER 50 PLUS PO) Take 1 tablet by mouth daily.  . Omega-3 Fatty Acids (FISH OIL) 500 MG CAPS Take by mouth daily.  . phentermine (ADIPEX-P) 37.5 MG tablet TAKE 1/2-1 TABLET BY MOUTH EVERY MORNING  . sildenafil (REVATIO) 20 MG tablet TAKE 2-5 TABLETS BY MOUTH DAILY AS NEEDED  . gabapentin (NEURONTIN) 100 MG capsule Take 1 tablet 3 x/day for neuropathy pains in hands   No current facility-administered medications on file prior to visit.    No Known Allergies   PMHx:   Past Medical History:  Diagnosis Date  . Vitamin D deficiency    Immunization History  Administered Date(s) Administered  . Pneumococcal-Unspecified 02/16/2013  . Tdap 02/10/2011   Past Surgical History:  Procedure Laterality Date  . CARPAL TUNNEL RELEASE  2008   bilateral  . CERVICAL DISCECTOMY  2003  . KNEE ARTHROSCOPY  2010   left   FHx:    Reviewed / unchanged  SHx:    Reviewed / unchanged  Systems Review:  Constitutional: Denies fever, chills, wt changes,  headaches, insomnia, fatigue, night sweats, change in appetite. Eyes: Denies redness, blurred vision, diplopia, discharge, itchy, watery eyes.  ENT: Denies discharge, congestion, post nasal drip, epistaxis, sore throat, earache, hearing loss, dental pain, tinnitus, vertigo, sinus pain, snoring.  CV: Denies chest pain, palpitations, irregular heartbeat, syncope, dyspnea, diaphoresis, orthopnea, PND, claudication or edema. Respiratory: denies cough, dyspnea, DOE, pleurisy, hoarseness, laryngitis, wheezing.  Gastrointestinal: Denies dysphagia, odynophagia, heartburn, reflux, water brash, abdominal pain or cramps,  nausea, vomiting, bloating, diarrhea, constipation, hematemesis, melena, hematochezia  or hemorrhoids. Genitourinary: Denies dysuria, frequency, urgency, nocturia, hesitancy, discharge, hematuria or flank pain. Musculoskeletal: Denies arthralgias, myalgias, stiffness, jt. swelling, pain, limping or strain/sprain.  Skin: Denies pruritus, rash, hives, warts, acne, eczema or change in skin lesion(s). Neuro: No weakness, tremor, incoordination, spasms or pain., but has occas numb paresthesias of the distal LE's and soles. Psychiatric: Denies confusion, memory loss or sensory loss. Endo: Denies change in weight, skin or hair change.  Heme/Lymph: No excessive bleeding, bruising or enlarged lymph nodes.  Physical Exam  BP 128/74   Pulse 78   Temp 97.8 F (36.6 C)   Resp 16   Ht 5' 9" (1.753 m)   Wt 292 lb 12.8 oz (132.8 kg)   SpO2 96%   BMI 43.24 kg/m   Appears  Over  nourished, well groomed  and in no distress.  Eyes: PERRLA, EOMs, conjunctiva no swelling or erythema. Sinuses: No frontal/maxillary tenderness ENT/Mouth: EAC's clear, TM's nl w/o erythema, bulging. Nares clear w/o erythema, swelling, exudates. Oropharynx clear without erythema or exudates. Oral hygiene is good. Tongue normal, non obstructing. Hearing intact.  Neck: Supple. Thyroid not palpable. Car 2+/2+ without bruits, nodes or JVD. Chest: Respirations nl with BS clear & equal w/o rales, rhonchi, wheezing or stridor.  Cor: Heart sounds normal w/ regular rate and rhythm without sig. murmurs, gallops, clicks or rubs. Peripheral pulses normal and equal  without edema.  Abdomen: Soft & bowel sounds normal. Non-tender w/o guarding, rebound, hernias, masses or organomegaly.  Lymphatics: Unremarkable.  Musculoskeletal: Full ROM all peripheral extremities, joint stability, 5/5 strength and normal gait.  Skin: Warm, dry without exposed rashes, lesions or ecchymosis apparent.  Neuro: Cranial nerves intact, reflexes equal bilaterally.  Sensory-motor testing grossly intact. Tendon reflexes grossly intact.  Pysch: Alert & oriented x 3.  Insight and judgement nl & appropriate. No ideations.  Assessment and Plan:  1. Essential hypertension  - Continue medication, monitor blood pressure at home.  - Continue DASH diet.  Reminder to go to the ER if any CP,  SOB, nausea, dizziness, severe HA, changes vision/speech.  - CBC with Differential/Platelet - COMPLETE METABOLIC PANEL WITH GFR - Magnesium - TSH  2. Hyperlipidemia, mixed  - Continue diet/meds, exercise,& lifestyle modifications.  - Continue monitor periodic cholesterol/liver & renal functions   - Lipid panel - TSH  3. Type 2 diabetes mellitus with stage 2 chronic kidney disease, without long-term current use of insulin (HCC)  - Hemoglobin A1c - Insulin, random  4. Vitamin D deficiency  - Continue diet, exercise, lifestyle modifications.  - Monitor appropriate labs.  - Continue supplementation.  - VITAMIN D 25 Hydroxyl  5. Peripheral sensory neuropathy due to T2_NIDDM   6. Medication management  - CBC with Differential/Platelet - COMPLETE METABOLIC PANEL WITH GFR - Magnesium - Lipid panel - TSH - Hemoglobin A1c - Insulin, random - VITAMIN D 25 Hydroxyl          Discussed  regular exercise, BP monitoring, weight control to achieve/maintain BMI   less than 25 and discussed med and SE's. Recommended labs to assess and monitor clinical status with further disposition pending results of labs. Over 30 minutes of exam, counseling, chart review was performed.  

## 2018-01-26 ENCOUNTER — Other Ambulatory Visit: Payer: Self-pay | Admitting: Internal Medicine

## 2018-01-26 DIAGNOSIS — E782 Mixed hyperlipidemia: Secondary | ICD-10-CM

## 2018-01-26 LAB — LIPID PANEL
Cholesterol: 240 mg/dL — ABNORMAL HIGH (ref <200)
HDL: 55 mg/dL (ref >40)
LDL Cholesterol (Calc): 161 mg/dL (calc) — ABNORMAL HIGH
Non-HDL Cholesterol (Calc): 185 mg/dL (calc) — ABNORMAL HIGH (ref <130)
Total CHOL/HDL Ratio: 4.4 (calc) (ref <5.0)
Triglycerides: 118 mg/dL (ref <150)

## 2018-01-26 LAB — CBC WITH DIFFERENTIAL/PLATELET
Basophils Absolute: 41 cells/uL (ref 0–200)
Basophils Relative: 0.8 %
Eosinophils Absolute: 158 cells/uL (ref 15–500)
Eosinophils Relative: 3.1 %
HCT: 41.9 % (ref 38.5–50.0)
Hemoglobin: 14 g/dL (ref 13.2–17.1)
Lymphs Abs: 2183 cells/uL (ref 850–3900)
MCH: 27.1 pg (ref 27.0–33.0)
MCHC: 33.4 g/dL (ref 32.0–36.0)
MCV: 81.2 fL (ref 80.0–100.0)
MPV: 9.8 fL (ref 7.5–12.5)
Monocytes Relative: 10.8 %
Neutro Abs: 2168 cells/uL (ref 1500–7800)
Neutrophils Relative %: 42.5 %
Platelets: 330 10*3/uL (ref 140–400)
RBC: 5.16 10*6/uL (ref 4.20–5.80)
RDW: 12.8 % (ref 11.0–15.0)
Total Lymphocyte: 42.8 %
WBC mixed population: 551 cells/uL (ref 200–950)
WBC: 5.1 10*3/uL (ref 3.8–10.8)

## 2018-01-26 LAB — COMPLETE METABOLIC PANEL WITH GFR
AG Ratio: 1.2 (calc) (ref 1.0–2.5)
ALT: 9 U/L (ref 9–46)
AST: 18 U/L (ref 10–35)
Albumin: 4.2 g/dL (ref 3.6–5.1)
Alkaline phosphatase (APISO): 71 U/L (ref 40–115)
BUN: 21 mg/dL (ref 7–25)
CO2: 27 mmol/L (ref 20–32)
Calcium: 9.5 mg/dL (ref 8.6–10.3)
Chloride: 104 mmol/L (ref 98–110)
Creat: 0.97 mg/dL (ref 0.70–1.25)
GFR, Est African American: 97 mL/min/{1.73_m2} (ref >=60)
GFR, Est Non African American: 83 mL/min/{1.73_m2} (ref >=60)
Globulin: 3.5 g/dL (calc) (ref 1.9–3.7)
Glucose, Bld: 132 mg/dL — ABNORMAL HIGH (ref 65–99)
Potassium: 4.3 mmol/L (ref 3.5–5.3)
Sodium: 139 mmol/L (ref 135–146)
Total Bilirubin: 0.3 mg/dL (ref 0.2–1.2)
Total Protein: 7.7 g/dL (ref 6.1–8.1)

## 2018-01-26 LAB — INSULIN, RANDOM: Insulin: 54.1 u[IU]/mL — ABNORMAL HIGH (ref 2.0–19.6)

## 2018-01-26 LAB — VITAMIN D 25 HYDROXY (VIT D DEFICIENCY, FRACTURES): Vit D, 25-Hydroxy: 61 ng/mL (ref 30–100)

## 2018-01-26 LAB — TSH: TSH: 2.16 mIU/L (ref 0.40–4.50)

## 2018-01-26 LAB — HEMOGLOBIN A1C
Hgb A1c MFr Bld: 6.2 % of total Hgb — ABNORMAL HIGH (ref <5.7)
Mean Plasma Glucose: 131 (calc)
eAG (mmol/L): 7.3 (calc)

## 2018-01-26 LAB — MAGNESIUM: Magnesium: 2 mg/dL (ref 1.5–2.5)

## 2018-01-26 MED ORDER — ROSUVASTATIN CALCIUM 40 MG PO TABS: ORAL_TABLET | ORAL | 1 refills | Status: DC

## 2018-02-02 ENCOUNTER — Other Ambulatory Visit: Payer: Self-pay | Admitting: Internal Medicine

## 2018-03-30 ENCOUNTER — Encounter (HOSPITAL_COMMUNITY): Payer: Self-pay

## 2018-03-30 ENCOUNTER — Emergency Department (HOSPITAL_COMMUNITY): Payer: Medicare HMO

## 2018-03-30 ENCOUNTER — Emergency Department (HOSPITAL_COMMUNITY)
Admission: EM | Admit: 2018-03-30 | Discharge: 2018-03-30 | Disposition: A | Discharge status: Home / Self Care | Payer: Medicare HMO | Attending: Emergency Medicine | Admitting: Emergency Medicine

## 2018-03-30 ENCOUNTER — Other Ambulatory Visit: Payer: Self-pay

## 2018-03-30 DIAGNOSIS — Z79899 Other long term (current) drug therapy: Secondary | ICD-10-CM | POA: Insufficient documentation

## 2018-03-30 DIAGNOSIS — M7989 Other specified soft tissue disorders: Secondary | ICD-10-CM | POA: Diagnosis not present

## 2018-03-30 DIAGNOSIS — Z87891 Personal history of nicotine dependence: Secondary | ICD-10-CM | POA: Insufficient documentation

## 2018-03-30 DIAGNOSIS — Z7982 Long term (current) use of aspirin: Secondary | ICD-10-CM | POA: Diagnosis not present

## 2018-03-30 DIAGNOSIS — E119 Type 2 diabetes mellitus without complications: Secondary | ICD-10-CM | POA: Diagnosis not present

## 2018-03-30 DIAGNOSIS — I10 Essential (primary) hypertension: Secondary | ICD-10-CM | POA: Diagnosis not present

## 2018-03-30 DIAGNOSIS — M25461 Effusion, right knee: Secondary | ICD-10-CM

## 2018-03-30 DIAGNOSIS — M25561 Pain in right knee: Secondary | ICD-10-CM | POA: Diagnosis not present

## 2018-03-30 DIAGNOSIS — Z7984 Long term (current) use of oral hypoglycemic drugs: Secondary | ICD-10-CM | POA: Insufficient documentation

## 2018-03-30 MED ORDER — PREDNISONE 10 MG PO TABS: 40.0000 mg | ORAL_TABLET | Freq: Every day | ORAL | 0 refills | Status: AC

## 2018-03-30 MED ORDER — DICLOFENAC SODIUM 1 % TD GEL: 2.0000 g | Freq: Four times a day (QID) | TRANSDERMAL | 0 refills | Status: DC

## 2018-03-30 NOTE — Discharge Instructions (Signed)
Continue taking home medications as prescribed. Take prednisone as prescribed.  Do not take other anti-inflammatories at the same time (Advil, Motrin, naproxen, ibuprofen, Aleve).  You may supplement with Tylenol as needed for further pain. Use Voltaren gel as needed for pain. Ice your knee for 20 minutes at a time to help with pain and swelling. Follow-up with your primary care doctor in 1 week if symptoms are not improving. Return to the emergency room if you develop numbness of your foot, you develop fevers/redness/worsening pain of your knee, or with any new or concerning symptoms
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
## 2018-03-30 NOTE — ED Triage Notes (Signed)
Patient states he stepped on a brick and tripped causing injury to his right knee 1 1/2 months ago. Patient states he was seen at that time and was given a knee sleeve to wear, but has not worn recently. patient states the pain and swelling to the right knee is worse.

## 2018-05-05 ENCOUNTER — Encounter: Payer: Self-pay | Admitting: Adult Health

## 2018-05-05 NOTE — Progress Notes (Signed)
MEDICARE ANNUAL WELLNESS VISIT AND FOLLOW UP Assessment:    Encounter for Medicare annual wellness exam 1 year  Essential hypertension Elevated - patient will start checking and home and call if remains above goal - continue medications, DASH diet, exercise and monitor at home. Call if greater than 130/80.  -     CBC with Differential/Platelet -     CMP/GFR -     TSH  Aneurysm artery, iliac common (HCC) Control blood pressure, cholesterol, glucose, increase exercise.   Type 2 diabetes mellitus with stage 2 chronic kidney disease, without long-term current use of insulin ([place]) Discussed general issues about diabetes pathophysiology and management., Educational material distributed., Suggested low cholesterol diet., Encouraged aerobic exercise., Discussed foot care., Reminded to get yearly retinal exam - will will have report sent -     CMP WITH GFR -     Hemoglobin A1c  Peripheral sensory neuropathy due to T2_NIDDM Discussed general issues about diabetes pathophysiology and management., Educational material distributed., Suggested low cholesterol diet., Encouraged aerobic exercise., Discussed foot care., Reminded to get yearly retinal exam. -     Hemoglobin A1c  Hyperlipidemia associated with T2DM ([place]) -continue medications, check lipids, decrease fatty foods, increase activity.  -     Lipid panel  Erectile dysfunction associated with type 2 diabetes mellitus ([place]) Discussed general issues about diabetes pathophysiology and management., Educational material distributed., Suggested low cholesterol diet., Encouraged aerobic exercise., Discussed foot care., Reminded to get yearly retinal exam.  Morbid obesity (BMI 40+) Long discussion about weight loss, diet, and exercise Discussed final goal weight (below 185lb) and current weight loss goal (290lb) Patient will restart phentermine - previouslywith benefit and no SE, taking drug breaks; continue close follow up. Return in 3  months  Medication management -     Magnesium  Vitamin D deficiency Continue supplement  Chronic pain syndrome/lower back pain Increase gabapentin  Gastroesophageal reflux disease, esophagitis presence not specified Well managed currently Discussed diet, avoiding triggers and other lifestyle changes  Right knee arthritis ? Superimposed injury due to recent fall, some reported intermittent laxity Continue support sleeve, NSAIDs Refer back to Dr. Jacqualyn Posey for possible injection   Over 30 minutes of exam, counseling, chart review, and critical decision making was performed  Future Appointments  Date Time Provider [place]  08/06/2018 11:00 AM Unk Pinto, MD [facility] None     Plan:   During the course of the visit the patient was educated and counseled about appropriate screening and preventive services including:    Pneumococcal vaccine   Influenza vaccine  Prevnar 13  Td vaccine  Screening electrocardiogram  Colorectal cancer screening  Diabetes screening  Glaucoma screening  Nutrition counseling    Subjective:  [name] [name] is a 62 y.o. AAM who presents for Medicare Annual Wellness Visit and 3 month follow up for HTN, hyperlipidemia, diabetes, and vitamin D Def.  He is taking care of his father, moved him in with him. He has retired from [employer] and is on disability for his bilateral hand numbness.    he is prescribed phentermine for weight loss but has been off for several months. They deny palpitations, anxiety, trouble sleeping, elevated BP. He does recall benefit when taking.   BMI is Body mass index is 45.04 kg/m., he is not currently working on diet and exercise, would like to get down to 185 lb.  Wt Readings from Last 3 Encounters:  05/06/18 (!) 305 lb (138.3 kg)  03/30/18 290 lb (131.5 kg)  01/25/18  292 lb 12.8 oz (132.8 kg)   He has not recently been checking BPs at home, today their BP is BP: 140/90 He does workout, cutting  grass, riding bike until recent kn  He denies chest pain, shortness of breath, dizziness.    He is not on cholesterol medication (rosuvastatin but had myalgias, tolerated simvastatin 40 mg daily previously) and denies myalgias. His cholesterol is not at goal. The cholesterol last visit was:   Lab Results  Component Value Date   CHOL 240 (H) 01/25/2018   HDL 55 01/25/2018   LDLCALC 161 (H) 01/25/2018   TRIG 118 01/25/2018   CHOLHDL 4.4 01/25/2018   He has been working on diet and exercise for Diabetes controlled by metformin with diabetic chronic kidney disease and with diabetic polyneuropathy, he is on gabapentin which is helping, he is on bASA, he is on ACE/ARB, and denies polydipsia, polyuria and visual disturbances. He does check fasting glucose, running 85-100 with rare elevations. Last A1C was:  Lab Results  Component Value Date   HGBA1C 6.2 (H) 01/25/2018   Last GFR Lab Results  Component Value Date   GFRAA 97 01/25/2018   Patient is on Vitamin D supplement.   Lab Results  Component Value Date   VD25OH 61 01/25/2018        Medication Review: Current Outpatient Medications on File Prior to Visit  Medication Sig Dispense Refill  . Ascorbic Acid (VITA-C PO) Take by mouth.    . Blood Glucose Monitoring Suppl (ACCU-CHEK GUIDE) w/Device KIT USE TO CHECK BLOOD SUGAR ONE TIME DAILY 1 kit 0  . CARTIA XT 120 MG 24 hr capsule TAKE 1 CAPSULE EVERY DAY FOR BLOOD PRESSURE 90 capsule 3  . Cholecalciferol (VITAMIN D-3) 5000 UNITS TABS Take 1 tablet by mouth daily.    . diclofenac sodium (VOLTAREN) 1 % GEL Apply 2 g topically 4 (four) times daily. 100 g 0  . gabapentin (NEURONTIN) 100 MG capsule TAKE 1 CAPSULE THREE TIMES DAILY  FOR  NEUROPATHY  PAINS IN HANDS 270 capsule 1  . glucose blood (ACCU-CHEK GUIDE) test strip Check blood sugar 1 time a day-[id number] 100 each 4  . losartan (COZAAR) 100 MG tablet TAKE 1 TABLET EVERY DAY FOR BLOOD PRESSURE 90 tablet 1  . Magnesium-Zinc  (MAGNESIUM-CHELATED ZINC PO) Take 400 mg by mouth daily. Takes 1200 mg daily.    . metFORMIN (GLUCOPHAGE-XR) 500 MG 24 hr tablet TAKE 2 TABLETS TWICE DAILY  FOR  DIABETES 360 tablet 0  . Multiple Vitamins-Minerals (THERAGRAN-M PREMIER 50 PLUS PO) Take 1 tablet by mouth daily.    . Omega-3 Fatty Acids (FISH OIL) 500 MG CAPS Take by mouth daily.    . phentermine (ADIPEX-P) 37.5 MG tablet TAKE 1/2-1 TABLET BY MOUTH EVERY MORNING 30 tablet 2  . rosuvastatin (CRESTOR) 40 MG tablet Take 1/2 to 1 tablet daily or as directed for Cholesterol 90 tablet 1  . sildenafil (REVATIO) 20 MG tablet TAKE 2-5 TABLETS BY MOUTH DAILY AS NEEDED 45 tablet 99   No current facility-administered medications on file prior to visit.     Allergies: Allergies  Allergen Reactions  . Crestor [Rosuvastatin Calcium] Other (See Comments)    Cramping    Current Problems (verified) has HTN (hypertension); Hyperlipidemia associated with type 2 diabetes mellitus ([place]); GERD (gastroesophageal reflux disease); Morbid obesity (BMI 40+); Vitamin D deficiency; T2_NIDDM w/ Stage 2 CKD (GFR 77  ml/min); Medication management; Aneurysm artery, iliac common ([place]); Chronic pain syndrome; Peripheral sensory neuropathy due  to T2_NIDDM; Erectile dysfunction associated with type 2 diabetes mellitus ([place]); Chronic lower back pain; Gluttony; and Arthritis of right knee on their problem list.  Screening Tests Immunization History  Administered Date(s) Administered  . Pneumococcal-Unspecified 02/16/2013  . Tdap 02/10/2011   Preventative care: Last colonoscopy: 2013, due 2023 Ct head 11/2014 US aorta 2015  Prior vaccinations: TD or Tdap: 2012  Influenza: Declined  Pneumococcal: 2014 Prevnar13: Declined until 65 Shingles/Zostavax: Declined  Names of Other Physician/Practitioners you currently use: 1. [facility] here for primary care 2. Dr. Herbert Deaner, eye doctor, last visit 03/2017 -  3. Does not  have a dentist- reminded to schedule  Patient Care Team: Unk Pinto, MD as PCP - General (Internal Medicine) Sable Feil, MD as Consulting Physician (Gastroenterology) Monna Fam, MD as Consulting Physician (Ophthalmology) Elsie Saas, MD as Consulting Physician (Orthopedic Surgery) Sypher, Herbie [place], MD (Inactive) as Consulting Physician (Orthopedic Surgery) Leeroy Cha, MD as Consulting Physician (Neurosurgery) Gean Birchwood, DPM as Consulting Physician (Podiatry)  Surgical: He  has a past surgical history that includes Carpal tunnel release (2008); Knee arthroscopy (2010); and Cervical discectomy (2003). Family His family history includes Diabetes in his father, mother, and sister; Heart disease in his mother; Hypertension in his father; Kidney disease in his sister; Stroke in his mother. Social history  He reports that he quit smoking about 27 years ago. He has never used smokeless tobacco. He reports that he drank alcohol. He reports that he does not use drugs.  MEDICARE WELLNESS OBJECTIVES: Physical activity: Current Exercise Habits: The patient does not participate in regular exercise at present(was riding bike daily and going to gym until recent knee injury), Exercise limited by: orthopedic condition(s) Cardiac risk factors: Cardiac Risk Factors include: advanced age (>71mn, >>77women);dyslipidemia;hypertension;male gender;obesity (BMI >30kg/m2);diabetes mellitus;smoking/ tobacco exposure Depression/mood screen:   Dep[facility] screen [facility] 05/06/2018  Decreased Interest 0  Down, Depressed, Hopeless 0  PHQ - 2 Score 0    ADLs:  In your present state of health, do you have any difficulty performing the following activities: 05/06/2018 01/25/2018  Hearing? N N  Vision? N N  Difficulty concentrating or making decisions? N N  Walking or climbing stairs? N N  Dressing or bathing? N N  Doing errands, shopping? N N  Some recent data might be hidden      Cognitive Testing  Alert? Yes  Normal Appearance?Yes  Oriented to person? Yes  Place? Yes   Time? Yes  Recall of three objects?  Yes  Can perform simple calculations? Yes  Displays appropriate judgment?Yes  Can read the correct time from a watch face?Yes  EOL planning: Does Patient Have a Medical Advance Directive?: No Would patient like information on creating a medical advance directive?: Yes (MAU/Ambulatory/Procedural Areas - Information given)   Objective:   Today's Vitals   05/06/18 0955  BP: 140/90  Pulse: 87  Temp: (!) 97.3 F (36.3 C)  SpO2: 95%  Weight: (!) 305 lb (138.3 kg)  Height: 5' 9" (1.753 m)  PainSc: 6   PainLoc: Knee   Body mass index is 45.04 kg/m.  eneral Appearance: Morbidly obese, in no apparent distress. Eyes: PERRLA, EOMs, conjunctiva no swelling or erythema Sinuses: No Frontal/maxillary tenderness ENT/Mouth: Ext aud canals clear, TMs without erythema, bulging. No erythema, swelling, or exudate on post pharynx.  Tonsils not swollen or erythematous. Hearing normal.  Neck: Supple, thyroid normal.  Respiratory: Respiratory effort normal, BS equal bilaterally without rales, rhonchi, wheezing or stridor.  Cardio: RRR with no MRGs. Diminished peripheral pulses without edema.  Abdomen: Soft, obese, + BS.  Non tender, no guarding, rebound, hernias, masses. Lymphatics: Non tender without lymphadenopathy.  Musculoskeletal: Full ROM except right knee, pain medially with flexion to 90 degrees, mild effusion, no laxity, 5/5 strength, Normal gait Skin: Warm, dry without rashes, lesions, ecchymosis.  Neuro: Cranial nerves intact. No cerebellar symptoms.  Psych: Awake and oriented X 3, normal affect, Insight and Judgment appropriate.   Medicare Attestation I have personally reviewed: The patient's medical and social history Their use of alcohol, tobacco or illicit drugs Their current medications and supplements The patient's functional ability including  ADLs,fall risks, home safety risks, cognitive, and hearing and visual impairment Diet and physical activities Evidence for depression or mood disorders  The patient's weight, height, BMI, and visual acuity have been recorded in the chart.  I have made referrals, counseling, and provided education to the patient based on review of the above and I have provided the patient with a written personalized care plan for preventive services.     [name], NP   05/06/2018

## 2018-05-06 ENCOUNTER — Ambulatory Visit (INDEPENDENT_AMBULATORY_CARE_PROVIDER_SITE_OTHER): Payer: Medicare HMO | Admitting: Adult Health

## 2018-05-06 ENCOUNTER — Encounter: Payer: Self-pay | Admitting: Adult Health

## 2018-05-06 VITALS — BP 140/90 | HR 87 | Temp 97.3°F | Ht 69.0 in | Wt 305.0 lb

## 2018-05-06 DIAGNOSIS — Z0000 Encounter for general adult medical examination without abnormal findings: Secondary | ICD-10-CM

## 2018-05-06 DIAGNOSIS — Z0001 Encounter for general adult medical examination with abnormal findings: Secondary | ICD-10-CM

## 2018-05-06 DIAGNOSIS — E559 Vitamin D deficiency, unspecified: Secondary | ICD-10-CM | POA: Diagnosis not present

## 2018-05-06 DIAGNOSIS — R6889 Other general symptoms and signs: Secondary | ICD-10-CM | POA: Diagnosis not present

## 2018-05-06 DIAGNOSIS — E1142 Type 2 diabetes mellitus with diabetic polyneuropathy: Secondary | ICD-10-CM | POA: Diagnosis not present

## 2018-05-06 DIAGNOSIS — K219 Gastro-esophageal reflux disease without esophagitis: Secondary | ICD-10-CM | POA: Diagnosis not present

## 2018-05-06 DIAGNOSIS — N521 Erectile dysfunction due to diseases classified elsewhere: Secondary | ICD-10-CM

## 2018-05-06 DIAGNOSIS — Z79899 Other long term (current) drug therapy: Secondary | ICD-10-CM

## 2018-05-06 DIAGNOSIS — N182 Chronic kidney disease, stage 2 (mild): Secondary | ICD-10-CM | POA: Diagnosis not present

## 2018-05-06 DIAGNOSIS — G8929 Other chronic pain: Secondary | ICD-10-CM

## 2018-05-06 DIAGNOSIS — E785 Hyperlipidemia, unspecified: Secondary | ICD-10-CM

## 2018-05-06 DIAGNOSIS — E1169 Type 2 diabetes mellitus with other specified complication: Secondary | ICD-10-CM

## 2018-05-06 DIAGNOSIS — I10 Essential (primary) hypertension: Secondary | ICD-10-CM | POA: Diagnosis not present

## 2018-05-06 DIAGNOSIS — M545 Low back pain: Secondary | ICD-10-CM

## 2018-05-06 DIAGNOSIS — E1122 Type 2 diabetes mellitus with diabetic chronic kidney disease: Secondary | ICD-10-CM

## 2018-05-06 DIAGNOSIS — I723 Aneurysm of iliac artery: Secondary | ICD-10-CM

## 2018-05-06 DIAGNOSIS — G894 Chronic pain syndrome: Secondary | ICD-10-CM | POA: Diagnosis not present

## 2018-05-06 DIAGNOSIS — M1711 Unilateral primary osteoarthritis, right knee: Secondary | ICD-10-CM

## 2018-05-06 MED ORDER — SIMVASTATIN 40 MG PO TABS: 40.0000 mg | ORAL_TABLET | Freq: Every evening | ORAL | 1 refills | Status: DC

## 2018-05-06 MED ORDER — PHENTERMINE HCL 37.5 MG PO TABS
ORAL_TABLET | ORAL | 2 refills | Status: DC
Start: 2018-05-06 — End: 2018-08-20

## 2018-05-06 NOTE — Patient Instructions (Signed)
[name] , Thank you for taking time to come for your Medicare Wellness Visit. I appreciate your ongoing commitment to your health goals. Please review the following plan we discussed and let me know if I can assist you in the future.   These are the goals we discussed: Goals    . Blood Pressure < 130/80    . LDL CALC < 70    . Weight (lb) < 290 lb (131.5 kg)       This is a list of the screening recommended for you and due dates:  Health Maintenance  Topic Date Due  . Eye exam for diabetics  04/03/2018  . Complete foot exam   04/16/2018  . Hemoglobin A1C  07/27/2018  . Tetanus Vaccine  02/09/2021  . Colon Cancer Screening  03/10/2022  . Pneumococcal vaccine  Completed  .  Hepatitis C: One time screening is recommended by Center for Disease Control  (CDC) for  adults born from [birth year] through 1965.   Completed  . HIV Screening  Completed    Aim for 7+ servings of fruits and vegetables daily  65-80+ fluid ounces of water or unsweet tea for healthy kidneys  Limit to max 1 drink of alcohol per day; avoid smoking/tobacco  Limit animal fats in diet for cholesterol and heart health - choose grass fed whenever available  Avoid highly processed foods, and foods high in saturated/trans fats  Aim for low stress - take time to unwind and care for your mental health  Aim for 150 min of moderate intensity exercise weekly for heart health, and weights twice weekly for bone health  Aim for 7-9 hours of sleep daily   HYPERTENSION INFORMATION  Monitor your blood pressure at home, please keep a record and bring that in with you to your next office visit. Please call if your blood pressure is staying above 130/80 consistently at home.   Go to the ER if any CP, SOB, nausea, dizziness, severe HA, changes vision/speech  Your most recent BP: BP: 140/90   Take your medications faithfully as instructed. Maintain a healthy weight. Get at least 150 minutes of aerobic exercise per  week. Minimize salt intake. Minimize alcohol intake  DASH Eating Plan DASH stands for "Dietary Approaches to Stop Hypertension." The DASH eating plan is a healthy eating plan that has been shown to reduce high blood pressure (hypertension). Additional health benefits may include reducing the risk of type 2 diabetes mellitus, heart disease, and stroke. The DASH eating plan may also help with weight loss. WHAT DO I NEED TO KNOW ABOUT THE DASH EATING PLAN? For the DASH eating plan, you will follow these general guidelines:  Choose foods with a percent daily value for sodium of less than 5% (as listed on the food label).  Use salt-free seasonings or herbs instead of table salt or sea salt.  Check with your health care provider or pharmacist before using salt substitutes.  Eat lower-sodium products, often labeled as "lower sodium" or "no salt added."  Eat fresh foods.  Eat more vegetables, fruits, and low-fat dairy products.  Choose whole grains. Look for the word "whole" as the first word in the ingredient list.  Choose fish and skinless chicken or [place] more often than red meat. Limit fish, poultry, and meat to 6 oz (170 g) each day.  Limit sweets, desserts, sugars, and sugary drinks.  Choose heart-healthy fats.  Limit cheese to 1 oz (28 g) per day.  Eat more home-cooked food  and less restaurant, buffet, and fast food.  Limit fried foods.  Cook foods using methods other than frying.  Limit canned vegetables. If you do use them, rinse them well to decrease the sodium.  When eating at a restaurant, ask that your food be prepared with less salt, or no salt if possible. WHAT FOODS CAN I EAT? Seek help from a dietitian for individual calorie needs. Grains Whole grain or whole wheat bread. Brown rice. Whole grain or whole wheat pasta. Quinoa, bulgur, and whole grain cereals. Low-sodium cereals. Corn or whole wheat flour tortillas. Whole grain cornbread. Whole grain crackers.  Low-sodium crackers. Vegetables Fresh or frozen vegetables (raw, steamed, roasted, or grilled). Low-sodium or reduced-sodium tomato and vegetable juices. Low-sodium or reduced-sodium tomato sauce and paste. Low-sodium or reduced-sodium canned vegetables.  Fruits All fresh, canned (in natural juice), or frozen fruits. Meat and Other Protein Products Ground beef (85% or leaner), grass-fed beef, or beef trimmed of fat. Skinless chicken or [place]. Ground chicken or [place]. Pork trimmed of fat. All fish and seafood. Eggs. Dried beans, peas, or lentils. Unsalted nuts and seeds. Unsalted canned beans. Dairy Low-fat dairy products, such as skim or 1% milk, 2% or reduced-fat cheeses, low-fat ricotta or cottage cheese, or plain low-fat yogurt. Low-sodium or reduced-sodium cheeses. Fats and Oils Tub margarines without trans fats. Light or reduced-fat mayonnaise and salad dressings (reduced sodium). Avocado. Safflower, olive, or canola oils. Natural peanut or almond butter. Other Unsalted popcorn and pretzels. The items listed above may not be a complete list of recommended foods or beverages. Contact your dietitian for more options. WHAT FOODS ARE NOT RECOMMENDED? Grains White bread. White pasta. White rice. Refined cornbread. Bagels and croissants. Crackers that contain trans fat. Vegetables Creamed or fried vegetables. Vegetables in a cheese sauce. Regular canned vegetables. Regular canned tomato sauce and paste. Regular tomato and vegetable juices. Fruits Dried fruits. Canned fruit in light or heavy syrup. Fruit juice. Meat and Other Protein Products Fatty cuts of meat. Ribs, chicken wings, bacon, sausage, bologna, salami, chitterlings, fatback, hot dogs, bratwurst, and packaged luncheon meats. Salted nuts and seeds. Canned beans with salt. Dairy Whole or 2% milk, cream, half-and-half, and cream cheese. Whole-fat or sweetened yogurt. Full-fat cheeses or blue cheese. Nondairy creamers and whipped  toppings. Processed cheese, cheese spreads, or cheese curds. Condiments Onion and garlic salt, seasoned salt, table salt, and sea salt. Canned and packaged gravies. Worcestershire sauce. Tartar sauce. Barbecue sauce. Teriyaki sauce. Soy sauce, including reduced sodium. Steak sauce. Fish sauce. Oyster sauce. Cocktail sauce. Horseradish. Ketchup and mustard. Meat flavorings and tenderizers. Bouillon cubes. Hot sauce. Tabasco sauce. Marinades. Taco seasonings. Relishes. Fats and Oils Butter, stick margarine, lard, shortening, ghee, and bacon fat. Coconut, palm kernel, or palm oils. Regular salad dressings. Other Pickles and olives. Salted popcorn and pretzels. The items listed above may not be a complete list of foods and beverages to avoid. Contact your dietitian for more information. WHERE CAN I FIND MORE INFORMATION? National Heart, Lung, and Blood Institute: [employer] Document Released: 07/24/2011 Document Revised: 12/19/2013 Document Reviewed: 06/08/2013 [facility] Patient Information 2015 [place], [place]. This information is not intended to replace advice given to you by your health care provider. Make sure you discuss any questions you have with your health care provider.

## 2018-05-07 LAB — COMPLETE METABOLIC PANEL WITH GFR
AG Ratio: 1.2 (calc) (ref 1.0–2.5)
ALT: 11 U/L (ref 9–46)
AST: 20 U/L (ref 10–35)
Albumin: 4.2 g/dL (ref 3.6–5.1)
Alkaline phosphatase (APISO): 59 U/L (ref 40–115)
BUN: 21 mg/dL (ref 7–25)
CO2: 25 mmol/L (ref 20–32)
Calcium: 9.4 mg/dL (ref 8.6–10.3)
Chloride: 104 mmol/L (ref 98–110)
Creat: 0.93 mg/dL (ref 0.70–1.25)
GFR, Est African American: 102 mL/min/{1.73_m2} (ref >=60)
GFR, Est Non African American: 88 mL/min/{1.73_m2} (ref >=60)
Globulin: 3.5 g/dL (calc) (ref 1.9–3.7)
Glucose, Bld: 110 mg/dL — ABNORMAL HIGH (ref 65–99)
Potassium: 4.3 mmol/L (ref 3.5–5.3)
Sodium: 138 mmol/L (ref 135–146)
Total Bilirubin: 0.3 mg/dL (ref 0.2–1.2)
Total Protein: 7.7 g/dL (ref 6.1–8.1)

## 2018-05-07 LAB — CBC WITH DIFFERENTIAL/PLATELET
Basophils Absolute: 52 cells/uL (ref 0–200)
Basophils Relative: 1.1 %
Eosinophils Absolute: 127 cells/uL (ref 15–500)
Eosinophils Relative: 2.7 %
HCT: 41 % (ref 38.5–50.0)
Hemoglobin: 13.2 g/dL (ref 13.2–17.1)
Lymphs Abs: 1777 cells/uL (ref 850–3900)
MCH: 26.2 pg — ABNORMAL LOW (ref 27.0–33.0)
MCHC: 32.2 g/dL (ref 32.0–36.0)
MCV: 81.5 fL (ref 80.0–100.0)
MPV: 10.4 fL (ref 7.5–12.5)
Monocytes Relative: 12.5 %
Neutro Abs: 2157 cells/uL (ref 1500–7800)
Neutrophils Relative %: 45.9 %
Platelets: 316 10*3/uL (ref 140–400)
RBC: 5.03 10*6/uL (ref 4.20–5.80)
RDW: 12.7 % (ref 11.0–15.0)
Total Lymphocyte: 37.8 %
WBC mixed population: 588 cells/uL (ref 200–950)
WBC: 4.7 10*3/uL (ref 3.8–10.8)

## 2018-05-07 LAB — LIPID PANEL
Cholesterol: 210 mg/dL — ABNORMAL HIGH (ref <200)
HDL: 44 mg/dL (ref >40)
LDL Cholesterol (Calc): 138 mg/dL (calc) — ABNORMAL HIGH
Non-HDL Cholesterol (Calc): 166 mg/dL (calc) — ABNORMAL HIGH (ref <130)
Total CHOL/HDL Ratio: 4.8 (calc) (ref <5.0)
Triglycerides: 152 mg/dL — ABNORMAL HIGH (ref <150)

## 2018-05-07 LAB — TSH: TSH: 1.66 mIU/L (ref 0.40–4.50)

## 2018-05-07 LAB — HEMOGLOBIN A1C
Hgb A1c MFr Bld: 7.3 % of total Hgb — ABNORMAL HIGH (ref <5.7)
Mean Plasma Glucose: 163 (calc)
eAG (mmol/L): 9 (calc)

## 2018-05-07 LAB — MAGNESIUM: Magnesium: 1.7 mg/dL (ref 1.5–2.5)

## 2018-05-10 ENCOUNTER — Other Ambulatory Visit: Payer: Self-pay | Admitting: Adult Health

## 2018-05-13 ENCOUNTER — Other Ambulatory Visit: Payer: Self-pay | Admitting: Internal Medicine

## 2018-05-13 DIAGNOSIS — E785 Hyperlipidemia, unspecified: Secondary | ICD-10-CM

## 2018-05-13 DIAGNOSIS — E1169 Type 2 diabetes mellitus with other specified complication: Secondary | ICD-10-CM

## 2018-05-13 DIAGNOSIS — I10 Essential (primary) hypertension: Secondary | ICD-10-CM

## 2018-05-13 MED ORDER — DILTIAZEM HCL ER COATED BEADS 120 MG PO CP24: ORAL_CAPSULE | ORAL | 3 refills | Status: DC

## 2018-05-13 MED ORDER — SIMVASTATIN 40 MG PO TABS: ORAL_TABLET | ORAL | 3 refills | Status: DC

## 2018-05-25 DIAGNOSIS — E119 Type 2 diabetes mellitus without complications: Secondary | ICD-10-CM | POA: Diagnosis not present

## 2018-05-25 LAB — HM DIABETES EYE EXAM

## 2018-06-03 ENCOUNTER — Encounter: Payer: Self-pay | Admitting: Internal Medicine

## 2018-08-05 ENCOUNTER — Encounter: Payer: Self-pay | Admitting: Internal Medicine

## 2018-08-05 NOTE — Patient Instructions (Signed)

## 2018-08-05 NOTE — Progress Notes (Signed)
[facility]   Unk Pinto, M.D.     Uvaldo Bristle. Silverio Lay, P.A.-C Liane Comber, [place]                [place], [place] [address] Telephone [phone] Telefax [phone] Annual  Screening/Preventative Visit  & Comprehensive Evaluation & Examination     This very nice 62 y.o. single BM presents for a Screening /Preventative Visit & comprehensive evaluation and management of multiple medical co-morbidities.  Patient has been followed for HTN, HLD, T2_NIDDM/CKD2, and Vitamin D Deficiency. Patient is on CPAP for OSA with improved Restorative sleep.        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     HTN predates circa 2007. Patient's BP has been controlled at home.  Today's BP is at goal - 138/84. Patient denies any cardiac symptoms as chest pain, palpitations, shortness of breath, dizziness or ankle swelling.     Patient's hyperlipidemia is not controlled with diet and poor medication compliance. Patient denies myalgias or other medication SE's. Last lipids were  Lab Results  Component Value Date   CHOL 167 08/06/2018   HDL 47 08/06/2018   LDLCALC 102 (H) 08/06/2018   TRIG 89 08/06/2018   CHOLHDL 3.6 08/06/2018      Patient has hx/o Gluttony  (BMI 45+) and T2_NIDDM presenting in 2014 hospitalized with non-ketotic hyperosmolar ketoacidosis, glu >1000 mg% and hemiballismus and patient denies reactive hypoglycemic symptoms, visual blurring, diabetic polys or paresthesias. Last A1c was not at goal: Lab Results  Component Value Date   HGBA1C 7.2 (H) 08/06/2018       Finally, patient has history of Vitamin D Deficiency and last vitamin D was at goal: Lab Results  Component Value Date   VD25OH 81 08/06/2018   Current Outpatient Medications on File Prior to Visit  Medication Sig  .  Ascorbic Acid (VITA-C PO) Take by mouth.  Marland Kitchen aspirin EC 81 MG tablet Take 81 mg by mouth daily.  . Blood Glucose Monitoring Suppl (ACCU-CHEK GUIDE) w/Device KIT USE TO CHECK BLOOD SUGAR ONE TIME DAILY  . Cholecalciferol (VITAMIN D-3) 5000 UNITS TABS Take 1 tablet by mouth daily.  . diclofenac sodium (VOLTAREN) 1 % GEL Apply 2 g topically 4 (four) times daily.  Marland Kitchen diltiazem (CARTIA XT) 120 MG 24 hr capsule Take 1 capsule daily for BP  . gabapentin (NEURONTIN) 100 MG capsule TAKE 1 CAPSULE THREE TIMES DAILY  FOR  NEUROPATHY  PAINS IN HANDS  . glucose blood (ACCU-CHEK GUIDE) test strip Check blood sugar 1 time a day-[id number]  . losartan (COZAAR) 100 MG tablet TAKE 1 TABLET EVERY DAY FOR BLOOD PRESSURE  . Magnesium-Zinc (MAGNESIUM-CHELATED ZINC PO) Take 400 mg by mouth daily. Takes 1200 mg daily.  . metFORMIN (GLUCOPHAGE-XR) 500 MG 24 hr tablet TAKE 2 TABLETS TWICE DAILY  FOR  DIABETES  . Multiple Vitamins-Minerals (THERAGRAN-M PREMIER 50 PLUS PO) Take 1 tablet by mouth daily.  . Omega-3 Fatty Acids (FISH OIL) 500 MG CAPS Take by mouth daily.  . phentermine (ADIPEX-P) 37.5 MG tablet TAKE 1/2-1 TABLET BY MOUTH EVERY MORNING  . sildenafil (REVATIO) 20 MG tablet TAKE 2-5 TABLETS BY MOUTH DAILY AS NEEDED  . simvastatin (ZOCOR) 40 MG tablet Take 1 tablet at Bedtime for  Cholesterol   No current facility-administered medications on file prior to visit.    Allergies  Allergen Reactions  . Crestor [Rosuvastatin Calcium] Other (See Comments)    Cramping   Past Medical History:  Diagnosis Date  . Diabetes mellitus without complication ([place])   . Hyperlipidemia, mixed 09/29/2012  . Hypertension   . Vitamin D deficiency    Health Maintenance  Topic Date Due  . HEMOGLOBIN A1C  02/05/2019  . OPHTHALMOLOGY EXAM  05/26/2019  . FOOT EXAM  08/07/2019  . TETANUS/TDAP  02/09/2021  . COLONOSCOPY  03/10/2022  . PNEUMOCOCCAL POLYSACCHARIDE VACCINE AGE 12-64 HIGH RISK  Completed  . Hepatitis C Screening   Completed  . HIV Screening  Completed   Immunization History  Administered Date(s) Administered  . Pneumococcal-Unspecified 02/16/2013  . Tdap 02/10/2011   Last Colon - 03/10/2012 Dr Sharlett Iles - Recc 10 yr f/u - due July 2023.  Past Surgical History:  Procedure Laterality Date  . CARPAL TUNNEL RELEASE  2008   bilateral  . CERVICAL DISCECTOMY  2003  . KNEE ARTHROSCOPY  2010   left   Family History  Problem Relation Age of Onset  . Diabetes Father   . Hypertension Father   . Diabetes Sister   . Kidney disease Sister   . Heart disease Mother   . Stroke Mother   . Diabetes Mother   . Colon cancer Neg Hx   . Stomach cancer Neg Hx    Social History   Socioeconomic History  . Marital status: Single    Spouse name: Not on file  . Number of children: Not on file  . Years of education: Not on file  . Highest education level: Not on file  Occupational History  .  Retired disabled Radiation protection practitioner. SS Disability   Tobacco Use  . Smoking status: Former Smoker    Last attempt to quit: 02/25/1991    Years since quitting: 27.4  . Smokeless tobacco: Never Used  Substance and Sexual Activity  . Alcohol use: Not Currently  . Drug use: No  . Sexual activity: Not on file    ROS Constitutional: Denies fever, chills, weight loss/gain, headaches, insomnia,  night sweats or change in appetite. Does c/o fatigue. Eyes: Denies redness, blurred vision, diplopia, discharge, itchy or watery eyes.  ENT: Denies discharge, congestion, post nasal drip, epistaxis, sore throat, earache, hearing loss, dental pain, Tinnitus, Vertigo, Sinus pain or snoring.  Cardio: Denies chest pain, palpitations, irregular heartbeat, syncope, dyspnea, diaphoresis, orthopnea, PND, claudication or edema Respiratory: denies cough, dyspnea, DOE, pleurisy, hoarseness, laryngitis or wheezing.  Gastrointestinal: Denies dysphagia, heartburn, reflux, water brash, pain, cramps, nausea, vomiting, bloating, diarrhea,  constipation, hematemesis, melena, hematochezia, jaundice or hemorrhoids Genitourinary: Denies dysuria, frequency, discharge, hematuria or flank pain. Has urgency, nocturia x 2-3 & occasional hesitancy. Musculoskeletal: Denies arthralgia, myalgia, stiffness, Jt. Swelling, pain, limp or strain/sprain. Denies Falls. Skin: Denies puritis, rash, hives, warts, acne, eczema or change in skin lesion Neuro: No weakness, tremor, incoordination, spasms, paresthesia or pain Psychiatric: Denies confusion, memory loss or sensory loss. Denies Depression. Endocrine: Denies change in weight, skin, hair change, nocturia, and paresthesia, diabetic polys, visual blurring or hyper / hypo glycemic episodes.  Heme/Lymph: No excessive bleeding, bruising or enlarged lymph nodes.  Physical Exam  BP 138/84   Pulse 80   Temp 97.8 F (36.6 C)   Resp 18   Ht _0  (1.753 m)   Wt (!) 302 lb 3.2 oz (137.1 kg)   BMI 44.63 kg/m  General Appearance: Over nourished and well groomed and in no apparent distress.  Eyes: PERRLA, EOMs, conjunctiva no swelling or erythema, normal fundi and vessels. Sinuses: No frontal/maxillary tenderness ENT/Mouth: EACs patent / TMs  nl. Nares clear without erythema, swelling, mucoid exudates. Oral hygiene is good. No erythema, swelling, or exudate. Tongue normal, non-obstructing. Tonsils not swollen or erythematous. Hearing normal.  Neck: Supple, thyroid not palpable. No bruits, nodes or JVD. Respiratory: Respiratory effort normal.  BS equal and clear bilateral without rales, rhonci, wheezing or stridor. Cardio: Heart sounds are normal with regular rate and rhythm and no murmurs, rubs or gallops. Peripheral pulses are normal and equal bilaterally without edema. No aortic or femoral bruits. Chest: symmetric with normal excursions and percussion.  Abdomen: Soft, with Nl bowel sounds. Nontender, no guarding, rebound, hernias, masses, or organomegaly.  Lymphatics: Non tender without  lymphadenopathy.  Musculoskeletal: Full ROM all peripheral extremities, joint stability, 5/5 strength, and normal gait. Skin: Warm and dry without rashes, lesions, cyanosis, clubbing or  ecchymosis.  Neuro: Cranial nerves intact, reflexes equal bilaterally. Normal muscle tone, no cerebellar symptoms. Sensation intact to touch, vibratory and Monofilament to the toes bilaterally. Pysch: Alert and oriented X 3 with normal affect, insight and judgment appropriate.   Assessment and Plan  1. Annual Preventative/Screening Exam   2. Essential hypertension  - EKG 12-Lead - [place], RETROPERITNL ABD,  LTD - Urinalysis, Routine w reflex microscopic - Microalbumin / creatinine urine ratio - CBC with Differential/Platelet - COMPLETE METABOLIC PANEL WITH GFR - Magnesium - TSH  3. Hyperlipidemia, mixed  - EKG 12-Lead - [place], RETROPERITNL ABD,  LTD - Lipid panel - TSH  4. Type 2 diabetes mellitus with stage 2 chronic kidney disease, without long-term current use of insulin (HCC)  - EKG 12-Lead - [place], RETROPERITNL ABD,  LTD - Urinalysis, Routine w reflex microscopic - Microalbumin / creatinine urine ratio - HM DIABETES FOOT EXAM - LOW EXTREMITY NEUR EXAM DOCUM - Hemoglobin A1c - Insulin, random  5. Vitamin D deficiency  - VITAMIN D 25 Hydroxyl  6. Gastroesophageal reflux disease  - CBC with Differential/Platelet  7. Screening for colorectal cancer  - POC Hemoccult Bld/Stl   8. BPH with obstruction/lower urinary tract symptoms  - PSA  9. Prostate cancer screening  - PSA  10. Screening for ischemic heart disease  - EKG 12-Lead - Lipid panel  11. FHx: heart disease  - EKG 12-Lead - [place], RETROPERITNL ABD,  LTD  12. Smoker  - EKG 12-Lead - [place], RETROPERITNL ABD,  LTD  13. Screening for AAA (aortic abdominal aneurysm)  - [place], RETROPERITNL ABD,  LTD  14. Medication management  - Urinalysis, Routine w reflex microscopic - Microalbumin / creatinine urine ratio - Uric  acid - CBC with Differential/Platelet - COMPLETE METABOLIC PANEL WITH GFR - Magnesium - Lipid panel - TSH - Hemoglobin A1c - Insulin, random - VITAMIN D 25 Hydroxyl        Patient was counseled in prudent diet, weight control to achieve/maintain BMI less than 25, BP monitoring, regular exercise and medications as discussed.  Discussed med effects and SE's. Routine screening labs and tests as requested with regular follow-up as recommended. Over 40 minutes of exam, counseling, chart review and high complex critical decision making was performed

## 2018-08-06 ENCOUNTER — Ambulatory Visit (INDEPENDENT_AMBULATORY_CARE_PROVIDER_SITE_OTHER): Payer: Medicare HMO | Admitting: Internal Medicine

## 2018-08-06 VITALS — BP 138/84 | HR 80 | Temp 97.8°F | Resp 18 | Ht 69.0 in | Wt 302.2 lb

## 2018-08-06 DIAGNOSIS — I10 Essential (primary) hypertension: Secondary | ICD-10-CM

## 2018-08-06 DIAGNOSIS — Z0000 Encounter for general adult medical examination without abnormal findings: Secondary | ICD-10-CM | POA: Diagnosis not present

## 2018-08-06 DIAGNOSIS — E559 Vitamin D deficiency, unspecified: Secondary | ICD-10-CM

## 2018-08-06 DIAGNOSIS — E1122 Type 2 diabetes mellitus with diabetic chronic kidney disease: Secondary | ICD-10-CM

## 2018-08-06 DIAGNOSIS — Z79899 Other long term (current) drug therapy: Secondary | ICD-10-CM

## 2018-08-06 DIAGNOSIS — Z1211 Encounter for screening for malignant neoplasm of colon: Secondary | ICD-10-CM

## 2018-08-06 DIAGNOSIS — Z136 Encounter for screening for cardiovascular disorders: Secondary | ICD-10-CM

## 2018-08-06 DIAGNOSIS — Z0001 Encounter for general adult medical examination with abnormal findings: Secondary | ICD-10-CM

## 2018-08-06 DIAGNOSIS — N138 Other obstructive and reflux uropathy: Secondary | ICD-10-CM

## 2018-08-06 DIAGNOSIS — N401 Enlarged prostate with lower urinary tract symptoms: Secondary | ICD-10-CM | POA: Diagnosis not present

## 2018-08-06 DIAGNOSIS — N182 Chronic kidney disease, stage 2 (mild): Secondary | ICD-10-CM | POA: Diagnosis not present

## 2018-08-06 DIAGNOSIS — F17200 Nicotine dependence, unspecified, uncomplicated: Secondary | ICD-10-CM

## 2018-08-06 DIAGNOSIS — Z125 Encounter for screening for malignant neoplasm of prostate: Secondary | ICD-10-CM | POA: Diagnosis not present

## 2018-08-06 DIAGNOSIS — E782 Mixed hyperlipidemia: Secondary | ICD-10-CM | POA: Diagnosis not present

## 2018-08-06 DIAGNOSIS — Z1212 Encounter for screening for malignant neoplasm of rectum: Secondary | ICD-10-CM

## 2018-08-06 DIAGNOSIS — K219 Gastro-esophageal reflux disease without esophagitis: Secondary | ICD-10-CM

## 2018-08-06 DIAGNOSIS — Z8249 Family history of ischemic heart disease and other diseases of the circulatory system: Secondary | ICD-10-CM

## 2018-08-09 LAB — MAGNESIUM: Magnesium: 1.8 mg/dL (ref 1.5–2.5)

## 2018-08-09 LAB — CBC WITH DIFFERENTIAL/PLATELET
Absolute Monocytes: 536 cells/uL (ref 200–950)
Basophils Absolute: 42 cells/uL (ref 0–200)
Basophils Relative: 0.8 %
Eosinophils Absolute: 192 cells/uL (ref 15–500)
Eosinophils Relative: 3.7 %
HCT: 43.4 % (ref 38.5–50.0)
Hemoglobin: 13.7 g/dL (ref 13.2–17.1)
Lymphs Abs: 2548 cells/uL (ref 850–3900)
MCH: 25.8 pg — ABNORMAL LOW (ref 27.0–33.0)
MCHC: 31.6 g/dL — ABNORMAL LOW (ref 32.0–36.0)
MCV: 81.6 fL (ref 80.0–100.0)
MPV: 9.7 fL (ref 7.5–12.5)
Monocytes Relative: 10.3 %
Neutro Abs: 1882 cells/uL (ref 1500–7800)
Neutrophils Relative %: 36.2 %
Platelets: 334 10*3/uL (ref 140–400)
RBC: 5.32 10*6/uL (ref 4.20–5.80)
RDW: 12.7 % (ref 11.0–15.0)
Total Lymphocyte: 49 %
WBC: 5.2 10*3/uL (ref 3.8–10.8)

## 2018-08-09 LAB — URINALYSIS, ROUTINE W REFLEX MICROSCOPIC
Bacteria, UA: NONE SEEN /HPF
Bilirubin Urine: NEGATIVE
Glucose, UA: NEGATIVE
Hgb urine dipstick: NEGATIVE
Hyaline Cast: NONE SEEN /LPF
Ketones, ur: NEGATIVE
Leukocytes, UA: NEGATIVE
Nitrite: NEGATIVE
RBC / HPF: NONE SEEN /HPF (ref 0–2)
Specific Gravity, Urine: 1.023 (ref 1.001–1.03)
Squamous Epithelial / LPF: NONE SEEN /HPF (ref <=5)
pH: 5.5 (ref 5.0–8.0)

## 2018-08-09 LAB — COMPLETE METABOLIC PANEL WITH GFR
AG Ratio: 1.3 (calc) (ref 1.0–2.5)
ALT: 11 U/L (ref 9–46)
AST: 20 U/L (ref 10–35)
Albumin: 4.4 g/dL (ref 3.6–5.1)
Alkaline phosphatase (APISO): 67 U/L (ref 40–115)
BUN: 21 mg/dL (ref 7–25)
CO2: 25 mmol/L (ref 20–32)
Calcium: 9.7 mg/dL (ref 8.6–10.3)
Chloride: 105 mmol/L (ref 98–110)
Creat: 0.99 mg/dL (ref 0.70–1.25)
GFR, Est African American: 94 mL/min/{1.73_m2} (ref >=60)
GFR, Est Non African American: 81 mL/min/{1.73_m2} (ref >=60)
Globulin: 3.4 g/dL (calc) (ref 1.9–3.7)
Glucose, Bld: 102 mg/dL — ABNORMAL HIGH (ref 65–99)
Potassium: 4.1 mmol/L (ref 3.5–5.3)
Sodium: 139 mmol/L (ref 135–146)
Total Bilirubin: 0.4 mg/dL (ref 0.2–1.2)
Total Protein: 7.8 g/dL (ref 6.1–8.1)

## 2018-08-09 LAB — LIPID PANEL
Cholesterol: 167 mg/dL (ref <200)
HDL: 47 mg/dL (ref >40)
LDL Cholesterol (Calc): 102 mg/dL (calc) — ABNORMAL HIGH
Non-HDL Cholesterol (Calc): 120 mg/dL (calc) (ref <130)
Total CHOL/HDL Ratio: 3.6 (calc) (ref <5.0)
Triglycerides: 89 mg/dL (ref <150)

## 2018-08-09 LAB — URIC ACID: Uric Acid, Serum: 5.1 mg/dL (ref 4.0–8.0)

## 2018-08-09 LAB — HEMOGLOBIN A1C
Hgb A1c MFr Bld: 7.2 % of total Hgb — ABNORMAL HIGH (ref <5.7)
Mean Plasma Glucose: 160 (calc)
eAG (mmol/L): 8.9 (calc)

## 2018-08-09 LAB — PSA: PSA: 0.7 ng/mL (ref <=4.0)

## 2018-08-09 LAB — TSH: TSH: 2.31 mIU/L (ref 0.40–4.50)

## 2018-08-09 LAB — MICROALBUMIN / CREATININE URINE RATIO
Creatinine, Urine: 111 mg/dL (ref 20–320)
Microalb Creat Ratio: 89 mcg/mg creat — ABNORMAL HIGH (ref <30)
Microalb, Ur: 9.9 mg/dL

## 2018-08-09 LAB — VITAMIN D 25 HYDROXY (VIT D DEFICIENCY, FRACTURES): Vit D, 25-Hydroxy: 81 ng/mL (ref 30–100)

## 2018-08-09 LAB — INSULIN, RANDOM: Insulin: 7.5 u[IU]/mL (ref 2.0–19.6)

## 2018-08-12 ENCOUNTER — Other Ambulatory Visit: Payer: Self-pay | Admitting: Internal Medicine

## 2018-08-12 ENCOUNTER — Other Ambulatory Visit: Payer: Self-pay | Admitting: Adult Health

## 2018-08-12 ENCOUNTER — Other Ambulatory Visit: Payer: Self-pay | Admitting: Physician Assistant

## 2018-08-12 DIAGNOSIS — I10 Essential (primary) hypertension: Secondary | ICD-10-CM

## 2018-08-20 ENCOUNTER — Other Ambulatory Visit: Payer: Self-pay | Admitting: Adult Health

## 2018-08-31 ENCOUNTER — Other Ambulatory Visit: Payer: Self-pay

## 2018-08-31 DIAGNOSIS — Z1211 Encounter for screening for malignant neoplasm of colon: Secondary | ICD-10-CM | POA: Diagnosis not present

## 2018-08-31 DIAGNOSIS — Z1212 Encounter for screening for malignant neoplasm of rectum: Principal | ICD-10-CM

## 2018-08-31 LAB — POC HEMOCCULT BLD/STL (HOME/3-CARD/SCREEN)
Card #2 Fecal Occult Blod, POC: NEGATIVE
Card #3 Fecal Occult Blood, POC: NEGATIVE
Fecal Occult Blood, POC: NEGATIVE

## 2018-09-10 DIAGNOSIS — H52209 Unspecified astigmatism, unspecified eye: Secondary | ICD-10-CM | POA: Diagnosis not present

## 2018-09-10 DIAGNOSIS — H5203 Hypermetropia, bilateral: Secondary | ICD-10-CM | POA: Diagnosis not present

## 2018-09-10 DIAGNOSIS — H524 Presbyopia: Secondary | ICD-10-CM | POA: Diagnosis not present

## 2018-09-20 ENCOUNTER — Other Ambulatory Visit: Payer: Self-pay | Admitting: Internal Medicine

## 2018-10-25 ENCOUNTER — Other Ambulatory Visit: Payer: Self-pay | Admitting: Internal Medicine

## 2018-10-27 ENCOUNTER — Telehealth: Payer: Self-pay | Admitting: *Deleted

## 2018-10-27 NOTE — Telephone Encounter (Signed)
Entered in error

## 2018-10-27 NOTE — Telephone Encounter (Signed)
Patient called and states he does not recall taking 2 BP medications. He recalls taking Losartan, but not Diltiazem.  Per Dr Oneta Rack, he should take both medications.  The patient agreed to take both, will monitor his BP and call if he has problems.

## 2018-11-16 DIAGNOSIS — E1122 Type 2 diabetes mellitus with diabetic chronic kidney disease: Secondary | ICD-10-CM | POA: Insufficient documentation

## 2018-11-16 DIAGNOSIS — N182 Chronic kidney disease, stage 2 (mild): Secondary | ICD-10-CM

## 2018-11-16 NOTE — Progress Notes (Signed)
Virtual Visit via Telephone Note  I connected with [name] [name] on 11/17/18 at 11:30 AM EDT by telephone and verified that I am speaking with the correct person using two identifiers.   I discussed the limitations, risks, security and privacy concerns of performing an evaluation and management service by telephone and the availability of in person appointments. I also discussed with the patient that there may be a patient responsible charge related to this service. The patient expressed understanding and agreed to proceed.  I discussed the assessment and treatment plan with the patient. The patient was provided an opportunity to ask questions and all were answered. The patient agreed with the plan and demonstrated an understanding of the instructions.   I provided 25 minutes of non-face-to-face time during this encounter.   [name] Ribas, NP  ------------------------------------------------------------------------------------------   FOLLOW UP  Assessment and Plan:   Hypertension No cuff to report recent BPs; he will obtain and call in 2 weeks; currently taking losartan 100 mg only, not taking diltiazem - advised check BPs without x 3 and with x 3 and call back in 2 weeks Monitor blood pressure at home; patient to call if consistently greater than 130/80 Continue DASH diet.   Reminder to go to the ER if any CP, SOB, nausea, dizziness, severe HA, changes vision/speech, left arm numbness and tingling and jaw pain.  Cholesterol Currently above goal; didn't tolerate crestor; on simvastatin 40 mg, defer changes today, consider adding zetia 10 mg after next visit if no significant improvement on labs, working on weight loss Continue low cholesterol diet and exercise.  Check lipid panel.   Diabetes with diabetic chronic kidney disease and neuropathy and circulatory complications Continue medication: metformin 2000 mg daily  Continue diet and exercise.  Perform daily foot/skin  check, notify office of any concerning changes.  Restart checking fasting glucose and he will call back to report in 2 weeks Check A1C  Obesity with co morbidities Long discussion about weight loss, diet, and exercise  Recommended diet heavy in fruits and veggies and low in animal meats, cheeses, and dairy products, appropriate calorie intake Discussed ideal weight for height  Patient on phentermine with benefit and no SE, taking drug breaks; continue close follow up.  Patient will work on Nurse, children's, stop drinking sweetened drinks He will get scale and start weighing regularly - follow up phone call 2 weeks Will follow up in 3 months  Vitamin D Def At goal at last visit; continue supplementation to maintain goal of 70-100 Defer Vit D level  Continue diet and meds as discussed. Further disposition pending results of labs. Discussed med's effects and SE's.   Over 30 minutes of exam, counseling, chart review, and critical decision making was performed.   Future Appointments  Date Time Provider [place]  11/17/2018 11:30 AM Liane Comber, NP [facility] None  02/17/2019 10:30 AM Unk Pinto, MD [facility] None  05/24/2019 10:00 AM Liane Comber, NP [facility] None  08/29/2019 10:00 AM Unk Pinto, MD [facility] None    ----------------------------------------------------------------------------------------------------------------------  HPI 63 y.o. male  presents for 3 month follow up on hypertension, cholesterol, diabetes, morbid obesity and vitamin D deficiency.   He is followed by Dr. [name] for bilateral knee pain, s/p scope several years ago, overdue for follow up, will call to schedule follow up for possible injection. He is not currently taking NSAID. Recommended he initiate   he is prescribed phentermine for weight loss, taking 1/2 tab daily only with perceived benefit.  While on the medication they have lost ? lbs since last visit. They deny  palpitations, anxiety, trouble sleeping, elevated BP.   He doesn't have a scale to weigh but he reports will buy and call back in 1-2 weeks BMI is There is no height or weight on file to calculate BMI., he is working on diet, exercise limited due to knee pain.  Wt Readings from Last 3 Encounters:  08/06/18 (!) 302 lb 3.2 oz (137.1 kg)  05/06/18 (!) 305 lb (138.3 kg)  03/30/18 290 lb (131.5 kg)   Typical breakfast: almond and wheat cereal, stopped raisin bran Typical lunch: has been skipping, or has a salad Typical dinner: Varies, boiled chicken with whole grain rice, salad, might have [place]  Snacks: almonds and sunflower seeds Exercise: limited due to knee Water intake: 6-7 bottles, does have "diabetic tea" occasionally.   His has not been checking BP due to out of batteries in cuff, today their BP is    He does not workout. He denies chest pain, shortness of breath, dizziness.   He is on cholesterol medication (simvastatin 40 mg daily, SE with crestor) and denies myalgias. His cholesterol is not at goal. The cholesterol last visit was:   Lab Results  Component Value Date   CHOL 167 08/06/2018   HDL 47 08/06/2018   LDLCALC 102 (H) 08/06/2018   TRIG 89 08/06/2018   CHOLHDL 3.6 08/06/2018    He has been working on diet and exercise for T2 diabetes (treated by metformin 2000 mg daily), and denies foot ulcerations, hyperglycemia, hypoglycemia , increased appetite, nausea, paresthesia of the feet, polydipsia, polyuria, visual disturbances, vomiting and weight loss. Does check fasting sugar - runs 110-130s. Last A1C in the office was:  Lab Results  Component Value Date   HGBA1C 7.2 (H) 08/06/2018   Patient is on Vitamin D supplement and at goal at last check:    Lab Results  Component Value Date   VD25OH 81 08/06/2018       Current Medications:  Current Outpatient Medications on File Prior to Visit  Medication Sig  . Ascorbic Acid (VITA-C PO) Take by mouth.  Marland Kitchen aspirin EC 81 MG  tablet Take 81 mg by mouth daily.  . Blood Glucose Monitoring Suppl (ACCU-CHEK GUIDE) w/Device KIT USE TO CHECK BLOOD SUGAR ONE TIME DAILY  . Cholecalciferol (VITAMIN D-3) 5000 UNITS TABS Take 1 tablet by mouth daily.  Marland Kitchen gabapentin (NEURONTIN) 100 MG capsule TAKE 1 CAPSULE THREE TIMES DAILY  FOR  NEUROPATHY  PAINS IN HANDS (Patient taking differently: as needed. TAKE 1 CAPSULE THREE TIMES DAILY  FOR  NEUROPATHY  PAINS IN HANDS)  . glucose blood (ACCU-CHEK GUIDE) test strip Check blood sugar 1 time a day-DX-E11.22  . losartan (COZAAR) 100 MG tablet TAKE 1 TABLET EVERY DAY FOR BLOOD PRESSURE  . Magnesium-Zinc (MAGNESIUM-CHELATED ZINC PO) Take 400 mg by mouth daily. Takes 1200 mg daily.  . metFORMIN (GLUCOPHAGE-XR) 500 MG 24 hr tablet TAKE 2 TABLETS TWICE DAILY FOR DIABETES  . Multiple Vitamins-Minerals (THERAGRAN-M PREMIER 50 PLUS PO) Take 1 tablet by mouth daily.  . Omega-3 Fatty Acids (FISH OIL) 500 MG CAPS Take by mouth daily.  . phentermine (ADIPEX-P) 37.5 MG tablet TAKE ONE-HALF TO ONE TABLET BY MOUTH EVERY MORNING  . sildenafil (REVATIO) 20 MG tablet TAKE 2 TO 5 TABLETS BY MOUTH DAILY AS NEEDED  . simvastatin (ZOCOR) 40 MG tablet Take 1 tablet at Bedtime for Cholesterol  . diltiazem (CARTIA XT) 120 MG 24  hr capsule Take 1 capsule daily for BP (Patient not taking: Reported on 11/17/2018)   No current facility-administered medications on file prior to visit.      Allergies:  Allergies  Allergen Reactions  . Crestor [Rosuvastatin Calcium] Other (See Comments)    Cramping     Medical History:  Past Medical History:  Diagnosis Date  . Diabetes mellitus without complication ([place])   . Hyperlipidemia, mixed 09/29/2012  . Hypertension   . Vitamin D deficiency    Family history- Reviewed and unchanged Social history- Reviewed and unchanged   Review of Systems:  Review of Systems  Constitutional: Negative for malaise/fatigue and weight loss.  HENT: Negative for hearing loss and  tinnitus.   Eyes: Negative for blurred vision and double vision.  Respiratory: Negative for cough, shortness of breath and wheezing.   Cardiovascular: Negative for chest pain, palpitations, orthopnea, claudication and leg swelling.  Gastrointestinal: Negative for abdominal pain, blood in stool, constipation, diarrhea, heartburn, melena, nausea and vomiting.  Genitourinary: Negative.   Musculoskeletal: Positive for joint pain (bilateral knees). Negative for myalgias.  Skin: Negative for rash.  Neurological: Negative for dizziness, tingling, sensory change, weakness and headaches.  Endo/Heme/Allergies: Negative for polydipsia.  Psychiatric/Behavioral: Negative.   All other systems reviewed and are negative.     Physical Exam: There were no vitals taken for this visit. Wt Readings from Last 3 Encounters:  08/06/18 (!) 302 lb 3.2 oz (137.1 kg)  05/06/18 (!) 305 lb (138.3 kg)  03/30/18 290 lb (131.5 kg)   General:Well sounding, in no apparent distress.  ENT/Mouth: No hoarseness, No cough for duration of visit.  Respiratory: completing full sentences without distress, without audible wheeze Neuro: Awake and oriented X 3,  Psych:  Insight and Judgment appropriate.    [name] Ribas, NP 11:28 AM [facility]

## 2018-11-17 ENCOUNTER — Ambulatory Visit: Payer: Medicare HMO | Admitting: Adult Health

## 2018-11-17 ENCOUNTER — Other Ambulatory Visit: Payer: Self-pay

## 2018-11-17 ENCOUNTER — Encounter: Payer: Self-pay | Admitting: Adult Health

## 2018-11-17 DIAGNOSIS — N182 Chronic kidney disease, stage 2 (mild): Secondary | ICD-10-CM | POA: Diagnosis not present

## 2018-11-17 DIAGNOSIS — E1169 Type 2 diabetes mellitus with other specified complication: Secondary | ICD-10-CM

## 2018-11-17 DIAGNOSIS — I10 Essential (primary) hypertension: Secondary | ICD-10-CM | POA: Diagnosis not present

## 2018-11-17 DIAGNOSIS — E559 Vitamin D deficiency, unspecified: Secondary | ICD-10-CM | POA: Diagnosis not present

## 2018-11-17 DIAGNOSIS — Z79899 Other long term (current) drug therapy: Secondary | ICD-10-CM | POA: Diagnosis not present

## 2018-11-17 DIAGNOSIS — E785 Hyperlipidemia, unspecified: Secondary | ICD-10-CM

## 2018-11-17 DIAGNOSIS — E1122 Type 2 diabetes mellitus with diabetic chronic kidney disease: Secondary | ICD-10-CM

## 2018-11-22 DIAGNOSIS — M170 Bilateral primary osteoarthritis of knee: Secondary | ICD-10-CM | POA: Diagnosis not present

## 2018-11-29 ENCOUNTER — Encounter: Payer: Self-pay | Admitting: Adult Health

## 2018-11-29 ENCOUNTER — Ambulatory Visit: Payer: Medicare HMO | Admitting: Adult Health

## 2018-11-29 DIAGNOSIS — Z789 Other specified health status: Secondary | ICD-10-CM

## 2018-11-29 DIAGNOSIS — E1122 Type 2 diabetes mellitus with diabetic chronic kidney disease: Secondary | ICD-10-CM | POA: Diagnosis not present

## 2018-11-29 DIAGNOSIS — I10 Essential (primary) hypertension: Secondary | ICD-10-CM | POA: Diagnosis not present

## 2018-11-29 DIAGNOSIS — E1169 Type 2 diabetes mellitus with other specified complication: Secondary | ICD-10-CM | POA: Diagnosis not present

## 2018-11-29 DIAGNOSIS — E785 Hyperlipidemia, unspecified: Secondary | ICD-10-CM

## 2018-11-29 DIAGNOSIS — N182 Chronic kidney disease, stage 2 (mild): Secondary | ICD-10-CM

## 2018-11-29 MED ORDER — HYDROCHLOROTHIAZIDE 25 MG PO TABS: ORAL_TABLET | ORAL | 1 refills | Status: DC

## 2018-11-29 NOTE — Progress Notes (Signed)
Virtual Visit via Telephone Note  I connected with [name] [name] on 11/29/18 at  by telephone and verified that I am speaking with the correct person using two identifiers.   I discussed the limitations, risks, security and privacy concerns of performing an evaluation and management service by telephone and the availability of in person appointments. I also discussed with the patient that there may be a patient responsible charge related to this service. The patient expressed understanding and agreed to proceed.   History of Present Illness:  63 y.o. AA with morbid obesity, T2DM, htn, hyperlipidemia with limited health literacy was called for 2 week follow up on BPs and glucose.   He reports has been taking losartan 100 mg daily, hasn't been taking diltiazem 120 mg, BPs for past week reported as 153/96, 136/95, 162/110, 146/104, 143/97, 149/102.   He has been checking fasting glucose; reports 143, 129, 115, 149, 103, 116, 156. He is taking metformin 2000 mg daily. Attributes this AM elevated glucose to honey buns he ate last night.   He has not yet gotten batteries to check weights.     Observations/Objective:  General : Well sounding patient in no apparent distress HEENT: no hoarseness, no cough for duration of visit Lungs: speaks in complete sentences, no audible wheezing, no apparent distress Neurological: alert, oriented x 3 Psychiatric: pleasant, judgement appropriate   Assessment and Plan:  Diagnoses and all orders for this visit:  Essential hypertension STOP diltiazem, continue losartan 100 mg daily  Initiate medication: hctz 25 mg daily  Monitor blood pressure at home; call if consistently over 130/80 Discussed DASH diet Advised to go to the ER if any CP, SOB, nausea, dizziness, severe HA, changes vision/speech, left arm numbness and tingling and jaw pain. Follow up in 1 month for CCM phone visit -     hydrochlorothiazide (HYDRODIURIL) 25 MG tablet; Take 1 tablet  daily for BP and fluid  Type 2 diabetes mellitus with stage 2 chronic kidney disease, without long-term current use of insulin (HCC) Continue checking daily fasting glucose; goal <130 reviewed with patient Work on weight loss, add exercise as tolerated Pay attention to glucose in relation to diet and slowly change eating habits  Tolerating metformin 2000 mg daily well   Hyperlipidemia associated with type 2 diabetes mellitus (HCC) Continue statin; goal LDL <70 reviewed  Morbid obesity (BMI 40+) Discussed end goal weight <250; initial goal 290lb Still doesn't have scale working - get batteries and start weighing at least weekly towards goal  Use fasting glucose at guide, slowly improve diet - he is motivated to work towards this in order to be able to have TKA  Enrolled in chronic care management  Comprehensive care plan:  Patient/caregiver was given comprehensive care plan We will continue to monitor these goals every 3 months with an office visit and every month by a telephone call  Patient can contact the office any time with the phone number and get to a provider via the answering service or they can use Mychart.   Verbal permission was received from the patient to review comprehensive care management, they understand they have the right to stop CCM services at any time.   The patient is self managing medications at home.  Medications were reviewed with the patient today as well as adherence and potential interactions.   The patient does not need home health services at this time.     Follow Up Instructions:    I discussed the assessment and  treatment plan with the patient. The patient was provided an opportunity to ask questions and all were answered. The patient agreed with the plan and demonstrated an understanding of the instructions.   The patient was advised to call back or seek an in-person evaluation if the symptoms worsen or if the condition fails to improve as  anticipated.  I provided 30 minutes of non-face-to-face time during this encounter.   Dan MakerAshley C Gina Costilla, NP

## 2018-12-08 ENCOUNTER — Telehealth: Payer: Self-pay

## 2018-12-08 NOTE — Telephone Encounter (Signed)
Patient notified

## 2018-12-08 NOTE — Telephone Encounter (Signed)
Simvastatin is causing cramps and muscle spasms. States that he cannot take this, making him feel all out of sorts.  Please advise.

## 2018-12-24 ENCOUNTER — Other Ambulatory Visit: Payer: Self-pay | Admitting: Internal Medicine

## 2018-12-24 DIAGNOSIS — I10 Essential (primary) hypertension: Secondary | ICD-10-CM

## 2018-12-28 ENCOUNTER — Telehealth: Payer: Medicare HMO

## 2018-12-28 DIAGNOSIS — N182 Chronic kidney disease, stage 2 (mild): Secondary | ICD-10-CM

## 2018-12-28 DIAGNOSIS — Z79899 Other long term (current) drug therapy: Secondary | ICD-10-CM | POA: Diagnosis not present

## 2018-12-28 DIAGNOSIS — Z789 Other specified health status: Secondary | ICD-10-CM | POA: Diagnosis not present

## 2018-12-28 DIAGNOSIS — E1122 Type 2 diabetes mellitus with diabetic chronic kidney disease: Secondary | ICD-10-CM

## 2018-12-28 DIAGNOSIS — I10 Essential (primary) hypertension: Secondary | ICD-10-CM

## 2018-12-28 NOTE — Telephone Encounter (Signed)
Patient called and was unavailable to talk today, plans on calling our office tomorrow to complete CCM.

## 2018-12-29 NOTE — Telephone Encounter (Signed)
CCM telephone visit  Medications reviewed/reconciled: Yes  Current Outpatient Medications (Endocrine & Metabolic):  .  metFORMIN (GLUCOPHAGE-XR) 500 MG 24 hr tablet, TAKE 2 TABLETS TWICE DAILY FOR DIABETES  Current Outpatient Medications (Cardiovascular):  .  hydrochlorothiazide (HYDRODIURIL) 25 MG tablet, Take 1 tablet daily for BP and fluid .  losartan (COZAAR) 100 MG tablet, Take 1 tablet Daily For BP .  sildenafil (REVATIO) 20 MG tablet, TAKE 2 TO 5 TABLETS BY MOUTH DAILY AS NEEDED .  simvastatin (ZOCOR) 40 MG tablet, Take 1 tablet at Bedtime for Cholesterol   Current Outpatient Medications (Analgesics):  .  aspirin EC 81 MG tablet, Take 81 mg by mouth daily.   Current Outpatient Medications (Other):  Marland Kitchen  Ascorbic Acid (VITA-C PO), Take by mouth. .  Blood Glucose Monitoring Suppl (ACCU-CHEK GUIDE) w/Device KIT, USE TO CHECK BLOOD SUGAR ONE TIME DAILY .  Cholecalciferol (VITAMIN D-3) 5000 UNITS TABS, Take 1 tablet by mouth daily. Marland Kitchen  gabapentin (NEURONTIN) 100 MG capsule, TAKE 1 CAPSULE THREE TIMES DAILY  FOR  NEUROPATHY  PAINS IN HANDS (Patient taking differently: as needed. TAKE 1 CAPSULE THREE TIMES DAILY  FOR  NEUROPATHY  PAINS IN HANDS) .  glucose blood (ACCU-CHEK GUIDE) test strip, Check blood sugar 1 time a day-DX-E11.22 .  Magnesium-Zinc (MAGNESIUM-CHELATED ZINC PO), Take 400 mg by mouth daily. Takes 1200 mg daily. .  Multiple Vitamins-Minerals (THERAGRAN-M PREMIER 50 PLUS PO), Take 1 tablet by mouth daily. .  Omega-3 Fatty Acids (FISH OIL) 500 MG CAPS, Take by mouth daily. .  phentermine (ADIPEX-P) 37.5 MG tablet, TAKE ONE-HALF TO ONE TABLET BY MOUTH EVERY MORNING  Medication adherence: Yes Any Side effects? Yes, Simvastatin. Has stopped for about 2-3 weeks, causing muscle aches and spasms. They have subsided since stopping.   Refills needed: No refills needed at this time.  Health Logs: Patient has been consistently checking and recording both BP and BS. Trying to stay in  range of goal.   Goals Reviewed:  Goals    . Blood Pressure < 130/80     Check blood pressure daily and keep a log    . HEMOGLOBIN A1C < 7.0     Check fasting glucose daily and keep a log (goal <130)    . LDL CALC < 70    . Weight (lb) < 290 lb (131.5 kg)       Any patient concerns?:  No complaints or concerns at this time.   Plan: Patient will continue to work on diet and exercise. Continue to check blood pressure and glucose daily and keeping an updated log.       Time Spent: 15 minutes  Chancy Hurter, CMA

## 2018-12-29 NOTE — Addendum Note (Signed)
Addended by: Dionicio Stall on: 12/29/2018 11:36 AM   Modules accepted: Level of Service

## 2019-01-12 DIAGNOSIS — M1711 Unilateral primary osteoarthritis, right knee: Secondary | ICD-10-CM | POA: Diagnosis not present

## 2019-01-19 IMAGING — DX DG KNEE COMPLETE 4+V*L*
4 series · 4 of 4 positions shown · non-contrast
Comparison: MRI left knee 02/15/2009.

CLINICAL DATA: Twisting injury left knee going down stairs
yesterday. Initial encounter.

EXAM:
LEFT KNEE - COMPLETE 4+ VIEW

[knee ap]
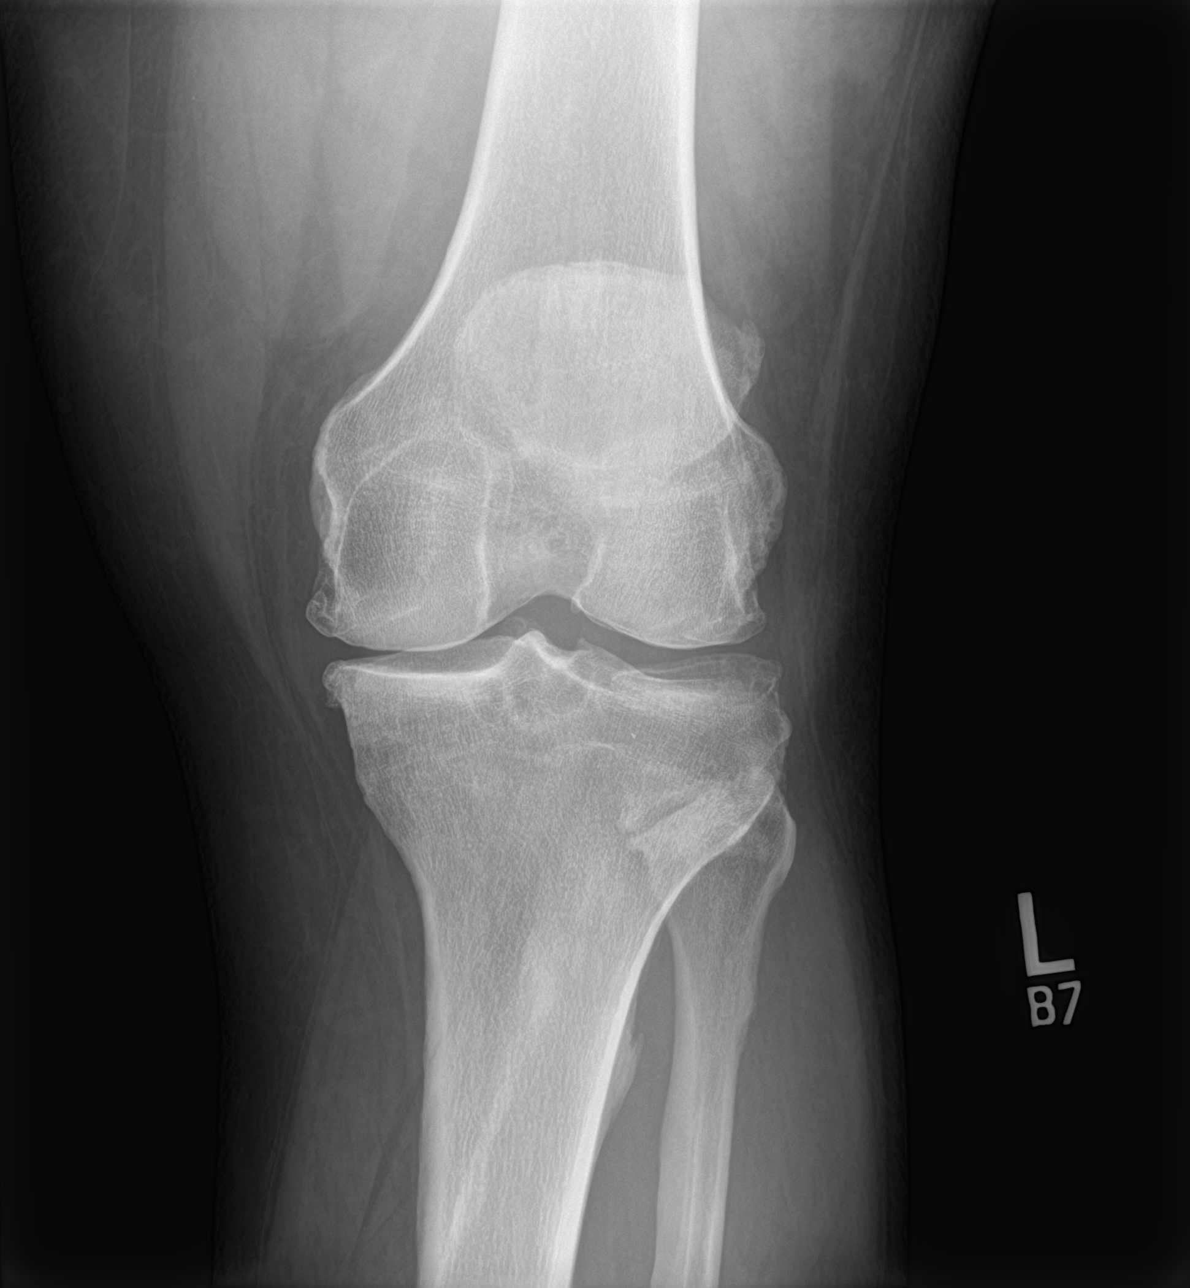

[knee lat]
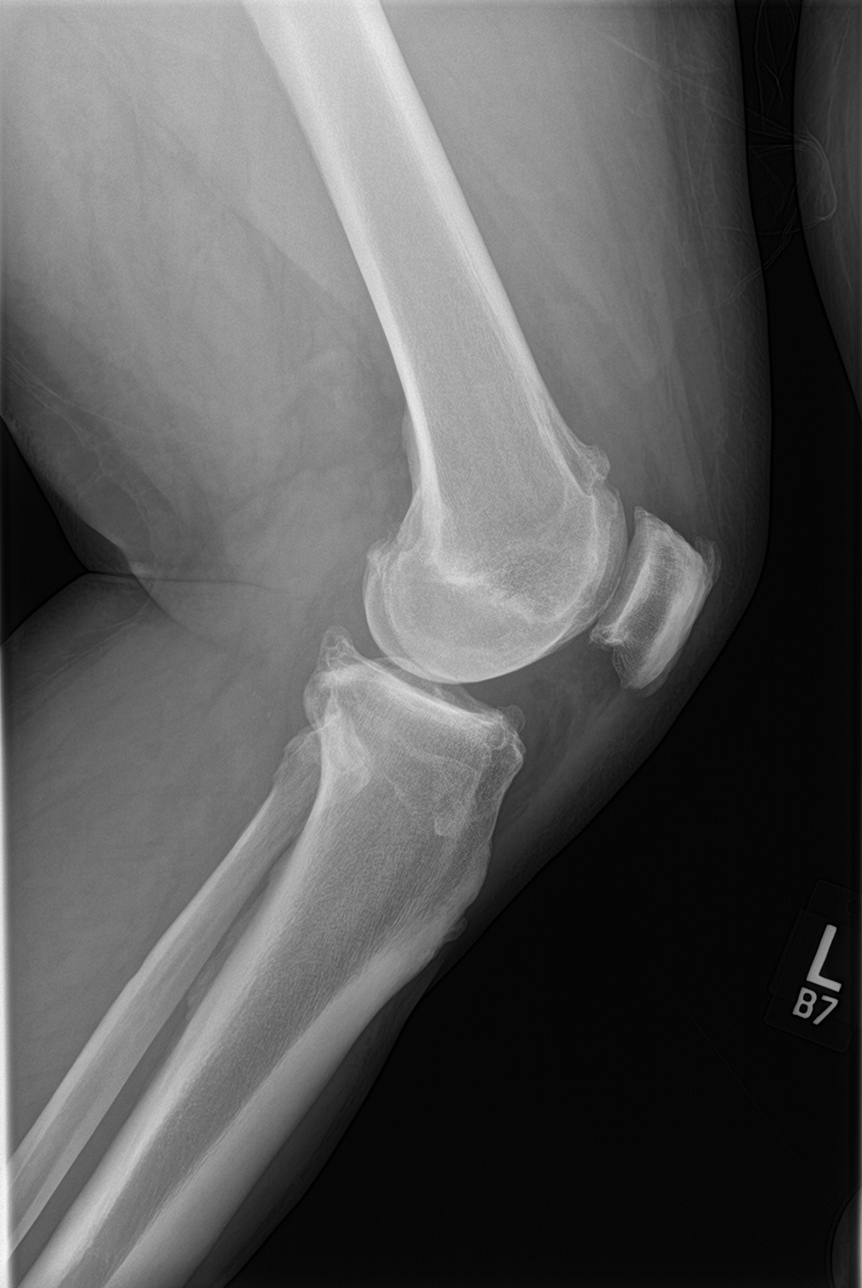

[knee obl (1 of 2)]
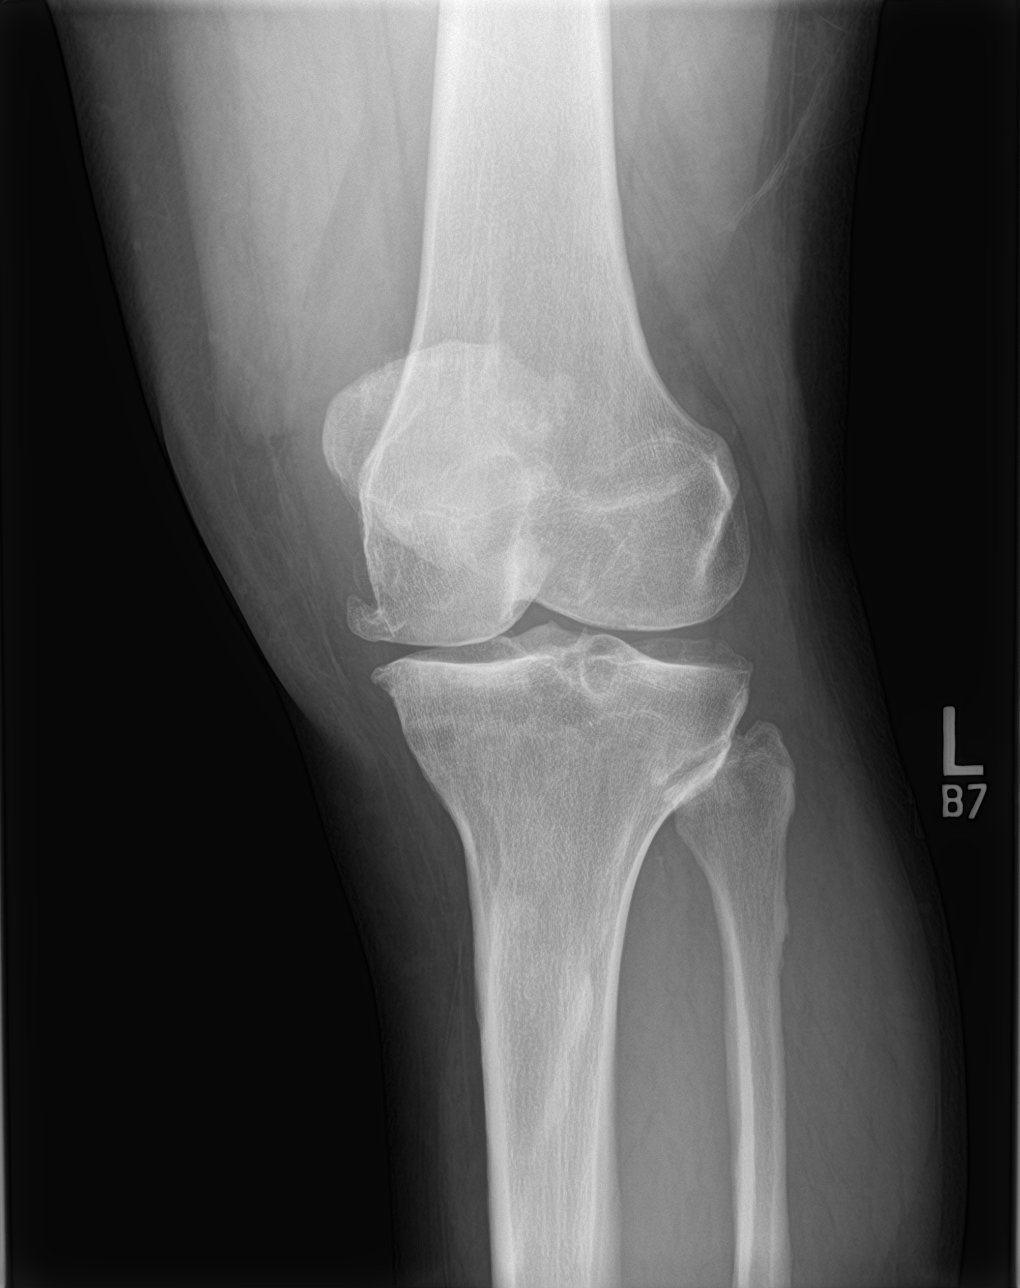

[knee obl (2 of 2)]
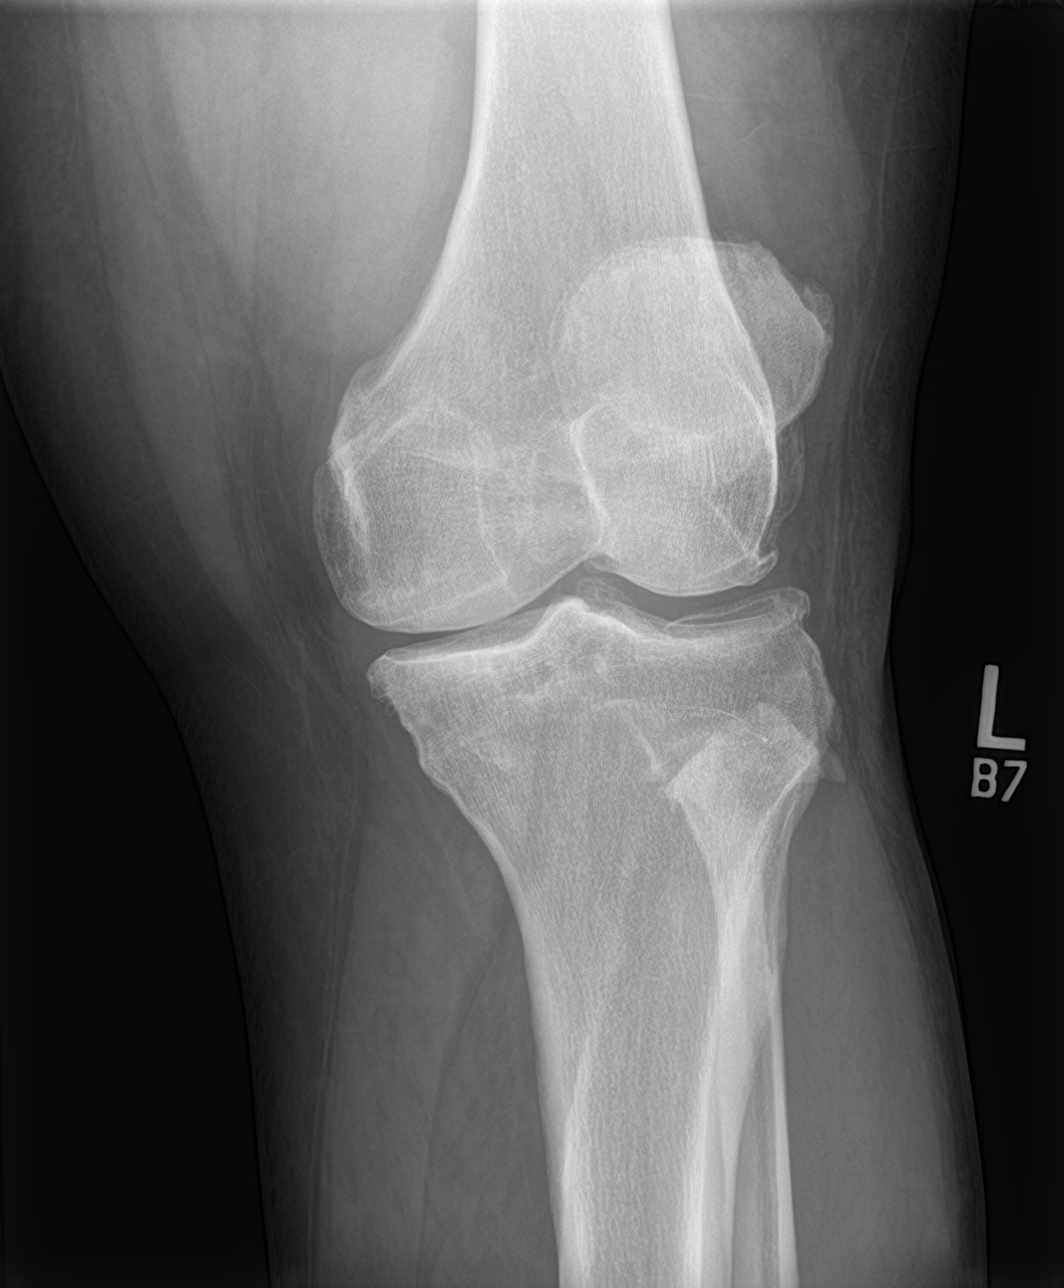

[4 of 4 positions shown; findings below may reference images not displayed]

FINDINGS: There is no acute bony or joint abnormality. The patient has
degenerative disease about the knee appearing worst in the medial
compartment. Mild spur off the superior pole of the patella at the
quadriceps tendon insertion is noted. No focal bony lesion. No joint
effusion.
IMPRESSION: No acute abnormality.

Osteoarthritis about the knee appears worst medially.

## 2019-02-14 ENCOUNTER — Telehealth (INDEPENDENT_AMBULATORY_CARE_PROVIDER_SITE_OTHER): Payer: Medicare HMO

## 2019-02-14 DIAGNOSIS — Z789 Other specified health status: Secondary | ICD-10-CM | POA: Diagnosis not present

## 2019-02-14 DIAGNOSIS — I10 Essential (primary) hypertension: Secondary | ICD-10-CM

## 2019-02-14 DIAGNOSIS — Z79899 Other long term (current) drug therapy: Secondary | ICD-10-CM

## 2019-02-14 DIAGNOSIS — E1122 Type 2 diabetes mellitus with diabetic chronic kidney disease: Secondary | ICD-10-CM

## 2019-02-14 DIAGNOSIS — N182 Chronic kidney disease, stage 2 (mild): Secondary | ICD-10-CM

## 2019-02-14 NOTE — Telephone Encounter (Signed)
CCM telephone visit  Medications reviewed/reconciled: YES  Current Outpatient Medications (Endocrine & Metabolic):  .  metFORMIN (GLUCOPHAGE-XR) 500 MG 24 hr tablet, TAKE 2 TABLETS TWICE DAILY FOR DIABETES  Current Outpatient Medications (Cardiovascular):  .  hydrochlorothiazide (HYDRODIURIL) 25 MG tablet, Take 1 tablet daily for BP and fluid .  losartan (COZAAR) 100 MG tablet, Take 1 tablet Daily For BP .  sildenafil (REVATIO) 20 MG tablet, TAKE 2 TO 5 TABLETS BY MOUTH DAILY AS NEEDED .  simvastatin (ZOCOR) 40 MG tablet, Take 1 tablet at Bedtime for Cholesterol   Current Outpatient Medications (Analgesics):  .  aspirin EC 81 MG tablet, Take 81 mg by mouth daily.   Current Outpatient Medications (Other):  Marland Kitchen  Ascorbic Acid (VITA-C PO), Take by mouth. .  Blood Glucose Monitoring Suppl (ACCU-CHEK GUIDE) w/Device KIT, USE TO CHECK BLOOD SUGAR ONE TIME DAILY .  Cholecalciferol (VITAMIN D-3) 5000 UNITS TABS, Take 1 tablet by mouth daily. Marland Kitchen  gabapentin (NEURONTIN) 100 MG capsule, TAKE 1 CAPSULE THREE TIMES DAILY  FOR  NEUROPATHY  PAINS IN HANDS (Patient taking differently: as needed. TAKE 1 CAPSULE THREE TIMES DAILY  FOR  NEUROPATHY  PAINS IN HANDS) .  glucose blood (ACCU-CHEK GUIDE) test strip, Check blood sugar 1 time a day-DX-E11.22 .  Magnesium-Zinc (MAGNESIUM-CHELATED ZINC PO), Take 400 mg by mouth daily. Takes 1200 mg daily. .  Multiple Vitamins-Minerals (THERAGRAN-M PREMIER 50 PLUS PO), Take 1 tablet by mouth daily. .  Omega-3 Fatty Acids (FISH OIL) 500 MG CAPS, Take by mouth daily. .  phentermine (ADIPEX-P) 37.5 MG tablet, TAKE ONE-HALF TO ONE TABLET BY MOUTH EVERY MORNING  Medication adherence: YES  Any Side effects? NO  Refills needed: NO  Health Logs: Has been keeping a log of blood sugars, not able to give those readings because patient was not at home. States that his BP cuff is not working accurately, so he is unable to give readings. Yitzhak has been working on his weight and  today he was 287lb.   Goals Reviewed: Goals    . Blood Pressure < 130/80     Check blood pressure daily and keep a log    . HEMOGLOBIN A1C < 7.0     Check fasting glucose daily and keep a log (goal <130)    . LDL CALC < 70    . Weight (lb) < 290 lb (131.5 kg)       Any patient concerns?:  No complaints or concerns  Plan: Continue to work on diet and weight loss, will purchase a new scale and blood pressure cuff.      Time Spent: 15 min  Chancy Hurter, CMA

## 2019-02-17 ENCOUNTER — Other Ambulatory Visit: Payer: Self-pay

## 2019-02-17 ENCOUNTER — Ambulatory Visit (INDEPENDENT_AMBULATORY_CARE_PROVIDER_SITE_OTHER): Payer: Medicare HMO | Admitting: Internal Medicine

## 2019-02-17 ENCOUNTER — Encounter: Payer: Self-pay | Admitting: Internal Medicine

## 2019-02-17 VITALS — BP 126/80 | HR 84 | Temp 97.3°F | Resp 18 | Ht 69.0 in | Wt 298.2 lb

## 2019-02-17 DIAGNOSIS — E559 Vitamin D deficiency, unspecified: Secondary | ICD-10-CM | POA: Diagnosis not present

## 2019-02-17 DIAGNOSIS — I10 Essential (primary) hypertension: Secondary | ICD-10-CM | POA: Diagnosis not present

## 2019-02-17 DIAGNOSIS — E782 Mixed hyperlipidemia: Secondary | ICD-10-CM | POA: Diagnosis not present

## 2019-02-17 DIAGNOSIS — Z79899 Other long term (current) drug therapy: Secondary | ICD-10-CM | POA: Diagnosis not present

## 2019-02-17 DIAGNOSIS — N182 Chronic kidney disease, stage 2 (mild): Secondary | ICD-10-CM | POA: Diagnosis not present

## 2019-02-17 DIAGNOSIS — E1122 Type 2 diabetes mellitus with diabetic chronic kidney disease: Secondary | ICD-10-CM | POA: Diagnosis not present

## 2019-02-17 NOTE — Progress Notes (Signed)
History of Present Illness:      This very nice 63 y.o. single BM presents for 6 month follow up with HTN, HLD, Pre-Diabetes and Vitamin D Deficiency.  Patient also has OSA on CPAP with improved sleep hygiene. Patient is on [employer] disability for Bilat Carpal tunnel syndrome.       Patient is treated for HTN (2007) & BP has been controlled at home. Today's BP was initially slightly elevated & rechecked at goal - 126/80. Patient has had no complaints of any cardiac type chest pain, palpitations, dyspnea / orthopnea / PND, dizziness, claudication, or dependent edema.      Hyperlipidemia is not controlled with diet & he relates having stopped his Crestor for c/o muscle cramps.   Current  Lipids off of meds are not at goal: Lab Results  Component Value Date   CHOL 222 (H) 02/17/2019   HDL 44 02/17/2019   LDLCALC 156 (H) 02/17/2019   TRIG 103 02/17/2019   CHOLHDL 5.0 (H) 02/17/2019       Also, the patient has Morbid Obesity (BMI 44+) and compulsive overeating / Gluttony and history of T2_NIDDM (2014) and has had no symptoms of reactive hypoglycemia, diabetic polys, paresthesias or visual blurring.  He admits not currently monitoring his CBG's. Current A1c is not at goal: Lab Results  Component Value Date   HGBA1C 6.9 (H) 02/17/2019      Further, the patient also has history of Vitamin D Deficiency and supplements vitamin D without any suspected side-effects. Last vitamin D was at goal: Lab Results  Component Value Date   VD25OH 79 02/17/2019    Current Outpatient Medications on File Prior to Visit  Medication Sig  . Ascorbic Acid (VITA-C PO) Take by mouth.  Marland Kitchen aspirin EC 81 MG tablet Take 81 mg by mouth daily.  . Blood Glucose Monitoring Suppl (ACCU-CHEK GUIDE) w/Device KIT USE TO CHECK BLOOD SUGAR ONE TIME DAILY  . Cholecalciferol (VITAMIN D-3) 5000 UNITS TABS Take 1 tablet by mouth daily.  Marland Kitchen gabapentin (NEURONTIN) 100 MG capsule TAKE 1 CAPSULE THREE TIMES DAILY  FOR  NEUROPATHY  PAINS IN  HANDS (Patient taking differently: as needed. TAKE 1 CAPSULE THREE TIMES DAILY  FOR  NEUROPATHY  PAINS IN HANDS)  . glucose blood (ACCU-CHEK GUIDE) test strip Check blood sugar 1 time a day-DX-E11.22  . hydrochlorothiazide (HYDRODIURIL) 25 MG tablet Take 1 tablet daily for BP and fluid  . losartan (COZAAR) 100 MG tablet Take 1 tablet Daily For BP  . Magnesium-Zinc (MAGNESIUM-CHELATED ZINC PO) Take 400 mg by mouth daily. Takes 1200 mg daily.  . metFORMIN (GLUCOPHAGE-XR) 500 MG 24 hr tablet TAKE 2 TABLETS TWICE DAILY FOR DIABETES  . Multiple Vitamins-Minerals (THERAGRAN-M PREMIER 50 PLUS PO) Take 1 tablet by mouth daily.  . Omega-3 Fatty Acids (FISH OIL) 500 MG CAPS Take by mouth daily.  . phentermine (ADIPEX-P) 37.5 MG tablet TAKE ONE-HALF TO ONE TABLET BY MOUTH EVERY MORNING  . sildenafil (REVATIO) 20 MG tablet TAKE 2 TO 5 TABLETS BY MOUTH DAILY AS NEEDED   No current facility-administered medications on file prior to visit.    Allergies  Allergen Reactions  . Crestor [Rosuvastatin Calcium] Other (See Comments)    Cramping   PMHx:   Past Medical History:  Diagnosis Date  . Diabetes mellitus without complication ([place])   . Hyperlipidemia, mixed 09/29/2012  . Hypertension   . Vitamin D deficiency    Immunization History  Administered Date(s) Administered  . Pneumococcal-Unspecified 02/16/2013  .  Tdap 02/10/2011   Past Surgical History:  Procedure Laterality Date  . CARPAL TUNNEL RELEASE  2008   bilateral  . CERVICAL DISCECTOMY  2003  . KNEE ARTHROSCOPY  2010   left   FHx:    Reviewed / unchanged  SHx:    Reviewed / unchanged   Systems Review:  Constitutional: Denies fever, chills, wt changes, headaches, insomnia, fatigue, night sweats, change in appetite. Eyes: Denies redness, blurred vision, diplopia, discharge, itchy, watery eyes.  ENT: Denies discharge, congestion, post nasal drip, epistaxis, sore throat, earache, hearing loss, dental pain, tinnitus, vertigo, sinus pain,  snoring.  CV: Denies chest pain, palpitations, irregular heartbeat, syncope, dyspnea, diaphoresis, orthopnea, PND, claudication or edema. Respiratory: denies cough, dyspnea, DOE, pleurisy, hoarseness, laryngitis, wheezing.  Gastrointestinal: Denies dysphagia, odynophagia, heartburn, reflux, water brash, abdominal pain or cramps, nausea, vomiting, bloating, diarrhea, constipation, hematemesis, melena, hematochezia  or hemorrhoids. Genitourinary: Denies dysuria, frequency, urgency, nocturia, hesitancy, discharge, hematuria or flank pain. Musculoskeletal: Denies arthralgias, myalgias, stiffness, jt. swelling, pain, limping or strain/sprain.  Skin: Denies pruritus, rash, hives, warts, acne, eczema or change in skin lesion(s). Neuro: No weakness, tremor, incoordination, spasms, paresthesia or pain. Psychiatric: Denies confusion, memory loss or sensory loss. Endo: Denies change in weight, skin or hair change.  Heme/Lymph: No excessive bleeding, bruising or enlarged lymph nodes.  Physical Exam  BP 126/80   Pulse 84   Temp (!) 97.3 F (36.3 C)   Resp 18   Ht '5\' 9"'  (1.753 m)   Wt 298 lb 3.2 oz (135.3 kg)   BMI 44.04 kg/m   Appears  Over nourished with central obesity. In no distress.  Eyes: PERRLA, EOMs, conjunctiva no swelling or erythema. Sinuses: No frontal/maxillary tenderness ENT/Mouth: EAC's clear, TM's nl w/o erythema, bulging. Nares clear w/o erythema, swelling, exudates. Oropharynx clear without erythema or exudates. Oral hygiene is good. Tongue normal, non obstructing. Hearing intact.  Neck: Supple. Thyroid not palpable. Car 2+/2+ without bruits, nodes or JVD. Chest: Respirations nl with BS clear & equal w/o rales, rhonchi, wheezing or stridor.  Cor: Heart sounds normal w/ regular rate and rhythm without sig. murmurs, gallops, clicks or rubs. Peripheral pulses normal and equal  without edema.  Abdomen: Soft & bowel sounds normal. Non-tender w/o guarding, rebound, hernias, masses or  organomegaly.  Lymphatics: Unremarkable.  Musculoskeletal: Full ROM all peripheral extremities, joint stability, 5/5 strength and normal gait.  Skin: Warm, dry without exposed rashes, lesions or ecchymosis apparent.  Neuro: Cranial nerves intact, reflexes equal bilaterally. Sensory-motor testing grossly intact. Tendon reflexes grossly intact.  Pysch: Alert & oriented x 3.  Insight and judgement poor. No ideations.  Assessment and Plan:  1. Essential hypertension  - Continue medication, monitor blood pressure at home.  - Continue DASH diet.  Reminder to go to the ER if any CP,  SOB, nausea, dizziness, severe HA, changes vision/speech.  - CBC with Differential/Platelet - COMPLETE METABOLIC PANEL WITH GFR - Magnesium - TSH  2. Hyperlipidemia, mixed  - Continue diet/meds, exercise,& lifestyle modifications.  - Continue monitor periodic cholesterol/liver & renal functions   - Lipid panel - TSH  3. Type 2 diabetes mellitus with stage 2 chronic kidney disease, without long-term current use of insulin (HCC)  - Continue diet, exercise  - Lifestyle modifications.  - Monitor appropriate labs.  - Hemoglobin A1c - Insulin, random  4. Vitamin D deficiency  - Continue supplementation.  - VITAMIN D 25 Hydroxyl  5. Morbid obesity (BMI 40+)  6. Medication management  - CBC  with Differential/Platelet - COMPLETE METABOLIC PANEL WITH GFR - Magnesium - Lipid panel - TSH - Hemoglobin A1c - Insulin, random - VITAMIN D 25 Hydroxyl        Discussed  regular exercise, BP monitoring, weight control to achieve/maintain BMI less than 25 and discussed med and SE's. Recommended labs to assess and monitor clinical status with further disposition pending results of labs.  I discussed the assessment and treatment plan with the patient. The patient was provided an opportunity to ask questions and all were answered. The patient agreed with the plan and demonstrated an understanding of the  instructions.  I provided over 30 minutes of exam, counseling, chart review and  complex critical decision making.   Kirtland Bouchard, MD

## 2019-02-17 NOTE — Patient Instructions (Signed)

## 2019-02-18 ENCOUNTER — Other Ambulatory Visit: Payer: Self-pay | Admitting: Internal Medicine

## 2019-02-18 ENCOUNTER — Encounter: Payer: Self-pay | Admitting: Internal Medicine

## 2019-02-18 MED ORDER — PRAVASTATIN SODIUM 10 MG PO TABS: ORAL_TABLET | ORAL | 3 refills | Status: DC

## 2019-02-21 LAB — CBC WITH DIFFERENTIAL/PLATELET
Absolute Monocytes: 671 cells/uL (ref 200–950)
Basophils Absolute: 61 cells/uL (ref 0–200)
Basophils Relative: 1 %
Eosinophils Absolute: 232 cells/uL (ref 15–500)
Eosinophils Relative: 3.8 %
HCT: 39.2 % (ref 38.5–50.0)
Hemoglobin: 12.6 g/dL — ABNORMAL LOW (ref 13.2–17.1)
Lymphs Abs: 1983 cells/uL (ref 850–3900)
MCH: 26.4 pg — ABNORMAL LOW (ref 27.0–33.0)
MCHC: 32.1 g/dL (ref 32.0–36.0)
MCV: 82.2 fL (ref 80.0–100.0)
MPV: 9.8 fL (ref 7.5–12.5)
Monocytes Relative: 11 %
Neutro Abs: 3154 cells/uL (ref 1500–7800)
Neutrophils Relative %: 51.7 %
Platelets: 347 10*3/uL (ref 140–400)
RBC: 4.77 10*6/uL (ref 4.20–5.80)
RDW: 13 % (ref 11.0–15.0)
Total Lymphocyte: 32.5 %
WBC: 6.1 10*3/uL (ref 3.8–10.8)

## 2019-02-21 LAB — COMPLETE METABOLIC PANEL WITH GFR
AG Ratio: 1.2 (calc) (ref 1.0–2.5)
ALT: 9 U/L (ref 9–46)
AST: 17 U/L (ref 10–35)
Albumin: 4.2 g/dL (ref 3.6–5.1)
Alkaline phosphatase (APISO): 63 U/L (ref 35–144)
BUN: 24 mg/dL (ref 7–25)
CO2: 26 mmol/L (ref 20–32)
Calcium: 9.6 mg/dL (ref 8.6–10.3)
Chloride: 104 mmol/L (ref 98–110)
Creat: 1.11 mg/dL (ref 0.70–1.25)
GFR, Est African American: 81 mL/min/{1.73_m2} (ref >=60)
GFR, Est Non African American: 70 mL/min/{1.73_m2} (ref >=60)
Globulin: 3.4 g/dL (calc) (ref 1.9–3.7)
Glucose, Bld: 104 mg/dL — ABNORMAL HIGH (ref 65–99)
Potassium: 4 mmol/L (ref 3.5–5.3)
Sodium: 138 mmol/L (ref 135–146)
Total Bilirubin: 0.2 mg/dL (ref 0.2–1.2)
Total Protein: 7.6 g/dL (ref 6.1–8.1)

## 2019-02-21 LAB — LIPID PANEL
Cholesterol: 222 mg/dL — ABNORMAL HIGH (ref <200)
HDL: 44 mg/dL (ref >=40)
LDL Cholesterol (Calc): 156 mg/dL (calc) — ABNORMAL HIGH
Non-HDL Cholesterol (Calc): 178 mg/dL (calc) — ABNORMAL HIGH (ref <130)
Total CHOL/HDL Ratio: 5 (calc) — ABNORMAL HIGH (ref <5.0)
Triglycerides: 103 mg/dL (ref <150)

## 2019-02-21 LAB — INSULIN, RANDOM: Insulin: 45.3 u[IU]/mL — ABNORMAL HIGH

## 2019-02-21 LAB — HEMOGLOBIN A1C
Hgb A1c MFr Bld: 6.9 % of total Hgb — ABNORMAL HIGH (ref <5.7)
Mean Plasma Glucose: 151 (calc)
eAG (mmol/L): 8.4 (calc)

## 2019-02-21 LAB — VITAMIN D 25 HYDROXY (VIT D DEFICIENCY, FRACTURES): Vit D, 25-Hydroxy: 79 ng/mL (ref 30–100)

## 2019-02-21 LAB — MAGNESIUM: Magnesium: 1.8 mg/dL (ref 1.5–2.5)

## 2019-02-21 LAB — TSH: TSH: 1.8 mIU/L (ref 0.40–4.50)

## 2019-04-06 ENCOUNTER — Other Ambulatory Visit: Payer: Self-pay | Admitting: Internal Medicine

## 2019-04-15 ENCOUNTER — Other Ambulatory Visit: Payer: Self-pay | Admitting: Adult Health

## 2019-04-15 DIAGNOSIS — I10 Essential (primary) hypertension: Secondary | ICD-10-CM

## 2019-04-23 ENCOUNTER — Other Ambulatory Visit: Payer: Self-pay | Admitting: Internal Medicine

## 2019-05-24 ENCOUNTER — Ambulatory Visit: Payer: Medicare HMO | Admitting: Adult Health Nurse Practitioner

## 2019-05-24 ENCOUNTER — Ambulatory Visit: Payer: Self-pay | Admitting: Adult Health

## 2019-05-25 NOTE — Progress Notes (Signed)
MEDICARE ANNUAL WELLNESS VISIT AND FOLLOW UP Assessment:   [name] was seen today for follow-up and medicare wellness.  Diagnoses and all orders for this visit:  Encounter for Medicare annual wellness exam Yearly  Essential hypertension Continue medication: Cozaar 171m, HCTZ 347mdaily Monitor blood pressure at home; call if consistently over 130/80 Continue DASH diet.   Reminder to go to the ER if any CP, SOB, nausea, dizziness, severe HA, changes vision/speech, left arm numbness and tingling and jaw pain. -     CBC with Differential/Platelet -     COMPLETE METABOLIC PANEL WITH GFR  Hyperlipidemia, mixed Trial of pravachol 1054mwo days a week, adverse side effects myalgias.  Has not been taking for three weeks. -     Lipid panel Discussed alternatives for patient Zetia, nexlatol?  Type 2 diabetes mellitus with stage 2 chronic kidney disease, without long-term current use of insulin (HCC) Taking Metformin 500m57mo tablets twice a day Not checking blood glucose, discussed this at length with patien -     Hemoglobin A1c Discussed dietary and exercise modifications  Vitamin D deficiency Continue supplementation 5,000IU daily -Vitamin D (Defciency)  Gastroesophageal reflux disease, unspecified whether esophagitis present Doing well Monitoring diet and avoiding triggers Increase water intake  Morbid obesity with BMI of 40.0-44.9, adult (HCC) Discussed dietary and exercise modifications at length with patient.  CKD stage 2 due to type 2 diabetes mellitus (HCC) Continue glucose monitoring Continue medications Increase fluids Avoid NSAIDS Discussed dietary modifications and exercise Will continue to monitor  Erectile dysfunction associated with type 2 diabetes mellitus (HCC) -     sildenafil (VIAGRA) 100 MG tablet; Take 1 tablet (100 mg total) by mouth as needed for erectile dysfunction. Discussed PRN use, was taking daily without desired effect Discussed medication and  side effects  Aneurysm artery, iliac common (HCC) Control blood pressure, cholesterol, glucose, increase exercise.   Medication management Continued  Advanced care planning/counseling discussion Information packets provided Patient to review information  Over 40 minutes of interview exam, counseling, chart review, and critical decision making was performed  Future Appointments  Date Time Provider DepaWhat Cheer11/2021 10:00 AM McKeUnk Pinto [facility] None  06/05/2020  9:00 AM McClGarnet Sierras [facility] None     Plan:   During the course of the visit the patient was educated and counseled about appropriate screening and preventive services including:    Pneumococcal vaccine   Influenza vaccine  Prevnar 13  Td vaccine  Screening electrocardiogram  Colorectal cancer screening  Diabetes screening  Glaucoma screening  Nutrition counseling    Subjective:  [name] GERVASIa 63 y28. AAM who presents for Medicare Annual Wellness Visit and 3 month follow up for HTN, HLD, DMII with CKD, and vitamin D Def. He also has obstructive sleep apnea and on CPAP.  He is on disability for bilateral carpel tunnel syndrome. He is taking care of his father, moved him in with him. He has retired from [employer] is prescribed phentermine for weight loss but has restarted this, half a tablet daily in morning. He denies palpitations, anxiety, trouble sleeping, elevated BP. He does recall benefit when taking previously.  BMI is Body mass index is 44.6 kg/m., he is currently working on diet and exercise, would like to get down to 185 lb.  Wt Readings from Last 3 Encounters:  05/26/19 (!) 302 lb (137 kg)  02/17/19 298 lb 3.2 oz (135.3 kg)  08/06/18 (!) 302 lb 3.2 oz (137.1  kg)   His HTN predates 2007.  He has not recently been checking BPs at home, today their BP is BP: 124/80 He does workout, cutting grass, riding bike until recent knee pains.  He denies chest pain,  shortness of breath, dizziness.    He is not on cholesterol medication (rosuvastatin but had myalgias, tolerated simvastatin 40 mg daily previously) and having myalgias.  He was taking thipravastatin two days a week and started having muscle spasms in his legs and back from this.  He has stopped the medication.  He continues to take  Fish oil daily. His cholesterol is not at goal. The cholesterol last visit was:   Lab Results  Component Value Date   CHOL 222 (H) 02/17/2019   HDL 44 02/17/2019   LDLCALC 156 (H) 02/17/2019   TRIG 103 02/17/2019   CHOLHDL 5.0 (H) 02/17/2019   He has been working on diet and exercise for Diabetes controlled by metformin with diabetic chronic kidney disease and with diabetic polyneuropathy, he is on gabapentin which is helping, he is on bASA, he is on ACE/ARB, and denies polydipsia, polyuria and visual disturbances. He has not been check fasting glucose. Last A1C was:  Lab Results  Component Value Date   HGBA1C 6.9 (H) 02/17/2019   Last GFR Lab Results  Component Value Date   GFRAA 81 02/17/2019   Patient is on Vitamin D supplement.  Last visit he was at goal. Lab Results  Component Value Date   VD25OH 79 02/17/2019        Medication Review: Current Outpatient Medications on File Prior to Visit  Medication Sig Dispense Refill  . Ascorbic Acid (VITA-C PO) Take by mouth.    Marland Kitchen aspirin EC 81 MG tablet Take 81 mg by mouth daily.    . Blood Glucose Monitoring Suppl (ACCU-CHEK GUIDE) w/Device KIT USE TO CHECK BLOOD SUGAR ONE TIME DAILY 1 kit 0  . Cholecalciferol (VITAMIN D-3) 5000 UNITS TABS Take 1 tablet by mouth daily.    Marland Kitchen glucose blood (ACCU-CHEK GUIDE) test strip Check blood sugar 1 time a day-DX-E11.22 100 each 4  . hydrochlorothiazide (HYDRODIURIL) 25 MG tablet Take 1 tablet Daily for BP & Fluid 90 tablet 3  . losartan (COZAAR) 100 MG tablet Take 1 tablet Daily For BP 90 tablet 1  . Magnesium-Zinc (MAGNESIUM-CHELATED ZINC PO) Take 400 mg by mouth  daily. Takes 1200 mg daily.    . metFORMIN (GLUCOPHAGE-XR) 500 MG 24 hr tablet Take 2 tablets 2 x /day with Meals for Diabetes 360 tablet 3  . Multiple Vitamins-Minerals (THERAGRAN-M PREMIER 50 PLUS PO) Take 1 tablet by mouth daily.    . Omega-3 Fatty Acids (FISH OIL) 500 MG CAPS Take by mouth daily.    . phentermine (ADIPEX-P) 37.5 MG tablet Take 1 tablet every Morning for Dieting & Weight Loss 90 tablet 1  . sildenafil (REVATIO) 20 MG tablet TAKE 2 TO 5 TABLETS BY MOUTH DAILY AS NEEDED 31 tablet PRN   No current facility-administered medications on file prior to visit.     Allergies: Allergies  Allergen Reactions  . Crestor [Rosuvastatin Calcium] Other (See Comments)    Cramping    Current Problems (verified) has HTN (hypertension); Hyperlipidemia associated with type 2 diabetes mellitus ([place]); GERD (gastroesophageal reflux disease); Morbid obesity (BMI 40+); Vitamin D deficiency; T2_NIDDM w/ Stage 2 CKD (GFR 77  ml/min); Medication management; Aneurysm artery, iliac common ([place]); Chronic pain syndrome; Peripheral sensory neuropathy due to T2_NIDDM; Erectile dysfunction associated with type  2 diabetes mellitus ([place]); Chronic lower back pain; Gluttony; Arthritis of right knee; CKD stage 2 due to type 2 diabetes mellitus ([place]); and Enrolled in chronic care management on their problem list.  Screening Tests Immunization History  Administered Date(s) Administered  . Pneumococcal-Unspecified 02/16/2013  . Tdap 02/10/2011   Preventative care: Last colonoscopy: 2013, due 2023 Ct head 11/2014 US aorta 2015  Prior vaccinations: TD or Tdap: 01/2011  Influenza: Declined  Pneumococcal: 02/2013 Prevnar13: Declined until 65 Shingles/Zostavax: Declined  Names of Other Physician/Practitioners you currently use: 1. [facility] here for primary care 2. Dr. Herbert Deaner, eye doctor, last visit 05/2019 - scheduled 3. Does not have a dentist- reminded to  schedule  Patient Care Team: Unk Pinto, MD as PCP - General (Internal Medicine) Sable Feil, MD as Consulting Physician (Gastroenterology) Monna Fam, MD as Consulting Physician (Ophthalmology) Elsie Saas, MD as Consulting Physician (Orthopedic Surgery) Leeroy Cha, MD as Consulting Physician (Neurosurgery) Gean Birchwood, DPM as Consulting Physician (Podiatry)  Surgical: He  has a past surgical history that includes Carpal tunnel release (2008); Knee arthroscopy (2010); and Cervical discectomy (2003). Family His family history includes Diabetes in his father, mother, and sister; Heart disease in his mother; Hypertension in his father; Kidney disease in his sister; Stroke in his mother. Social history  He reports that he quit smoking about 28 years ago. He has never used smokeless tobacco. He reports previous alcohol use. He reports that he does not use drugs.  MEDICARE WELLNESS OBJECTIVES: Physical activity: Current Exercise Habits: Home exercise routine, Type of exercise: walking;Other - see comments, Time (Minutes): 20, Frequency (Times/Week): 4, Weekly Exercise (Minutes/Week): 80, Intensity: Mild, Exercise limited by: orthopedic condition(s) Cardiac risk factors: Cardiac Risk Factors include: advanced age (>69mn, >>79women);dyslipidemia;hypertension;male gender;obesity (BMI >30kg/m2) Depression/mood screen:   Depression screen [place]/9 05/26/2019  Decreased Interest 0  Down, Depressed, Hopeless 0  PHQ - 2 Score 0    ADLs:  In your present state of health, do you have any difficulty performing the following activities: 05/26/2019 02/18/2019  Hearing? N N  Vision? N N  Difficulty concentrating or making decisions? N N  Walking or climbing stairs? N N  Dressing or bathing? N N  Doing errands, shopping? N N  Preparing Food and eating ? N -  Using the Toilet? N -  In the past six months, have you accidently leaked urine? N -  Do you have problems with  loss of bowel control? N -  Managing your Medications? N -  Managing your Finances? N -  Housekeeping or managing your Housekeeping? N -  Some recent data might be hidden     Cognitive Testing  Alert? Yes  Normal Appearance?Yes  Oriented to person? Yes  Place? Yes   Time? Yes  Recall of three objects?  Yes  Can perform simple calculations? Yes  Displays appropriate judgment?Yes  Can read the correct time from a watch face?Yes  EOL planning: Does Patient Have a Medical Advance Directive?: No Type of Advance Directive: Healthcare Power of Attorney, Living will [place] Chart?: No - copy requested Would patient like information on creating a medical advance directive?: Yes (MAU/Ambulatory/Procedural Areas - Information given)   Do you have any new medications? Current Outpatient Medications on File Prior to Visit  Medication Sig  . Ascorbic Acid (VITA-C PO) Take by mouth.  .Marland Kitchenaspirin EC 81 MG tablet Take 81 mg by mouth daily.  .Marland Kitchen  Blood Glucose Monitoring Suppl (ACCU-CHEK GUIDE) w/Device KIT USE TO CHECK BLOOD SUGAR ONE TIME DAILY  . Cholecalciferol (VITAMIN D-3) 5000 UNITS TABS Take 1 tablet by mouth daily.  Marland Kitchen glucose blood (ACCU-CHEK GUIDE) test strip Check blood sugar 1 time a day-DX-E11.22  . hydrochlorothiazide (HYDRODIURIL) 25 MG tablet Take 1 tablet Daily for BP & Fluid  . losartan (COZAAR) 100 MG tablet Take 1 tablet Daily For BP  . Magnesium-Zinc (MAGNESIUM-CHELATED ZINC PO) Take 400 mg by mouth daily. Takes 1200 mg daily.  . metFORMIN (GLUCOPHAGE-XR) 500 MG 24 hr tablet Take 2 tablets 2 x /day with Meals for Diabetes  . Multiple Vitamins-Minerals (THERAGRAN-M PREMIER 50 PLUS PO) Take 1 tablet by mouth daily.  . Omega-3 Fatty Acids (FISH OIL) 500 MG CAPS Take by mouth daily.  . phentermine (ADIPEX-P) 37.5 MG tablet Take 1 tablet every Morning for Dieting & Weight Loss  . sildenafil (REVATIO) 20 MG tablet TAKE 2 TO 5 TABLETS BY MOUTH DAILY AS  NEEDED   No current facility-administered medications on file prior to visit.      Objective:   Today's Vitals   05/26/19 0909  BP: 124/80  Pulse: 98  Temp: (!) 97.5 F (36.4 C)  SpO2: 98%  Weight: (!) 302 lb (137 kg)  Height: '5\' 9"'  (1.753 m)   Body mass index is 44.6 kg/m.  General Appearance: Morbidly obese, in no apparent distress. Eyes: PERRLA, EOMs, conjunctiva no swelling or erythema Sinuses: No Frontal/maxillary tenderness ENT/Mouth: Ext aud canals clear, TMs without erythema, bulging. No erythema, swelling, or exudate on post pharynx.  Tonsils not swollen or erythematous. Hearing normal.  Neck: Supple, thyroid normal.  Respiratory: Respiratory effort normal, BS equal bilaterally without rales, rhonchi, wheezing or stridor.  Cardio: RRR with no MRGs. Diminished peripheral pulses without edema.  Abdomen: Soft, obese, + BS.  Non tender, no guarding, rebound, hernias, masses. Lymphatics: Non tender without lymphadenopathy.  Musculoskeletal: Full ROM except right knee,wearing brace.  Normal gait Skin: Warm, dry without rashes, lesions, ecchymosis.  Neuro: Cranial nerves intact. No cerebellar symptoms.  Psych: Awake and oriented X 3, normal affect, Insight and Judgment appropriate.   Medicare Attestation I have personally reviewed: The patient's medical and social history Their use of alcohol, tobacco or illicit drugs Their current medications and supplements The patient's functional ability including ADLs,fall risks, home safety risks, cognitive, and hearing and visual impairment Diet and physical activities Evidence for depression or mood disorders  The patient's weight, height, BMI, and visual acuity have been recorded in the chart.  I have made referrals, counseling, and provided education to the patient based on review of the above and I have provided the patient with a written personalized care plan for preventive services.     Garnet Sierras, NP [facility] 05/26/2019  9:00 AM

## 2019-05-25 NOTE — Patient Instructions (Addendum)
We will call you with your lab results in 1-3 days  Be sure to check your blood glucose fasting in the mornings.  We have sent in a prescription for sildenafil Dante Gang)  Sildenafil tablets (Erectile Dysfunction) What is this medicine? SILDENAFIL (sil DEN a fil) is used to treat erection problems in men. This medicine may be used for other purposes; ask your health care provider or pharmacist if you have questions. COMMON BRAND NAME(S): Viagra What should I tell my health care provider before I take this medicine? They need to know if you have any of these conditions:  bleeding disorders  eye or vision problems, including a rare inherited eye disease called retinitis pigmentosa  anatomical deformation of the penis, Peyronie's disease, or history of priapism (painful and prolonged erection)  heart disease, angina, a history of heart attack, irregular heart beats, or other heart problems  high or low blood pressure  history of blood diseases, like sickle cell anemia or leukemia  history of stomach bleeding  kidney disease  liver disease  stroke  an unusual or allergic reaction to sildenafil, other medicines, foods, dyes, or preservatives  pregnant or trying to get pregnant  breast-feeding How should I use this medicine? Take this medicine by mouth with a glass of water. Follow the directions on the prescription label. The dose is usually taken 1 hour before sexual activity. You should not take the dose more than once per day. Do not take your medicine more often than directed. Talk to your pediatrician regarding the use of this medicine in children. This medicine is not used in children for this condition. Overdosage: If you think you have taken too much of this medicine contact a poison control center or emergency room at once. NOTE: This medicine is only for you. Do not share this medicine with others. What if I miss a dose? This does not apply. Do not take double or  extra doses. What may interact with this medicine? Do not take this medicine with any of the following medications:  cisapride  nitrates like amyl nitrite, isosorbide dinitrate, isosorbide mononitrate, nitroglycerin  riociguat This medicine may also interact with the following medications:  antiviral medicines for HIV or AIDS  bosentan  certain medicines for benign prostatic hyperplasia (BPH)  certain medicines for blood pressure  certain medicines for fungal infections like ketoconazole and itraconazole  cimetidine  erythromycin  rifampin This list may not describe all possible interactions. Give your health care provider a list of all the medicines, herbs, non-prescription drugs, or dietary supplements you use. Also tell them if you smoke, drink alcohol, or use illegal drugs. Some items may interact with your medicine. What should I watch for while using this medicine? If you notice any changes in your vision while taking this drug, call your doctor or health care professional as soon as possible. Stop using this medicine and call your health care provider right away if you have a loss of sight in one or both eyes. Contact your doctor or health care professional right away if you have an erection that lasts longer than 4 hours or if it becomes painful. This may be a sign of a serious problem and must be treated right away to prevent permanent damage. If you experience symptoms of nausea, dizziness, chest pain or arm pain upon initiation of sexual activity after taking this medicine, you should refrain from further activity and call your doctor or health care professional as soon as possible. Do not drink  alcohol to excess (examples, 5 glasses of wine or 5 shots of whiskey) when taking this medicine. When taken in excess, alcohol can increase your chances of getting a headache or getting dizzy, increasing your heart rate or lowering your blood pressure. Using this medicine does not  protect you or your partner against HIV infection (the virus that causes AIDS) or other sexually transmitted diseases. What side effects may I notice from receiving this medicine? Side effects that you should report to your doctor or health care professional as soon as possible:  allergic reactions like skin rash, itching or hives, swelling of the face, lips, or tongue  breathing problems  changes in hearing  changes in vision  chest pain  fast, irregular heartbeat  prolonged or painful erection  seizures Side effects that usually do not require medical attention (report to your doctor or health care professional if they continue or are bothersome):  back pain  dizziness  flushing  headache  indigestion  muscle aches  nausea  stuffy or runny nose This list may not describe all possible side effects. Call your doctor for medical advice about side effects. You may report side effects to FDA at 1-800-FDA-1088. Where should I keep my medicine? Keep out of reach of children. Store at room temperature between 15 and 30 degrees C (59 and 86 degrees F). Throw away any unused medicine after the expiration date. NOTE: This sheet is a summary. It may not cover all possible information. If you have questions about this medicine, talk to your doctor, pharmacist, or health care provider.  2020 Elsevier/Gold Standard (2015-07-18 12:00:25)     Ask insurance and pharmacy about shingrix - new vaccine   Can go to [employer]com.cyhttps://www.cdc.gov/vaccines/vpd/shingles/public/shingrix/index.html for more information  Shingrix Vaccination  Two vaccines are licensed and recommended to prevent shingles in the [place] Zoster vaccine live (ZVL, Zostavax) has been in use since 2006. Recombinant zoster vaccine (RZV, Shingrix), has been in use since 2017 and is recommended by [employer] as the preferred shingles vaccine.  What Everyone Should Know about Shingles Vaccine (Shingrix) One of the Recommended  Vaccines by Disease Shingles vaccination is the only way to protect against shingles and postherpetic neuralgia (PHN), the most common complication from shingles. CDC recommends that healthy adults 50 years and older get two doses of the shingles vaccine called Shingrix (recombinant zoster vaccine), separated by 2 to 6 months, to prevent shingles and the complications from the disease. Your doctor or pharmacist can give you Shingrix as a shot in your upper arm. Shingrix provides strong protection against shingles and PHN. Two doses of Shingrix is more than 90% effective at preventing shingles and PHN. Protection stays above 85% for at least the first four years after you get vaccinated. Shingrix is the preferred vaccine, over Zostavax (zoster vaccine live), a shingles vaccine in use since 2006. Zostavax may still be used to prevent shingles in healthy adults 60 years and older. For example, you could use Zostavax if a person is allergic to Shingrix, prefers Zostavax, or requests immediate vaccination and Shingrix is unavailable. Who Should Get Shingrix? Healthy adults 50 years and older should get two doses of Shingrix, separated by 2 to 6 months. You should get Shingrix even if in the past you . had shingles  . received Zostavax  . are not sure if you had chickenpox There is no maximum age for getting Shingrix. If you had shingles in the past, you can get Shingrix to help prevent future occurrences of the  disease. There is no specific length of time that you need to wait after having shingles before you can receive Shingrix, but generally you should make sure the shingles rash has gone away before getting vaccinated. You can get Shingrix whether or not you remember having had chickenpox in the past. Studies show that more than 99% of Americans 40 years and older have had chickenpox, even if they don't remember having the disease. Chickenpox and shingles are related because they are caused by the same  virus (varicella zoster virus). After a person recovers from chickenpox, the virus stays dormant (inactive) in the body. It can reactivate years later and cause shingles. If you had Zostavax in the recent past, you should wait at least eight weeks before getting Shingrix. Talk to your healthcare provider to determine the best time to get Shingrix. Shingrix is available in [facility] and pharmacies. To find doctor's offices or pharmacies near you that offer the vaccine, visit [facility] Vaccine FinderExternal. If you have questions about Shingrix, talk with your healthcare provider. Vaccine for Those 16 Years and Older  Shingrix reduces the risk of shingles and PHN by more than 90% in people 87 and older. CDC recommends the vaccine for healthy adults 80 and older.  Who Should Not Get Shingrix? You should not get Shingrix if you: . have ever had a severe allergic reaction to any component of the vaccine or after a dose of Shingrix  . tested negative for immunity to varicella zoster virus. If you test negative, you should get chickenpox vaccine.  . currently have shingles  . currently are pregnant or breastfeeding. Women who are pregnant or breastfeeding should wait to get Shingrix.  Marland Kitchen receive specific antiviral drugs (acyclovir, famciclovir, or valacyclovir) 24 hours before vaccination (avoid use of these antiviral drugs for 14 days after vaccination)- zoster vaccine live only If you have a minor acute (starts suddenly) illness, such as a cold, you may get Shingrix. But if you have a moderate or severe acute illness, you should usually wait until you recover before getting the vaccine. This includes anyone with a temperature of 101.12F or higher. The side effects of the Shingrix are temporary, and usually last 2 to 3 days. While you may experience pain for a few days after getting Shingrix, the pain will be less severe than having shingles and the complications from the disease. How Well Does  Shingrix Work? Two doses of Shingrix provides strong protection against shingles and postherpetic neuralgia (PHN), the most common complication of shingles. . In adults 65 to 63 years old who got two doses, Shingrix was 97% effective in preventing shingles; among adults 70 years and older, Shingrix was 91% effective.  . In adults 72 to 63 years old who got two doses, Shingrix was 91% effective in preventing PHN; among adults 70 years and older, Shingrix was 89% effective. Shingrix protection remained high (more than 85%) in people 70 years and older throughout the four years following vaccination. Since your risk of shingles and PHN increases as you get older, it is important to have strong protection against shingles in your older years. Top of Page  What Are the Possible Side Effects of Shingrix? Studies show that Shingrix is safe. The vaccine helps your body create a strong defense against shingles. As a result, you are likely to have temporary side effects from getting the shots. The side effects may affect your ability to do normal daily activities for 2 to 3 days. Most people got  a sore arm with mild or moderate pain after getting Shingrix, and some also had redness and swelling where they got the shot. Some people felt tired, had muscle pain, a headache, shivering, fever, stomach pain, or nausea. About 1 out of 6 people who got Shingrix experienced side effects that prevented them from doing regular activities. Symptoms went away on their own in about 2 to 3 days. Side effects were more common in younger people. You might have a reaction to the first or second dose of Shingrix, or both doses. If you experience side effects, you may choose to take over-the-counter pain medicine such as ibuprofen or acetaminophen. If you experience side effects from Shingrix, you should report them to the Vaccine Adverse Event Reporting System (VAERS). Your doctor might file this report, or you can do it yourself  through the VAERS websiteExternal, or by calling [phone]. If you have any questions about side effects from Shingrix, talk with your doctor. The shingles vaccine does not contain thimerosal (a preservative containing mercury). Top of Page  When Should I See a Doctor Because of the Side Effects I Experience From Shingrix? In clinical trials, Shingrix was not associated with serious adverse events. In fact, serious side effects from vaccines are extremely rare. For example, for every 1 million doses of a vaccine given, only one or two people may have a severe allergic reaction. Signs of an allergic reaction happen within minutes or hours after vaccination and include hives, swelling of the face and throat, difficulty breathing, a fast heartbeat, dizziness, or weakness. If you experience these or any other life-threatening symptoms, see a doctor right away. Shingrix causes a strong response in your immune system, so it may produce short-term side effects more intense than you are used to from other vaccines. These side effects can be uncomfortable, but they are expected and usually go away on their own in 2 or 3 days. Top of Page  How Can I Pay For Shingrix? There are several ways shingles vaccine may be paid for: Medicare . Medicare Part D plans cover the shingles vaccine, but there may be a cost to you depending on your plan. There may be a copay for the vaccine, or you may need to pay in full then get reimbursed for a certain amount.  . Medicare Part B does not cover the shingles vaccine. Medicaid . Medicaid may or may not cover the vaccine. Contact your insurer to find out. Private health insurance . Many private health insurance plans will cover the vaccine, but there may be a cost to you depending on your plan. Contact your insurer to find out. Vaccine assistance programs . Some pharmaceutical companies provide vaccines to eligible adults who cannot afford them. You may want to check with  the vaccine manufacturer, [employer], about Shingrix. If you do not currently have health insurance, learn more about affordable health coverage optionsExternal. To find doctor's offices or pharmacies near you that offer the vaccine, visit HealthMap Vaccine FinderExternal.

## 2019-05-26 ENCOUNTER — Encounter: Payer: Self-pay | Admitting: Adult Health Nurse Practitioner

## 2019-05-26 ENCOUNTER — Other Ambulatory Visit: Payer: Self-pay

## 2019-05-26 ENCOUNTER — Ambulatory Visit (INDEPENDENT_AMBULATORY_CARE_PROVIDER_SITE_OTHER): Payer: Medicare HMO | Admitting: Adult Health Nurse Practitioner

## 2019-05-26 VITALS — BP 124/80 | HR 98 | Temp 97.5°F | Ht 69.0 in | Wt 302.0 lb

## 2019-05-26 DIAGNOSIS — E559 Vitamin D deficiency, unspecified: Secondary | ICD-10-CM | POA: Diagnosis not present

## 2019-05-26 DIAGNOSIS — Z79899 Other long term (current) drug therapy: Secondary | ICD-10-CM | POA: Diagnosis not present

## 2019-05-26 DIAGNOSIS — E1169 Type 2 diabetes mellitus with other specified complication: Secondary | ICD-10-CM | POA: Diagnosis not present

## 2019-05-26 DIAGNOSIS — Z7189 Other specified counseling: Secondary | ICD-10-CM

## 2019-05-26 DIAGNOSIS — Z0001 Encounter for general adult medical examination with abnormal findings: Secondary | ICD-10-CM | POA: Diagnosis not present

## 2019-05-26 DIAGNOSIS — E1122 Type 2 diabetes mellitus with diabetic chronic kidney disease: Secondary | ICD-10-CM | POA: Diagnosis not present

## 2019-05-26 DIAGNOSIS — R6889 Other general symptoms and signs: Secondary | ICD-10-CM

## 2019-05-26 DIAGNOSIS — Z6841 Body Mass Index (BMI) 40.0 and over, adult: Secondary | ICD-10-CM

## 2019-05-26 DIAGNOSIS — E782 Mixed hyperlipidemia: Secondary | ICD-10-CM | POA: Diagnosis not present

## 2019-05-26 DIAGNOSIS — K219 Gastro-esophageal reflux disease without esophagitis: Secondary | ICD-10-CM | POA: Diagnosis not present

## 2019-05-26 DIAGNOSIS — N182 Chronic kidney disease, stage 2 (mild): Secondary | ICD-10-CM

## 2019-05-26 DIAGNOSIS — I10 Essential (primary) hypertension: Secondary | ICD-10-CM | POA: Diagnosis not present

## 2019-05-26 DIAGNOSIS — I723 Aneurysm of iliac artery: Secondary | ICD-10-CM

## 2019-05-26 DIAGNOSIS — Z0000 Encounter for general adult medical examination without abnormal findings: Secondary | ICD-10-CM

## 2019-05-26 DIAGNOSIS — N521 Erectile dysfunction due to diseases classified elsewhere: Secondary | ICD-10-CM

## 2019-05-26 DIAGNOSIS — E1142 Type 2 diabetes mellitus with diabetic polyneuropathy: Secondary | ICD-10-CM | POA: Diagnosis not present

## 2019-05-26 MED ORDER — SILDENAFIL CITRATE 100 MG PO TABS: 100.0000 mg | ORAL_TABLET | ORAL | 1 refills | Status: DC | PRN

## 2019-05-27 LAB — COMPLETE METABOLIC PANEL WITH GFR
AG Ratio: 1.2 (calc) (ref 1.0–2.5)
ALT: 9 U/L (ref 9–46)
AST: 19 U/L (ref 10–35)
Albumin: 4.4 g/dL (ref 3.6–5.1)
Alkaline phosphatase (APISO): 73 U/L (ref 35–144)
BUN: 21 mg/dL (ref 7–25)
CO2: 28 mmol/L (ref 20–32)
Calcium: 10.2 mg/dL (ref 8.6–10.3)
Chloride: 101 mmol/L (ref 98–110)
Creat: 1.04 mg/dL (ref 0.70–1.25)
GFR, Est African American: 88 mL/min/{1.73_m2} (ref >=60)
GFR, Est Non African American: 76 mL/min/{1.73_m2} (ref >=60)
Globulin: 3.7 g/dL (calc) (ref 1.9–3.7)
Glucose, Bld: 180 mg/dL — ABNORMAL HIGH (ref 65–99)
Potassium: 4.4 mmol/L (ref 3.5–5.3)
Sodium: 137 mmol/L (ref 135–146)
Total Bilirubin: 0.4 mg/dL (ref 0.2–1.2)
Total Protein: 8.1 g/dL (ref 6.1–8.1)

## 2019-05-27 LAB — LIPID PANEL
Cholesterol: 232 mg/dL — ABNORMAL HIGH (ref <200)
HDL: 43 mg/dL (ref >=40)
LDL Cholesterol (Calc): 173 mg/dL (calc) — ABNORMAL HIGH
Non-HDL Cholesterol (Calc): 189 mg/dL (calc) — ABNORMAL HIGH (ref <130)
Total CHOL/HDL Ratio: 5.4 (calc) — ABNORMAL HIGH (ref <5.0)
Triglycerides: 65 mg/dL (ref <150)

## 2019-05-27 LAB — CBC WITH DIFFERENTIAL/PLATELET
Absolute Monocytes: 550 cells/uL (ref 200–950)
Basophils Absolute: 52 cells/uL (ref 0–200)
Basophils Relative: 1.1 %
Eosinophils Absolute: 230 cells/uL (ref 15–500)
Eosinophils Relative: 4.9 %
HCT: 42.3 % (ref 38.5–50.0)
Hemoglobin: 13.4 g/dL (ref 13.2–17.1)
Lymphs Abs: 1631 cells/uL (ref 850–3900)
MCH: 25.9 pg — ABNORMAL LOW (ref 27.0–33.0)
MCHC: 31.7 g/dL — ABNORMAL LOW (ref 32.0–36.0)
MCV: 81.8 fL (ref 80.0–100.0)
MPV: 9.6 fL (ref 7.5–12.5)
Monocytes Relative: 11.7 %
Neutro Abs: 2237 cells/uL (ref 1500–7800)
Neutrophils Relative %: 47.6 %
Platelets: 338 10*3/uL (ref 140–400)
RBC: 5.17 10*6/uL (ref 4.20–5.80)
RDW: 12.8 % (ref 11.0–15.0)
Total Lymphocyte: 34.7 %
WBC: 4.7 10*3/uL (ref 3.8–10.8)

## 2019-05-27 LAB — HEMOGLOBIN A1C
Hgb A1c MFr Bld: 7.3 % of total Hgb — ABNORMAL HIGH (ref <5.7)
Mean Plasma Glucose: 163 (calc)
eAG (mmol/L): 9 (calc)

## 2019-06-01 ENCOUNTER — Telehealth: Payer: Self-pay

## 2019-06-01 MED ORDER — EZETIMIBE 10 MG PO TABS: ORAL_TABLET | ORAL | 11 refills | Status: DC

## 2019-06-01 NOTE — Telephone Encounter (Signed)
Zetia, 10mg , take one tablet in the evening at bedtime

## 2019-08-25 ENCOUNTER — Other Ambulatory Visit: Payer: Self-pay | Admitting: Internal Medicine

## 2019-08-25 DIAGNOSIS — I10 Essential (primary) hypertension: Secondary | ICD-10-CM

## 2019-08-25 DIAGNOSIS — E119 Type 2 diabetes mellitus without complications: Secondary | ICD-10-CM | POA: Diagnosis not present

## 2019-08-25 LAB — HM DIABETES EYE EXAM

## 2019-08-26 DIAGNOSIS — H524 Presbyopia: Secondary | ICD-10-CM | POA: Diagnosis not present

## 2019-08-26 DIAGNOSIS — H52209 Unspecified astigmatism, unspecified eye: Secondary | ICD-10-CM | POA: Diagnosis not present

## 2019-08-26 DIAGNOSIS — H5213 Myopia, bilateral: Secondary | ICD-10-CM | POA: Diagnosis not present

## 2019-08-27 ENCOUNTER — Other Ambulatory Visit: Payer: Self-pay | Admitting: Internal Medicine

## 2019-08-27 DIAGNOSIS — I10 Essential (primary) hypertension: Secondary | ICD-10-CM

## 2019-08-27 MED ORDER — LOSARTAN POTASSIUM 100 MG PO TABS: ORAL_TABLET | ORAL | 1 refills | Status: DC

## 2019-08-29 ENCOUNTER — Ambulatory Visit (INDEPENDENT_AMBULATORY_CARE_PROVIDER_SITE_OTHER): Payer: Medicare HMO | Admitting: Internal Medicine

## 2019-08-29 ENCOUNTER — Encounter: Payer: Self-pay | Admitting: Internal Medicine

## 2019-08-29 ENCOUNTER — Other Ambulatory Visit: Payer: Self-pay

## 2019-08-29 VITALS — BP 136/86 | HR 84 | Temp 97.3°F | Resp 18 | Ht 68.5 in | Wt 278.0 lb

## 2019-08-29 DIAGNOSIS — Z0000 Encounter for general adult medical examination without abnormal findings: Secondary | ICD-10-CM

## 2019-08-29 DIAGNOSIS — N182 Chronic kidney disease, stage 2 (mild): Secondary | ICD-10-CM

## 2019-08-29 DIAGNOSIS — Z79899 Other long term (current) drug therapy: Secondary | ICD-10-CM

## 2019-08-29 DIAGNOSIS — I723 Aneurysm of iliac artery: Secondary | ICD-10-CM

## 2019-08-29 DIAGNOSIS — E1142 Type 2 diabetes mellitus with diabetic polyneuropathy: Secondary | ICD-10-CM

## 2019-08-29 DIAGNOSIS — Z8249 Family history of ischemic heart disease and other diseases of the circulatory system: Secondary | ICD-10-CM | POA: Diagnosis not present

## 2019-08-29 DIAGNOSIS — Z136 Encounter for screening for cardiovascular disorders: Secondary | ICD-10-CM

## 2019-08-29 DIAGNOSIS — I10 Essential (primary) hypertension: Secondary | ICD-10-CM

## 2019-08-29 DIAGNOSIS — N138 Other obstructive and reflux uropathy: Secondary | ICD-10-CM

## 2019-08-29 DIAGNOSIS — F17200 Nicotine dependence, unspecified, uncomplicated: Secondary | ICD-10-CM

## 2019-08-29 DIAGNOSIS — Z1211 Encounter for screening for malignant neoplasm of colon: Secondary | ICD-10-CM

## 2019-08-29 DIAGNOSIS — E1169 Type 2 diabetes mellitus with other specified complication: Secondary | ICD-10-CM

## 2019-08-29 DIAGNOSIS — Z125 Encounter for screening for malignant neoplasm of prostate: Secondary | ICD-10-CM

## 2019-08-29 DIAGNOSIS — E559 Vitamin D deficiency, unspecified: Secondary | ICD-10-CM

## 2019-08-29 DIAGNOSIS — E1122 Type 2 diabetes mellitus with diabetic chronic kidney disease: Secondary | ICD-10-CM

## 2019-08-29 DIAGNOSIS — N401 Enlarged prostate with lower urinary tract symptoms: Secondary | ICD-10-CM

## 2019-08-29 DIAGNOSIS — E785 Hyperlipidemia, unspecified: Secondary | ICD-10-CM

## 2019-08-29 DIAGNOSIS — Z0001 Encounter for general adult medical examination with abnormal findings: Secondary | ICD-10-CM

## 2019-08-29 NOTE — Progress Notes (Signed)
Annual  Screening/Preventative Visit  & Comprehensive Evaluation & Examination     This very nice 64 y.o. single BM  presents for a Screening /Preventative Visit & comprehensive evaluation and management of multiple medical co-morbidities.  Patient has been followed for HTN, HLD, T2_NIDDM  and Vitamin D Deficiency. Patient has hx/o OSA, but apparently never adapted to the use of the mask.      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 Patient's BP has been controlled at home.  Today's BP was initially slightly elevated and rechecked at goal - 136/86. Patient denies any cardiac symptoms as chest pain, palpitations, shortness of breath, dizziness or ankle swelling. He does have hx/o Iliac Aneursyms.      Patient's hyperlipidemia is not controlled consequent of poor diet and medication compliance (He has stopped his Zetia). Last lipids were not at goal:  Lab Results  Component Value Date   CHOL 232 (H) 05/26/2019   HDL 43 05/26/2019   LDLCALC 173 (H) 05/26/2019   TRIG 65 05/26/2019   CHOLHDL 5.4 (H) 05/26/2019      Patient has compulsive overeating or Gluttony  (BMI 41+) and hx/o T2_NIDDM since 2014 when he was hospitalized w/ non-ketotic hyperosmolar ketoacidosis, glu >1000 mg% and hemiballismus.   Patient denies reactive hypoglycemic symptoms, visual blurring, diabetic polys or paresthesias. Last A1c was not at goal:  Lab Results  Component Value Date   HGBA1C 7.3 (H) 05/26/2019       Finally, patient has history of Vitamin D Deficiency and last vitamin D was at goal:  Lab Results  Component Value Date   VD25OH 79 02/17/2019    Current Outpatient Medications on File Prior to Visit  Medication Sig  . Ascorbic Acid (VITA-C PO) Take by mouth.  Marland Kitchen aspirin EC 81 MG tablet Take 81 mg by mouth daily.  . Blood Glucose Monitoring Suppl (ACCU-CHEK GUIDE) w/Device KIT  USE TO CHECK BLOOD SUGAR ONE TIME DAILY  . Cholecalciferol (VITAMIN D-3) 5000 UNITS TABS Take 1 tablet by mouth daily.  Marland Kitchen glucose blood (ACCU-CHEK GUIDE) test strip Check blood sugar 1 time a day-DX-E11.22  . hydrochlorothiazide (HYDRODIURIL) 25 MG tablet Take 1 tablet Daily for BP & Fluid  . losartan (COZAAR) 100 MG tablet Take 1 tablet Daily for BP & Diabetic Kidney Protection  . Magnesium-Zinc (MAGNESIUM-CHELATED ZINC PO) Take 400 mg by mouth daily. Takes 1200 mg daily.  . metFORMIN (GLUCOPHAGE-XR) 500 MG 24 hr tablet Take 2 tablets 2 x /day with Meals for Diabetes  . Multiple Vitamins-Minerals (THERAGRAN-M PREMIER 50 PLUS PO) Take 1 tablet by mouth daily.  . Omega-3 Fatty Acids (FISH OIL) 500 MG CAPS Take by mouth daily.  . phentermine (ADIPEX-P) 37.5 MG tablet Take 1 tablet every Morning for Dieting & Weight Loss  . sildenafil (REVATIO) 20 MG tablet TAKE 2 TO 5 TABLETS BY MOUTH DAILY AS NEEDED  . sildenafil (VIAGRA) 100 MG tablet Take 1 tablet (100 mg total) by mouth as needed for erectile dysfunction.  Marland Kitchen ezetimibe (ZETIA) 10 MG tablet Take one tablet in the evening at bedtime. (Patient not taking: Reported on 08/29/2019)   No current facility-administered medications on file prior to visit.   Allergies  Allergen Reactions  . Crestor [Rosuvastatin Calcium] Other (See Comments)    Cramping   Past Medical History:  Diagnosis Date  . Diabetes  mellitus without complication ([place])   . Hyperlipidemia, mixed 09/29/2012  . Hypertension   . Vitamin D deficiency    Health Maintenance  Topic Date Due  . OPHTHALMOLOGY EXAM  05/26/2019  . HEMOGLOBIN A1C  11/24/2019  . FOOT EXAM  08/28/2020  . TETANUS/TDAP  02/09/2021  . COLONOSCOPY  03/10/2022  . PNEUMOCOCCAL POLYSACCHARIDE VACCINE AGE 24-64 HIGH RISK  Completed  . Hepatitis C Screening  Completed  . HIV Screening  Completed   Immunization History  Administered Date(s) Administered  . Pneumococcal-Unspecified 02/16/2013  . Tdap  02/10/2011   Last Colon - 03/10/2012 - Dr  Sharlett Iles Recc 10 yr F/U - due Aug 2023  Past Surgical History:  Procedure Laterality Date  . CARPAL TUNNEL RELEASE  2008   bilateral  . CERVICAL DISCECTOMY  2003  . KNEE ARTHROSCOPY  2010   left   Family History  Problem Relation Age of Onset  . Diabetes Father   . Hypertension Father   . Diabetes Sister   . Kidney disease Sister   . Heart disease Mother   . Stroke Mother   . Diabetes Mother   . Colon cancer Neg Hx   . Stomach cancer Neg Hx    Social History   Socioeconomic History  . Marital status: Single    Spouse name: Never married  . Number of children: None  Occupational History  . Disabled retired Retail buyer  Tobacco Use  . Smoking status: Former Smoker    Quit date: 02/25/1991    Years since quitting: 28.5  . Smokeless tobacco: Never Used  Substance and Sexual Activity  . Alcohol use: Not Currently  . Drug use: No  . Sexual activity: Not on file     ROS Constitutional: Denies fever, chills, weight loss/gain, headaches, insomnia,  night sweats or change in appetite. Does c/o fatigue. Eyes: Denies redness, blurred vision, diplopia, discharge, itchy or watery eyes.  ENT: Denies discharge, congestion, post nasal drip, epistaxis, sore throat, earache, hearing loss, dental pain, Tinnitus, Vertigo, Sinus pain or snoring.  Cardio: Denies chest pain, palpitations, irregular heartbeat, syncope, dyspnea, diaphoresis, orthopnea, PND, claudication or edema Respiratory: denies cough, dyspnea, DOE, pleurisy, hoarseness, laryngitis or wheezing.  Gastrointestinal: Denies dysphagia, heartburn, reflux, water brash, pain, cramps, nausea, vomiting, bloating, diarrhea, constipation, hematemesis, melena, hematochezia, jaundice or hemorrhoids Genitourinary: Denies dysuria, frequency,  discharge, hematuria or flank pain. Has urgency, nocturia x 2-3 & occasional hesitancy. Musculoskeletal: Denies arthralgia, myalgia, stiffness, Jt. Swelling,  pain, limp or strain/sprain. Denies Falls. Skin: Denies puritis, rash, hives, warts, acne, eczema or change in skin lesion Neuro: No weakness, tremor, incoordination, spasms, paresthesia or pain Psychiatric: Denies confusion, memory loss or sensory loss. Denies Depression. Endocrine: Denies change in weight, skin, hair change, nocturia, and paresthesia, diabetic polys, visual blurring or hyper / hypo glycemic episodes.  Heme/Lymph: No excessive bleeding, bruising or enlarged lymph nodes.  Physical Exam  BP 136/86   Pulse 84   Temp (!) 97.3 F (36.3 C)   Resp 18   Ht 5' 8.5" (1.74 m)   Wt 278 lb (126.1 kg)   BMI 41.65 kg/m   General Appearance: Over nourished and well groomed and in no apparent distress.  Eyes: PERRLA, EOMs, conjunctiva no swelling or erythema, normal fundi and vessels. Sinuses: No frontal/maxillary tenderness ENT/Mouth: EACs patent / TMs  nl. Nares clear without erythema, swelling, mucoid exudates. Oral hygiene is good. No erythema, swelling, or exudate. Tongue normal, non-obstructing. Tonsils not swollen or erythematous. Hearing normal.  Neck: Supple,  thyroid not palpable. No bruits, nodes or JVD. Respiratory: Respiratory effort normal.  BS equal and clear bilateral without rales, rhonci, wheezing or stridor. Cardio: Heart sounds are normal with regular rate and rhythm and no murmurs, rubs or gallops. Peripheral pulses are normal and equal bilaterally without edema. No aortic or femoral bruits. Chest: symmetric with normal excursions and percussion.  Abdomen: Soft, rotund with Nl bowel sounds. Nontender, no guarding, rebound, hernias, masses, or organomegaly.  Lymphatics: Non tender without lymphadenopathy.  Musculoskeletal: Full ROM all peripheral extremities, joint stability, 5/5 strength, and normal gait. Skin: Warm and dry without rashes, lesions, cyanosis, clubbing or  ecchymosis.  Neuro: Cranial nerves intact, reflexes equal bilaterally. Normal muscle tone, no  cerebellar symptoms. Sensation intact to touch, vibratory and Monofilament to the toes bilaterally.  Pysch: Alert and oriented X 3 with normal affect, insight and judgment appropriate.   Assessment and Plan  1. Annual Preventative/Screening Exam   2. Essential hypertension  - EKG 12-Lead - [place], retroperitnl abd,  ltd - Urinalysis, Routine w reflex microscopic - Microalbumin / Creatinine Urine Ratio - COMPLETE METABOLIC PANEL WITH GFR - Magnesium - TSH - CBC with Diff  3. Hyperlipidemia associated with type 2 diabetes mellitus (HCC)  - EKG 12-Lead - [place], retroperitnl abd,  ltd - Lipid Profile - TSH  4. Type 2 diabetes mellitus with stage 2 chronic kidney disease, without long-term current use of insulin (HCC)  - EKG 12-Lead - [place], retroperitnl abd,  ltd - Urinalysis, Routine w reflex microscopic - Microalbumin / Creatinine Urine Ratio - Hemoglobin A1c ([facility]) - Insulin, random  5. Vitamin D deficiency  - Vitamin D (25 hydroxy)  6. Peripheral sensory neuropathy due to T2_NIDDM  - HM DIABETES FOOT EXAM - LOW EXTREMITY NEUR EXAM DOCUM - Hemoglobin A1c ([facility]) - Insulin, random  7. BPH with obstruction/lower urinary tract symptoms  - PSA  8. Prostate cancer screening  - PSA  9. Morbid obesity with BMI of 40.0-44.9, adult ([place])   10. Screening for colorectal cancer  - POC Hemoccult Bld/Stl  11. Screening for ischemic heart disease  - EKG 12-Lead - [place], retroperitnl abd,  ltd  12. FHx: heart disease  - EKG 12-Lead - [place], retroperitnl abd,  ltd  13. Smoker  - EKG 12-Lead - [place], retroperitnl abd,  ltd  14. Screening for AAA (aortic abdominal aneurysm)  - [place], retroperitnl abd,  ltd  15. Medication management  - Urinalysis, Routine w reflex microscopic - Microalbumin / Creatinine Urine Ratio - COMPLETE METABOLIC PANEL WITH GFR - Magnesium - Lipid Profile - TSH - Hemoglobin A1c ([facility]) - Insulin, random - Vitamin D (25 hydroxy) - CBC with  Diff  16. Aneurysm artery, iliac common ([place])            Patient was counseled in prudent diet, weight control to achieve/maintain BMI less than 25, BP monitoring, regular exercise and medications as discussed.  Discussed med effects and SE's. Routine screening labs and tests as requested with regular follow-up as recommended. Over 40 minutes of exam, counseling, chart review and high complex critical decision making was performed   Kirtland Bouchard, MD

## 2019-08-29 NOTE — Patient Instructions (Signed)

## 2019-08-31 ENCOUNTER — Other Ambulatory Visit: Payer: Self-pay | Admitting: Internal Medicine

## 2019-08-31 ENCOUNTER — Other Ambulatory Visit: Payer: Self-pay | Admitting: *Deleted

## 2019-08-31 ENCOUNTER — Telehealth: Payer: Self-pay | Admitting: *Deleted

## 2019-08-31 DIAGNOSIS — E1122 Type 2 diabetes mellitus with diabetic chronic kidney disease: Secondary | ICD-10-CM

## 2019-08-31 DIAGNOSIS — N182 Chronic kidney disease, stage 2 (mild): Secondary | ICD-10-CM

## 2019-08-31 MED ORDER — GLIPIZIDE 5 MG PO TABS: ORAL_TABLET | ORAL | 3 refills | Status: DC

## 2019-08-31 MED ORDER — ACCU-CHEK GUIDE VI STRP: ORAL_STRIP | 4 refills | Status: DC

## 2019-08-31 NOTE — Telephone Encounter (Signed)
Patient called and reported his blood sugar was 410 this morning. An RX for Glipizide 5 mg 1/2 to 1 tablet 3 times a day with meals was sent to the patient's pharmacy by Dr Oneta Rack. Patient was advised to continue taking Metformin.

## 2019-09-01 ENCOUNTER — Encounter: Payer: Self-pay | Admitting: *Deleted

## 2019-09-05 ENCOUNTER — Other Ambulatory Visit: Payer: Self-pay | Admitting: Internal Medicine

## 2019-09-05 ENCOUNTER — Telehealth: Payer: Self-pay | Admitting: *Deleted

## 2019-09-05 MED ORDER — GLIMEPIRIDE 4 MG PO TABS: ORAL_TABLET | ORAL | 1 refills | Status: DC

## 2019-09-05 NOTE — Telephone Encounter (Signed)
Patient called and reported Glipizide is causing patient is causing cramping in his legs.  Per Dr Oneta Rack, the medication was discontinued and a new RX for Glimeperide 4 mg twice a day sent tp the patient's pharmacy. Patient is aware and encouraged to take, since the next step is insulin injections.

## 2019-09-06 ENCOUNTER — Other Ambulatory Visit: Payer: Medicare HMO

## 2019-09-06 ENCOUNTER — Other Ambulatory Visit: Payer: Self-pay

## 2019-09-06 DIAGNOSIS — N182 Chronic kidney disease, stage 2 (mild): Secondary | ICD-10-CM | POA: Diagnosis not present

## 2019-09-06 DIAGNOSIS — E1142 Type 2 diabetes mellitus with diabetic polyneuropathy: Secondary | ICD-10-CM | POA: Diagnosis not present

## 2019-09-06 DIAGNOSIS — I10 Essential (primary) hypertension: Secondary | ICD-10-CM | POA: Diagnosis not present

## 2019-09-06 DIAGNOSIS — E1169 Type 2 diabetes mellitus with other specified complication: Secondary | ICD-10-CM | POA: Diagnosis not present

## 2019-09-06 DIAGNOSIS — E559 Vitamin D deficiency, unspecified: Secondary | ICD-10-CM | POA: Diagnosis not present

## 2019-09-06 DIAGNOSIS — E1122 Type 2 diabetes mellitus with diabetic chronic kidney disease: Secondary | ICD-10-CM | POA: Diagnosis not present

## 2019-09-06 DIAGNOSIS — Z79899 Other long term (current) drug therapy: Secondary | ICD-10-CM | POA: Diagnosis not present

## 2019-09-06 DIAGNOSIS — Z125 Encounter for screening for malignant neoplasm of prostate: Secondary | ICD-10-CM | POA: Diagnosis not present

## 2019-09-06 DIAGNOSIS — E785 Hyperlipidemia, unspecified: Secondary | ICD-10-CM | POA: Diagnosis not present

## 2019-09-07 LAB — MAGNESIUM: Magnesium: 2 mg/dL (ref 1.5–2.5)

## 2019-09-07 LAB — COMPLETE METABOLIC PANEL WITH GFR
AG Ratio: 1.2 (calc) (ref 1.0–2.5)
ALT: 17 U/L (ref 9–46)
AST: 26 U/L (ref 10–35)
Albumin: 4.2 g/dL (ref 3.6–5.1)
Alkaline phosphatase (APISO): 76 U/L (ref 35–144)
BUN: 23 mg/dL (ref 7–25)
CO2: 28 mmol/L (ref 20–32)
Calcium: 10.3 mg/dL (ref 8.6–10.3)
Chloride: 98 mmol/L (ref 98–110)
Creat: 1.25 mg/dL (ref 0.70–1.25)
GFR, Est African American: 71 mL/min/{1.73_m2} (ref >=60)
GFR, Est Non African American: 61 mL/min/{1.73_m2} (ref >=60)
Globulin: 3.6 g/dL (calc) (ref 1.9–3.7)
Glucose, Bld: 393 mg/dL — ABNORMAL HIGH (ref 65–99)
Potassium: 4.6 mmol/L (ref 3.5–5.3)
Sodium: 135 mmol/L (ref 135–146)
Total Bilirubin: 0.4 mg/dL (ref 0.2–1.2)
Total Protein: 7.8 g/dL (ref 6.1–8.1)

## 2019-09-07 LAB — CBC WITH DIFFERENTIAL/PLATELET
Absolute Monocytes: 586 cells/uL (ref 200–950)
Basophils Absolute: 29 cells/uL (ref 0–200)
Basophils Relative: 0.6 %
Eosinophils Absolute: 163 cells/uL (ref 15–500)
Eosinophils Relative: 3.4 %
HCT: 41.2 % (ref 38.5–50.0)
Hemoglobin: 13.1 g/dL — ABNORMAL LOW (ref 13.2–17.1)
Lymphs Abs: 1656 cells/uL (ref 850–3900)
MCH: 25.9 pg — ABNORMAL LOW (ref 27.0–33.0)
MCHC: 31.8 g/dL — ABNORMAL LOW (ref 32.0–36.0)
MCV: 81.4 fL (ref 80.0–100.0)
MPV: 10.7 fL (ref 7.5–12.5)
Monocytes Relative: 12.2 %
Neutro Abs: 2366 cells/uL (ref 1500–7800)
Neutrophils Relative %: 49.3 %
Platelets: 331 10*3/uL (ref 140–400)
RBC: 5.06 10*6/uL (ref 4.20–5.80)
RDW: 13.3 % (ref 11.0–15.0)
Total Lymphocyte: 34.5 %
WBC: 4.8 10*3/uL (ref 3.8–10.8)

## 2019-09-07 LAB — TSH: TSH: 1.78 mIU/L (ref 0.40–4.50)

## 2019-09-07 LAB — MICROALBUMIN / CREATININE URINE RATIO
Creatinine, Urine: 53 mg/dL (ref 20–320)
Microalb Creat Ratio: 104 mcg/mg creat — ABNORMAL HIGH (ref <30)
Microalb, Ur: 5.5 mg/dL

## 2019-09-07 LAB — URINALYSIS, ROUTINE W REFLEX MICROSCOPIC
Bilirubin Urine: NEGATIVE
Hgb urine dipstick: NEGATIVE
Ketones, ur: NEGATIVE
Leukocytes,Ua: NEGATIVE
Nitrite: NEGATIVE
Protein, ur: NEGATIVE
Specific Gravity, Urine: 1.044 — ABNORMAL HIGH (ref 1.001–1.03)
pH: <=5 (ref 5.0–8.0)

## 2019-09-07 LAB — PSA: PSA: 1.2 ng/mL (ref <=4.0)

## 2019-09-07 LAB — LIPID PANEL
Cholesterol: 211 mg/dL — ABNORMAL HIGH (ref <200)
HDL: 35 mg/dL — ABNORMAL LOW (ref >=40)
LDL Cholesterol (Calc): 134 mg/dL (calc) — ABNORMAL HIGH
Non-HDL Cholesterol (Calc): 176 mg/dL (calc) — ABNORMAL HIGH (ref <130)
Total CHOL/HDL Ratio: 6 (calc) — ABNORMAL HIGH (ref <5.0)
Triglycerides: 296 mg/dL — ABNORMAL HIGH (ref <150)

## 2019-09-07 LAB — HEMOGLOBIN A1C
Hgb A1c MFr Bld: 13.3 % of total Hgb — ABNORMAL HIGH (ref <5.7)
Mean Plasma Glucose: 335 (calc)
eAG (mmol/L): 18.6 (calc)

## 2019-09-07 LAB — VITAMIN D 25 HYDROXY (VIT D DEFICIENCY, FRACTURES): Vit D, 25-Hydroxy: 69 ng/mL (ref 30–100)

## 2019-09-07 LAB — INSULIN, RANDOM: Insulin: 12.9 u[IU]/mL

## 2019-09-08 ENCOUNTER — Encounter: Payer: Self-pay | Admitting: *Deleted

## 2019-09-08 ENCOUNTER — Other Ambulatory Visit: Payer: Self-pay

## 2019-09-08 DIAGNOSIS — Z1211 Encounter for screening for malignant neoplasm of colon: Secondary | ICD-10-CM

## 2019-09-08 LAB — POC HEMOCCULT BLD/STL (HOME/3-CARD/SCREEN)
Card #2 Fecal Occult Blod, POC: NEGATIVE
Card #3 Fecal Occult Blood, POC: NEGATIVE
Fecal Occult Blood, POC: NEGATIVE

## 2019-09-12 DIAGNOSIS — Z1211 Encounter for screening for malignant neoplasm of colon: Secondary | ICD-10-CM | POA: Diagnosis not present

## 2019-09-16 ENCOUNTER — Other Ambulatory Visit: Payer: Self-pay | Admitting: Internal Medicine

## 2019-09-19 ENCOUNTER — Telehealth: Payer: Self-pay | Admitting: *Deleted

## 2019-09-19 NOTE — Telephone Encounter (Signed)
Patient called and gave a list of his blood sugars from 09/11/2019 through 09/17/2019. The reading range from 162 to 269 in the mornings and 185 to 267 in the evenings. Dr Oneta Rack recommended the patient start Novolin insulin, but the patient states his father passed away on 09-Oct-2019 and is unable to start the Novolin at this time. Dr Oneta Rack is aware.

## 2019-10-12 ENCOUNTER — Other Ambulatory Visit: Payer: Self-pay | Admitting: Internal Medicine

## 2019-10-21 ENCOUNTER — Other Ambulatory Visit: Payer: Self-pay | Admitting: Internal Medicine

## 2019-10-21 MED ORDER — TADALAFIL 20 MG PO TABS: ORAL_TABLET | ORAL | 12 refills | Status: DC

## 2019-11-28 NOTE — Progress Notes (Signed)
FOLLOW UP  Assessment and Plan:   Hypertension At goal; continue medications Monitor blood pressure at home; patient to call if consistently greater than 130/80 Continue DASH diet.   Reminder to go to the ER if any CP, SOB, nausea, dizziness, severe HA, changes vision/speech, left arm numbness and tingling and jaw pain.  Hyperlipidemia associated with T2DM ([place]) Currently above goal; reports SE with 3 statins, zetia. Hasn't tried welchol - sent in to try - start 1 tab BID, increase to 2 tabs BID if tolerating in 2 weeks LDL goal <70 Continue low cholesterol diet and exercise.  Check lipid panel.   Diabetes with diabetic chronic kidney disease and neuropathy and circulatory complications Continue medication: metformin 2000 mg daily, glimepiride 4 mg BID - atypical elevations last check, poor diet r/t father passing away Home glucose logs demonstrate mostly <130 Continue diet and exercise.  Perform daily foot/skin check, notify office of any concerning changes.  Check A1C  CKD II associated with T2DM (HCC) Increase fluids, avoid NSAIDS, monitor sugars, will monitor  Morbid obesity with co morbidities Long discussion about weight loss, diet, and exercise  Recommended diet heavy in fruits and veggies and low in animal meats, cheeses, and dairy products, appropriate calorie intake Discussed ideal weight for height  Patient has been on phentermine with benefit and no SE, will restart, close follow up Patient will work on cutting out processed foods - nabs, crackers, chips Focus on whole foods Weight once a week, goal 0.5-2 lb/week, keep log, initial weight goal <275 lb Will follow up in 3 months  Vitamin D Def At goal at last visit; continue supplementation to maintain goal of 70-100 Defer Vit D level  Continue diet and meds as discussed. Further disposition pending results of labs. Discussed med's effects and SE's.   Over 30 minutes of exam, counseling, chart review, and  critical decision making was performed.   Future Appointments  Date Time Provider [place]  12/01/2019 10:30 AM Liane Comber, NP [facility] None  03/06/2020 10:30 AM Unk Pinto, MD [facility] None  06/05/2020  9:00 AM Garnet Sierras, NP [facility] None  09/13/2020 10:00 AM Unk Pinto, MD [facility] None    ----------------------------------------------------------------------------------------------------------------------  HPI 64 y.o. male  presents for 3 month follow up on hypertension, cholesterol, diabetes, morbid obesity and vitamin D deficiency.   Father passed away earlier this year. He reports mood is doing fairly well now.   He is followed by Dr. [name] for Bil knee pain, worse L, s/p scope several years ago. Has been advised need for replacement but needs to lose weight. He uses brace and gabapentin, tylenol PRN pain.   he is prescribed phentermine for weight loss, takes 1/2 tab daily only with perceived benefit, but has been out of this medication. While on the medication they have lost 0 lbs since last visit. They deny palpitations, anxiety, trouble sleeping, elevated BP.   BMI is Body mass index is 45.55 kg/m., he is working on diet, exercise limited due to knee pain. [facility], considering [facility]obics. End goal <230 lb.  Wt Readings from Last 3 Encounters:  12/01/19 (!) 304 lb (137.9 kg)  08/29/19 278 lb (126.1 kg)  05/26/19 (!) 302 lb (137 kg)   His has not been checking BP due to out of batteries in cuff, today their BP is BP: 128/88  He does not workout. He denies chest pain, shortness of breath, dizziness.   He is on cholesterol  medication (SE with rosuvastatin, simvastatin, pravastatin, zetia) and denies myalgias. His cholesterol is not at goal. The cholesterol last visit was:   Lab Results  Component Value Date   CHOL 211 (H) 09/06/2019   HDL 35 (L) 09/06/2019   LDLCALC 134 (H) 09/06/2019   TRIG 296 (H)  09/06/2019   CHOLHDL 6.0 (H) 09/06/2019    He has been working on diet and exercise for T2 diabetes (treated by metformin 2000 mg daily, glimepiride 4 mg BID, last visit with severe A1C elevated to 13.3 - very atypical for this patient - typically in 6-7 range,  was recommended initiation of 70/30 novolin after but declined due to dealing with family death at the time, admits was eating ice cream), and denies foot ulcerations, hyperglycemia, hypoglycemia , increased appetite, nausea, polydipsia, polyuria, visual disturbances, vomiting and weight loss. Does check fasting sugar - runs 96-130s. Down from 6+ in Jan.   He does have peripheral sensory neuropathy, prescribed gabapentin 100 mg, typically takes BID Last A1C in the office was:  Lab Results  Component Value Date   HGBA1C 13.3 (H) 09/06/2019   He has CKD II associated with T2DM monitored at this office:  Lab Results  Component Value Date   GFRAA 71 09/06/2019   Patient is on Vitamin D supplement and at goal at last check:    Lab Results  Component Value Date   VD25OH 69 09/06/2019       Current Medications:  Current Outpatient Medications on File Prior to Visit  Medication Sig  . Ascorbic Acid (VITA-C PO) Take by mouth.  Marland Kitchen aspirin EC 81 MG tablet Take 81 mg by mouth daily.  . Blood Glucose Monitoring Suppl (ACCU-CHEK GUIDE) w/Device KIT USE TO CHECK BLOOD SUGAR ONE TIME DAILY  . Cholecalciferol (VITAMIN D-3) 5000 UNITS TABS Take 1 tablet by mouth daily.  Marland Kitchen gabapentin (NEURONTIN) 100 MG capsule Take 1 capsule 3 x  /day for Neuropathy Pains in Hands  . glimepiride (AMARYL) 4 MG tablet Take 1 tablet     2 x   /day with Breakfast & Supper for Diabetes  . glucose blood (ACCU-CHEK GUIDE) test strip Check blood sugar 1 time a day-[id number]  . hydrochlorothiazide (HYDRODIURIL) 25 MG tablet Take 1 tablet Daily for BP & Fluid  . losartan (COZAAR) 100 MG tablet Take 1 tablet Daily for BP & Diabetic Kidney Protection  . Magnesium-Zinc  (MAGNESIUM-CHELATED ZINC PO) Take 400 mg by mouth daily. Takes 1200 mg daily.  . metFORMIN (GLUCOPHAGE-XR) 500 MG 24 hr tablet Take 2 tablets 2 x /day with Meals for Diabetes  . Multiple Vitamins-Minerals (THERAGRAN-M PREMIER 50 PLUS PO) Take 1 tablet by mouth daily.  . Omega-3 Fatty Acids (FISH OIL) 500 MG CAPS Take by mouth daily.  . phentermine (ADIPEX-P) 37.5 MG tablet Take 1 tablet every Morning for Dieting & Weight Loss  . tadalafil (CIALIS) 20 MG tablet Take 1/2 to 1 tablet every 2 to 3 days as needed for XXXX   No current facility-administered medications on file prior to visit.     Allergies:  Allergies  Allergen Reactions  . Crestor [Rosuvastatin Calcium] Other (See Comments)    Cramping     Medical History:  Past Medical History:  Diagnosis Date  . Diabetes mellitus without complication ([place])   . Hyperlipidemia, mixed 09/29/2012  . Hypertension   . Vitamin D deficiency    Family history- Reviewed and unchanged Social history- Reviewed and unchanged   Review of Systems:  Review  of Systems  Constitutional: Negative for malaise/fatigue and weight loss.  HENT: Negative for hearing loss and tinnitus.   Eyes: Negative for blurred vision and double vision.  Respiratory: Negative for cough, shortness of breath and wheezing.   Cardiovascular: Negative for chest pain, palpitations, orthopnea, claudication and leg swelling.  Gastrointestinal: Negative for abdominal pain, blood in stool, constipation, diarrhea, heartburn, melena, nausea and vomiting.  Genitourinary: Negative.   Musculoskeletal: Positive for joint pain (bilateral knees). Negative for myalgias.  Skin: Negative for rash.  Neurological: Negative for dizziness, tingling, sensory change, weakness and headaches.  Endo/Heme/Allergies: Negative for polydipsia.  Psychiatric/Behavioral: Negative.   All other systems reviewed and are negative.   Physical Exam: BP 128/88   Pulse 95   Temp 97.9 F (36.6 C)   Wt  (!) 304 lb (137.9 kg)   SpO2 96%   BMI 45.55 kg/m  Wt Readings from Last 3 Encounters:  12/01/19 (!) 304 lb (137.9 kg)  08/29/19 278 lb (126.1 kg)  05/26/19 (!) 302 lb (137 kg)   General Appearance: Morbidly obese AA male, well dressed, in no apparent distress. Eyes: PERRLA, EOMs, conjunctiva no swelling or erythema Sinuses: No Frontal/maxillary tenderness ENT/Mouth: Ext aud canals clear, TMs without erythema, bulging. No erythema, swelling, or exudate on post pharynx.  Tonsils not swollen or erythematous. Hearing normal.  Neck: Supple, thyroid normal.  Respiratory: Respiratory effort normal, BS equal bilaterally without rales, rhonchi, wheezing or stridor.  Cardio: RRR with no MRGs. Diminished peripheral pulses without edema.  Abdomen: Soft, obese abdomen limits exam, + BS.  Non tender, no guarding, rebound, palpable hernias, masses. Lymphatics: Non tender without lymphadenopathy.  Musculoskeletal: Full ROM except left knee,wearing brace, no effusion. Mildly antalgic gait Skin: Warm, dry without rashes, lesions, ecchymosis.  Neuro: Cranial nerves intact. No cerebellar symptoms.  Psych: Awake and oriented X 3, normal affect, Insight and Judgment appropriate.    [name], NP 10:06 AM [facility]

## 2019-12-01 ENCOUNTER — Ambulatory Visit (INDEPENDENT_AMBULATORY_CARE_PROVIDER_SITE_OTHER): Payer: Medicare HMO | Admitting: Adult Health

## 2019-12-01 ENCOUNTER — Other Ambulatory Visit: Payer: Self-pay

## 2019-12-01 ENCOUNTER — Encounter: Payer: Self-pay | Admitting: Adult Health

## 2019-12-01 VITALS — BP 128/88 | HR 95 | Temp 97.9°F | Wt 304.0 lb

## 2019-12-01 DIAGNOSIS — I10 Essential (primary) hypertension: Secondary | ICD-10-CM | POA: Diagnosis not present

## 2019-12-01 DIAGNOSIS — I723 Aneurysm of iliac artery: Secondary | ICD-10-CM

## 2019-12-01 DIAGNOSIS — E559 Vitamin D deficiency, unspecified: Secondary | ICD-10-CM

## 2019-12-01 DIAGNOSIS — E785 Hyperlipidemia, unspecified: Secondary | ICD-10-CM | POA: Diagnosis not present

## 2019-12-01 DIAGNOSIS — Z79899 Other long term (current) drug therapy: Secondary | ICD-10-CM

## 2019-12-01 DIAGNOSIS — E1169 Type 2 diabetes mellitus with other specified complication: Secondary | ICD-10-CM | POA: Diagnosis not present

## 2019-12-01 DIAGNOSIS — N182 Chronic kidney disease, stage 2 (mild): Secondary | ICD-10-CM | POA: Diagnosis not present

## 2019-12-01 DIAGNOSIS — E1165 Type 2 diabetes mellitus with hyperglycemia: Secondary | ICD-10-CM | POA: Diagnosis not present

## 2019-12-01 DIAGNOSIS — N521 Erectile dysfunction due to diseases classified elsewhere: Secondary | ICD-10-CM

## 2019-12-01 DIAGNOSIS — G894 Chronic pain syndrome: Secondary | ICD-10-CM

## 2019-12-01 DIAGNOSIS — E1142 Type 2 diabetes mellitus with diabetic polyneuropathy: Secondary | ICD-10-CM | POA: Diagnosis not present

## 2019-12-01 DIAGNOSIS — E1122 Type 2 diabetes mellitus with diabetic chronic kidney disease: Secondary | ICD-10-CM | POA: Diagnosis not present

## 2019-12-01 MED ORDER — COLESEVELAM HCL 625 MG PO TABS: ORAL_TABLET | ORAL | 0 refills | Status: DC

## 2019-12-01 MED ORDER — PHENTERMINE HCL 37.5 MG PO TABS: ORAL_TABLET | ORAL | 0 refills | Status: DC

## 2019-12-01 NOTE — Patient Instructions (Addendum)
Goals    . Blood Pressure < 130/80     Check blood pressure daily and keep a log    . HEMOGLOBIN A1C < 7.0     Check fasting glucose daily and keep a log (goal <130)    . LDL CALC < 70    . Weight (lb) < 275 lb (124.7 kg)        Please get the covid 19 vaccine - pfizer or moderna   Tylenol can take [phone] mg up to three times a day as needed for pain- use lowest necessary dose   Try stevia instead of splenda - helps with insulin sensitivity and weight loss   Look into water aerobics   Mostly fresh vegetables -   Avoid packed foods - nabs, crackers, chips, ice cream, bread, pasta  Stick to non starchy veggies, berries, melon, beans, nuts, seeds Small portions of sweet potato, brown rice, whole oats, quinoa, farro, etc whole grains      Bad carbs also include fruit juice, alcohol, and sweet tea. These are empty calories that do not signal to your brain that you are full.   Please remember the good carbs are still carbs which convert into sugar. So please measure them out no more than 1/2-1 cup of rice, oatmeal, pasta, and beans  Veggies are however free foods! Pile them on.   Not all fruit is created equal. Please see the list below, the fruit at the bottom is higher in sugars than the fruit at the top. Please avoid all dried fruits.       A great goal to work towards is aiming to get in a serving daily of some of the most nutritionally dense foods - G- BOMBS daily         Colesevelam tablets What is this medicine? COLESEVELAM (koh le SEV e lam) is used to lower cholesterol in patients who are at risk of heart disease or stroke. This medicine is only for patients whose cholesterol level is not controlled by diet. It is also used in combination with diet and exercise to help lower blood sugar in adults with type 2 diabetes. This medicine may be used for other purposes; ask your health care provider or pharmacist if you have questions. COMMON BRAND NAME(S):  WelChol What should I tell my health care provider before I take this medicine? They need to know if you have any of these conditions:  constipation or bowel obstruction  high triglyceride levels  history of pancreatitis caused by high triglyceride levels  an unusual or allergic reaction to colesevelam, other medicines, foods, dyes, or preservatives  pregnant or trying to get pregnant  breast-feeding How should I use this medicine? Take this medicine by mouth with at least 4 ounces (half a glass) of water. Follow the directions on the prescription label. Take with food. Take your medicine at regular intervals. Do not take it more often than directed. Do not stop taking except on your doctor's advice. Talk to your pediatrician regarding the use of this medicine in children. Special care may be needed. Because of the tablet size, it is recommended that children use the oral suspension. Overdosage: If you think you have taken too much of this medicine contact a poison control center or emergency room at once. NOTE: This medicine is only for you. Do not share this medicine with others. What if I miss a dose? If you miss a dose, take it as soon as you can with your  next meal. If it is almost time for your next dose, take only that dose. Do not take double or extra doses. What may interact with this medicine?  birth control pills  cyclosporine  insulin  medicines for diabetes like glimepiride, glipizide, and glyburide  medicines for seizures like carbamazepine, phenobarbital, phenytoin  metformin  olmesartan  thyroid hormones  verapamil  vitamins  warfarin This list may not describe all possible interactions. Give your health care provider a list of all the medicines, herbs, non-prescription drugs, or dietary supplements you use. Also tell them if you smoke, drink alcohol, or use illegal drugs. Some items may interact with your medicine. What should I watch for while using  this medicine? Visit your doctor or health care professional for regular checks on your progress. Your blood sugar and other tests will be measured regularly. This medicine is only part of a total cholesterol or blood sugar-lowering program. Your health care professional or dietician can suggest a low-cholesterol and low-fat diet that will reduce your risk of getting heart and blood vessel disease. Avoid alcohol and smoking, and keep a proper exercise schedule. To reduce the chance of getting constipated, drink plenty of water and increase the amount of fiber in your diet. Ask your doctor or health care professional for advice if you are constipated. If you are taking this medicine for diabetes, wear a medical ID bracelet or chain, and carry a card that describes your disease and details of your medicine and dosage times. This medicine may cause a decrease in folic acid. You should make sure that you get enough folic acid while you are taking this medicine. Discuss the foods you eat and the vitamins you take with your health care professional. What side effects may I notice from receiving this medicine? Side effects that you should report to your doctor or health care professional as soon as possible:  allergic reactions like skin rash, itching or hives, swelling of the face, lips, or tongue  bloody or black, tarry stools  breathing problems  muscle pain  nausea, vomiting  severe stomach pain Side effects that usually do not require medical attention (report to your doctor or health care professional if they continue or are bothersome):  heartburn or indigestion  stomach upset This list may not describe all possible side effects. Call your doctor for medical advice about side effects. You may report side effects to FDA at 1-800-FDA-1088. Where should I keep my medicine? Keep out of the reach of children. Store at room temperature between 15 and 30 degrees C (59 and 86 degrees F). Protect  from moisture. Throw away any unused medicine after the expiration date. NOTE: This sheet is a summary. It may not cover all possible information. If you have questions about this medicine, talk to your doctor, pharmacist, or health care provider.  2020 Elsevier/Gold Standard (2017-03-23 15:44:38)

## 2019-12-02 LAB — COMPLETE METABOLIC PANEL WITH GFR
AG Ratio: 1.1 (calc) (ref 1.0–2.5)
ALT: 9 U/L (ref 9–46)
AST: 19 U/L (ref 10–35)
Albumin: 4.2 g/dL (ref 3.6–5.1)
Alkaline phosphatase (APISO): 67 U/L (ref 35–144)
BUN/Creatinine Ratio: 27 (calc) — ABNORMAL HIGH (ref 6–22)
BUN: 30 mg/dL — ABNORMAL HIGH (ref 7–25)
CO2: 28 mmol/L (ref 20–32)
Calcium: 9.7 mg/dL (ref 8.6–10.3)
Chloride: 103 mmol/L (ref 98–110)
Creat: 1.13 mg/dL (ref 0.70–1.25)
GFR, Est African American: 79 mL/min/{1.73_m2} (ref >=60)
GFR, Est Non African American: 68 mL/min/{1.73_m2} (ref >=60)
Globulin: 3.8 g/dL (calc) — ABNORMAL HIGH (ref 1.9–3.7)
Glucose, Bld: 91 mg/dL (ref 65–99)
Potassium: 4.3 mmol/L (ref 3.5–5.3)
Sodium: 139 mmol/L (ref 135–146)
Total Bilirubin: 0.3 mg/dL (ref 0.2–1.2)
Total Protein: 8 g/dL (ref 6.1–8.1)

## 2019-12-02 LAB — CBC WITH DIFFERENTIAL/PLATELET
Absolute Monocytes: 655 cells/uL (ref 200–950)
Basophils Absolute: 52 cells/uL (ref 0–200)
Basophils Relative: 0.9 %
Eosinophils Absolute: 278 cells/uL (ref 15–500)
Eosinophils Relative: 4.8 %
HCT: 40.4 % (ref 38.5–50.0)
Hemoglobin: 12.7 g/dL — ABNORMAL LOW (ref 13.2–17.1)
Lymphs Abs: 2163 cells/uL (ref 850–3900)
MCH: 26.2 pg — ABNORMAL LOW (ref 27.0–33.0)
MCHC: 31.4 g/dL — ABNORMAL LOW (ref 32.0–36.0)
MCV: 83.5 fL (ref 80.0–100.0)
MPV: 9.6 fL (ref 7.5–12.5)
Monocytes Relative: 11.3 %
Neutro Abs: 2651 cells/uL (ref 1500–7800)
Neutrophils Relative %: 45.7 %
Platelets: 342 10*3/uL (ref 140–400)
RBC: 4.84 10*6/uL (ref 4.20–5.80)
RDW: 12.8 % (ref 11.0–15.0)
Total Lymphocyte: 37.3 %
WBC: 5.8 10*3/uL (ref 3.8–10.8)

## 2019-12-02 LAB — HEMOGLOBIN A1C
Hgb A1c MFr Bld: 7.8 % of total Hgb — ABNORMAL HIGH (ref <5.7)
Mean Plasma Glucose: 177 (calc)
eAG (mmol/L): 9.8 (calc)

## 2019-12-02 LAB — LIPID PANEL
Cholesterol: 213 mg/dL — ABNORMAL HIGH (ref <200)
HDL: 50 mg/dL (ref >=40)
LDL Cholesterol (Calc): 140 mg/dL (calc) — ABNORMAL HIGH
Non-HDL Cholesterol (Calc): 163 mg/dL (calc) — ABNORMAL HIGH (ref <130)
Total CHOL/HDL Ratio: 4.3 (calc) (ref <5.0)
Triglycerides: 114 mg/dL (ref <150)

## 2019-12-02 LAB — MAGNESIUM: Magnesium: 2.1 mg/dL (ref 1.5–2.5)

## 2019-12-02 LAB — TSH: TSH: 1.59 mIU/L (ref 0.40–4.50)

## 2020-02-21 ENCOUNTER — Other Ambulatory Visit: Payer: Self-pay

## 2020-02-21 ENCOUNTER — Other Ambulatory Visit: Payer: Self-pay | Admitting: Adult Health

## 2020-02-21 MED ORDER — COLESEVELAM HCL 625 MG PO TABS: ORAL_TABLET | ORAL | 0 refills | Status: DC

## 2020-02-28 ENCOUNTER — Other Ambulatory Visit: Payer: Self-pay | Admitting: Internal Medicine

## 2020-02-29 ENCOUNTER — Other Ambulatory Visit: Payer: Self-pay | Admitting: Internal Medicine

## 2020-03-05 NOTE — Progress Notes (Signed)
History of Present Illness:       This very nice 64 y.o.  Single BM presents for 6 month follow up with HTN, HLD, Pre-Diabetes and Vitamin D Deficiency.  Patient has hx/o OSA with alleged mask intolerance.  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 Today's BP is at goal - 126/82. Patient has had no complaints of any cardiac type chest pain, palpitations, dyspnea / orthopnea / PND, dizziness, claudication, or dependent edema.       Patient has hx/o Statin Intolerance and his Hyperlipidemia is not controlled with Welchol  (& he had stopped his Zetia).  Patient denies myalgias or other med SE's. Last Lipids were not at goal:  Lab Results  Component Value Date   CHOL 213 (H) 12/01/2019   HDL 50 12/01/2019   LDLCALC 140 (H) 12/01/2019   TRIG 114 12/01/2019   CHOLHDL 4.3 12/01/2019    Also, the patient has Morbid Obesity (BMI 45.9+) and consequent history of poorly controlled T2_NIDDM (2014) due to poor dietary compliance and compulsive Gluttony.  Patient denies symptoms of reactive hypoglycemia, diabetic polys, paresthesias or visual blurring.  Last A1c was not at goal:  Lab Results  Component Value Date   HGBA1C 7.8 (H) 12/01/2019           Further, the patient also has history of Vitamin D Deficiency and supplements vitamin D without any suspected side-effects. Last vitamin D was at goal:  Lab Results  Component Value Date   VD25OH 69 09/06/2019    Current Outpatient Medications on File Prior to Visit  Medication Sig  . Ascorbic Acid (VITA-C PO) Take by mouth.  Marland Kitchen aspirin EC 81 MG tablet Take 81 mg by mouth daily.  . Blood Glucose Monitoring Suppl (ACCU-CHEK GUIDE) w/Device KIT USE TO CHECK BLOOD SUGAR ONE TIME DAILY  . Cholecalciferol (VITAMIN D-3) 5000 UNITS TABS Take 1 tablet by mouth daily.  .  colesevelam (WELCHOL) 625 MG tablet Start by taking 1 tab twice daily with meals for cholesterol. If tolerating in 2 weeks increase to taking 2 tabs twice daily.  Marland Kitchen gabapentin (NEURONTIN) 100 MG capsule Take 1 capsule 3 x  /day for Neuropathy Pains in Hands  . glimepiride (AMARYL) 4 MG tablet Take 1 tablet 2 x /day with Breakfast & Supper for Diabetes  . glucose blood (ACCU-CHEK GUIDE) test strip Check blood sugar 1 time a day-[id number]  . hydrochlorothiazide (HYDRODIURIL) 25 MG tablet Take 1 tablet Daily for BP & Fluid  . losartan (COZAAR) 100 MG tablet Take 1 tablet Daily for BP & Diabetic Kidney Protection  . Magnesium-Zinc (MAGNESIUM-CHELATED ZINC PO) Take 400 mg by mouth daily. Takes 1200 mg daily.  . metFORMIN (GLUCOPHAGE-XR) 500 MG 24 hr tablet Take 2 tablets 2 x /day with Meals for Diabetes  . Multiple Vitamins-Minerals (THERAGRAN-M PREMIER 50 PLUS PO) Take 1 tablet by mouth daily.  . Omega-3 Fatty Acids (FISH OIL) 500 MG CAPS Take by mouth daily.  . phentermine (ADIPEX-P) 37.5 MG tablet TAKE ONE TABLET BY MOUTH EVERY MORNING FOR DIETING AND WEIGHT LOSS  . tadalafil (CIALIS) 20 MG tablet Take 1/2 to 1 tablet every 2 to 3 days as needed for XXXX   No current facility-administered medications on file prior to visit.  Allergies  Allergen Reactions  . Crestor [Rosuvastatin Calcium] Other (See Comments)    Cramping    PMHx:   Past Medical History:  Diagnosis Date  . Diabetes mellitus without complication ([place])   . Hyperlipidemia, mixed 09/29/2012  . Hypertension   . Vitamin D deficiency     Immunization History  Administered Date(s) Administered  . Pneumococcal-Unspecified 02/16/2013  . Tdap 02/10/2011    Past Surgical History:  Procedure Laterality Date  . CARPAL TUNNEL RELEASE  2008   bilateral  . CERVICAL DISCECTOMY  2003  . KNEE ARTHROSCOPY  2010   left    FHx:    Reviewed / unchanged  SHx:    Reviewed / unchanged   Systems Review:  Constitutional: Denies  fever, chills, wt changes, headaches, insomnia, fatigue, night sweats, change in appetite. Eyes: Denies redness, blurred vision, diplopia, discharge, itchy, watery eyes.  ENT: Denies discharge, congestion, post nasal drip, epistaxis, sore throat, earache, hearing loss, dental pain, tinnitus, vertigo, sinus pain, snoring.  CV: Denies chest pain, palpitations, irregular heartbeat, syncope, dyspnea, diaphoresis, orthopnea, PND, claudication or edema. Respiratory: denies cough, dyspnea, DOE, pleurisy, hoarseness, laryngitis, wheezing.  Gastrointestinal: Denies dysphagia, odynophagia, heartburn, reflux, water brash, abdominal pain or cramps, nausea, vomiting, bloating, diarrhea, constipation, hematemesis, melena, hematochezia  or hemorrhoids. Genitourinary: Denies dysuria, frequency, urgency, nocturia, hesitancy, discharge, hematuria or flank pain. Musculoskeletal: Denies arthralgias, myalgias, stiffness, jt. swelling, pain, limping or strain/sprain.  Skin: Denies pruritus, rash, hives, warts, acne, eczema or change in skin lesion(s). Neuro: No weakness, tremor, incoordination, spasms, paresthesia or pain. Psychiatric: Denies confusion, memory loss or sensory loss. Endo: Denies change in weight, skin or hair change.  Heme/Lymph: No excessive bleeding, bruising or enlarged lymph nodes.  Physical Exam  BP 126/82   Pulse 76   Temp (!) 97.5 F (36.4 C)   Resp 18   Ht 5' 8.5" (1.74 m)   Wt (!) 306 lb 6.4 oz (139 kg)   BMI 45.91 kg/m   Appears  over nourished  and in no distress.  Eyes: PERRLA, EOMs, conjunctiva no swelling or erythema. Sinuses: No frontal/maxillary tenderness ENT/Mouth: EAC's clear, TM's nl w/o erythema, bulging. Nares clear w/o erythema, swelling, exudates. Oropharynx clear without erythema or exudates. Oral hygiene is good. Tongue normal, non obstructing. Hearing intact.  Neck: Supple. Thyroid not palpable. Car 2+/2+ without bruits, nodes or JVD. Chest: Respirations nl with  BS clear & equal w/o rales, rhonchi, wheezing or stridor.  Cor: Heart sounds normal w/ regular rate and rhythm without sig. murmurs, gallops, clicks or rubs. Peripheral pulses normal and equal  without edema.  Abdomen: Soft & bowel sounds normal. Non-tender w/o guarding, rebound, hernias, masses or organomegaly.  Lymphatics: Unremarkable.  Musculoskeletal: Full ROM all peripheral extremities, joint stability, 5/5 strength and normal gait.  Skin: Warm, dry without exposed rashes, lesions or ecchymosis apparent.  Neuro: Cranial nerves intact, reflexes equal bilaterally. Sensory-motor testing grossly intact. Tendon reflexes grossly intact.  Pysch: Alert & oriented x 3.  Insight and judgement nl & appropriate. No ideations.  Assessment and Plan:  1. Essential hypertension  - Continue medication, monitor blood pressure at home.  - Continue DASH diet.  Reminder to go to the ER if any CP,  SOB, nausea, dizziness, severe HA, changes vision/speech.  - CBC with Differential/Platelet - COMPLETE METABOLIC PANEL WITH GFR - Magnesium - TSH  2. Hyperlipidemia associated with type 2 diabetes mellitus ([place])  - Continue diet/meds, exercise,& lifestyle modifications.  - Continue monitor periodic cholesterol/liver & renal  functions   - Lipid panel - TSH  3. Type 2 diabetes mellitus with stage 2 chronic kidney disease,  without long-term current use of insulin ([facility])  - Continue diet, exercise  - Lifestyle modifications.  - Monitor appropriate labs.  - Hemoglobin A1c - Insulin, random  4. Vitamin D deficiency  - Continue supplementation.  - VITAMIN D 25 Hydroxy  5. Uncontrolled type 2 diabetes mellitus with hyperglycemia ([facility])  - Hemoglobin A1c - Insulin, random  6. Morbid obesity (BMI 40+)  - TSH  7. Gluttony  8. Medication management  - CBC with Differential/Platelet - COMPLETE METABOLIC PANEL WITH GFR - Magnesium - Lipid panel - TSH - Hemoglobin A1c - Insulin, random -  VITAMIN D 25 Hydroxy        Discussed  regular exercise, BP monitoring, weight control to achieve/maintain BMI less than 25 and discussed med and SE's. Recommended labs to assess and monitor clinical status with further disposition pending results of labs.  I discussed the assessment and treatment plan with the patient. The patient was provided an opportunity to ask questions and all were answered. The patient agreed with the plan and demonstrated an understanding of the instructions.  I provided over 30 minutes of exam, counseling, chart review and  complex critical decision making.   Kirtland Bouchard, MD

## 2020-03-05 NOTE — Patient Instructions (Signed)
Due to recent changes in healthcare laws, you may see the results of your imaging and laboratory studies on MyChart before your provider has had a chance to review them.  We understand that in some cases there may be results that are confusing or concerning to you. Not all laboratory results come back in the same time frame and the provider may be waiting for multiple results in order to interpret others.  Please give [place] 48 hours in order for your provider to thoroughly review all the results before contacting the office for clarification of your results.   ++++++++++++++++++++++++++++++++++  Recommend the book "The END of DIETING" by Dr Monico Hoar   & the book "The END of DIABETES " by Dr Monico Hoar  At [email or web address] - get book & Audio CD's     Being diabetic has a  300% increased risk for heart attack, stroke, cancer, and alzheimer- type vascular dementia. It is very important that you work harder with diet by avoiding all foods that are white. Avoid white rice (brown & wild rice is OK), white potatoes (sweetpotatoes in moderation is OK), White bread or wheat bread or anything made out of white flour like bagels, donuts, rolls, buns, biscuits, cakes, pastries, cookies, pizza crust, and pasta (made from white flour & egg whites) - vegetarian pasta or spinach or wheat pasta is OK. Multigrain breads like Arnold's or Pepperidge Farm, or multigrain sandwich thins or flatbreads.  Diet, exercise and weight loss can reverse and cure diabetes in the early stages.  Diet, exercise and weight loss is very important in the control and prevention of complications of diabetes which affects every system in your body, ie. Brain - dementia/stroke, eyes - glaucoma/blindness, heart - heart attack/heart failure, kidneys - dialysis, stomach - gastric paralysis, intestines - malabsorption, nerves - severe painful neuritis, circulation - gangrene & loss of a leg(s), and finally cancer and Alzheimers.    I recommend avoid  fried & greasy foods,  sweets/candy, white rice (brown or wild rice or Quinoa is OK), white potatoes (sweet potatoes are OK) - anything made from white flour - bagels, doughnuts, rolls, buns, biscuits,white and wheat breads, pizza crust and traditional pasta made of white flour & egg white(vegetarian pasta or spinach or wheat pasta is OK).  Multi-grain bread is OK - like multi-grain flat bread or sandwich thins. Avoid alcohol in excess. Exercise is also important.    Eat all the vegetables you want - avoid meat, especially red meat and dairy - especially cheese.  Cheese is the most concentrated form of trans-fats which is the worst thing to clog up our arteries. Veggie cheese is OK which can be found in the fresh produce section at [employer] or [employer] or [employer]  ++++++++++++++++++++++++++++++++++++++++++++++++++++++  Vit D  & Vit C 1,000 mg   are recommended to help protect  against the Covid-19 and other Corona viruses.    Also it's recommended  to take  Zinc 50 mg  to help  protect against the Covid-19   and best place to get  is also on [email or web address].[email or web address]  and don't pay more than 6-8 cents /pill !   ========================================================= Coronavirus (COVID-19) Are you at risk?  Are you at risk for the Coronavirus (COVID-19)?  To be considered HIGH RISK for Coronavirus (COVID-19), you have to meet the following criteria:  . Traveled to [place], [place], [place], [place] or [place]; or in the [place] to [place], [place], [place]  . or [place]; and have fever, cough, and shortness of breath within the last 2 weeks of travel OR . Been in close contact with a person diagnosed with COVID-19 within the last 2 weeks and have  . fever, cough,and shortness of breath .  . IF YOU DO NOT MEET THESE CRITERIA, YOU ARE CONSIDERED LOW RISK FOR COVID-19.  What to do if you are HIGH RISK for COVID-19?  Marland Kitchen If you are having a medical emergency, call 911. . Seek  medical care right away. Before you go to a doctor's office, urgent care or emergency department, .  call ahead and tell them about your recent travel, contact with someone diagnosed with COVID-19  .  and your symptoms.  . You should receive instructions from your physician's office regarding next steps of care.  . When you arrive at healthcare provider, tell the healthcare staff immediately you have returned from  . visiting [place], [place], [place], [place] or [place]; or traveled in the [place] to [place], [place],  . [place] or [place] in the last two weeks or you have been in close contact with a person diagnosed with  . COVID-19 in the last 2 weeks.   . Tell the health care staff about your symptoms: fever, cough and shortness of breath. . After you have been seen by a medical provider, you will be either: o Tested for (COVID-19) and discharged home on quarantine except to seek medical care if  o symptoms worsen, and asked to  - Stay home and avoid contact with others until you get your results (4-5 days)  - Avoid travel on public transportation if possible (such as bus, train, or airplane) or o Sent to the Emergency Department by EMS for evaluation, COVID-19 testing  and  o possible admission depending on your condition and test results.  What to do if you are LOW RISK for COVID-19?  Reduce your risk of any infection by using the same precautions used for avoiding the common cold or flu:  Marland Kitchen Wash your hands often with soap and warm water for at least 20 seconds.  If soap and water are not readily available,  . use an alcohol-based hand sanitizer with at least 60% alcohol.  . If coughing or sneezing, cover your mouth and nose by coughing or sneezing into the elbow areas of your shirt or coat, .  into a tissue or into your sleeve (not your hands). . Avoid shaking hands with others and consider head nods or verbal greetings only. . Avoid touching your eyes, nose, or mouth  with unwashed hands.  . Avoid close contact with people who are sick. . Avoid places or events with large numbers of people in one location, like concerts or sporting events. . Carefully consider travel plans you have or are making. . If you are planning any travel outside or inside the [place], visit the CDC's Travelers' Health webpage for the latest health notices. . If you have some symptoms but not all symptoms, continue to monitor at home and seek medical attention  . if your symptoms worsen. . If you are having a medical emergency, call 911.   +++++++++++++++++++++++++++++++++++++++++++++++++++ Recommend Adult Low Dose Aspirin or  coated  Aspirin 81 mg daily  To reduce risk of Colon Cancer 40 %,  Skin Cancer 26 % ,  Melanoma 46%  and  Pancreatic cancer 60% ++++++++++++++++++++++++++++++++++++++++++++++++++++ Vitamin D goal  is between 70-100.  Please make sure that you are taking your  Vitamin D as directed.  It is very important as a natural anti-inflammatory  helping hair, skin, and nails, as well as reducing stroke and heart attack risk.  It helps your bones and helps with mood. It also decreases numerous cancer risks so please take it as directed.  Low Vit D is associated with a 200-300% higher risk for CANCER  and 200-300% higher risk for HEART   ATTACK  &  STROKE.   .....................................Marland Kitchen It is also associated with higher death rate at younger ages,  autoimmune diseases like Rheumatoid arthritis, Lupus, Multiple Sclerosis.    Also many other serious conditions, like depression, Alzheimer's Dementia, infertility, muscle aches, fatigue, fibromyalgia - just to name a few. ++++++++++++++++++++ Recommend the book "The END of DIETING" by Dr Monico Hoar  & the book "The END of DIABETES " by Dr Monico Hoar At [email or web address] - get book & Audio CD's    Being diabetic has a  300% increased risk for heart attack, stroke, cancer, and alzheimer- type vascular dementia. It  is very important that you work harder with diet by avoiding all foods that are white. Avoid white rice (brown & wild rice is OK), white potatoes (sweetpotatoes in moderation is OK), White bread or wheat bread or anything made out of white flour like bagels, donuts, rolls, buns, biscuits, cakes, pastries, cookies, pizza crust, and pasta (made from white flour & egg whites) - vegetarian pasta or spinach or wheat pasta is OK. Multigrain breads like Arnold's or Pepperidge Farm, or multigrain sandwich thins or flatbreads.  Diet, exercise and weight loss can reverse and cure diabetes in the early stages.  Diet, exercise and weight loss is very important in the control and prevention of complications of diabetes which affects every system in your body, ie. Brain - dementia/stroke, eyes - glaucoma/blindness, heart - heart attack/heart failure, kidneys - dialysis, stomach - gastric paralysis, intestines - malabsorption, nerves - severe painful neuritis, circulation - gangrene & loss of a leg(s), and finally cancer and Alzheimers.    I recommend avoid fried & greasy foods,  sweets/candy, white rice (brown or wild rice or Quinoa is OK), white potatoes (sweet potatoes are OK) - anything made from white flour - bagels, doughnuts, rolls, buns, biscuits,white and wheat breads, pizza crust and traditional pasta made of white flour & egg white(vegetarian pasta or spinach or wheat pasta is OK).  Multi-grain bread is OK - like multi-grain flat bread or sandwich thins. Avoid alcohol in excess. Exercise is also important.    Eat all the vegetables you want - avoid meat, especially red meat and dairy - especially cheese.  Cheese is the most concentrated form of trans-fats which is the worst thing to clog up our arteries. Veggie cheese is OK which can be found in the fresh produce section at [employer] or [employer] or [employer]  +++++++++++++++++++++++++++++++++++++++++++++++ DASH Eating Plan  DASH stands for "Dietary  Approaches to Stop Hypertension."   The DASH eating plan is a healthy eating plan that has been shown to reduce high blood pressure (hypertension). Additional health benefits may include reducing the risk of type 2 diabetes mellitus, heart disease, and stroke. The DASH eating plan may also help with weight loss. WHAT DO I NEED TO KNOW ABOUT THE DASH EATING PLAN? For the DASH eating plan, you will follow these general guidelines:  Choose foods with a percent daily value for sodium of less than 5% (as listed on the food label).  Use salt-free seasonings or herbs instead  of table salt or sea salt.  Check with your health care provider or pharmacist before using salt substitutes.  Eat lower-sodium products, often labeled as "lower sodium" or "no salt added."  Eat fresh foods.  Eat more vegetables, fruits, and low-fat dairy products.  Choose whole grains. Look for the word "whole" as the first word in the ingredient list.  Choose fish   Limit sweets, desserts, sugars, and sugary drinks.  Choose heart-healthy fats.  Eat veggie cheese   Eat more home-cooked food and less restaurant, buffet, and fast food.  Limit fried foods.  Cook foods using methods other than frying.  Limit canned vegetables. If you do use them, rinse them well to decrease the sodium.  When eating at a restaurant, ask that your food be prepared with less salt, or no salt if possible.                      WHAT FOODS CAN I EAT? Read Dr Francis DowseJoel Fuhrman's books on The End of Dieting & The End of Diabetes  Grains Whole grain or whole wheat bread. Brown rice. Whole grain or whole wheat pasta. Quinoa, bulgur, and whole grain cereals. Low-sodium cereals. Corn or whole wheat flour tortillas. Whole grain cornbread. Whole grain crackers. Low-sodium crackers.  Vegetables Fresh or frozen vegetables (raw, steamed, roasted, or grilled). Low-sodium or reduced-sodium tomato and vegetable juices. Low-sodium or reduced-sodium  tomato sauce and paste. Low-sodium or reduced-sodium canned vegetables.   Fruits All fresh, canned (in natural juice), or frozen fruits.  Protein Products  All fish and seafood.  Dried beans, peas, or lentils. Unsalted nuts and seeds. Unsalted canned beans.  Dairy Low-fat dairy products, such as skim or 1% milk, 2% or reduced-fat cheeses, low-fat ricotta or cottage cheese, or plain low-fat yogurt. Low-sodium or reduced-sodium cheeses.  Fats and Oils Tub margarines without trans fats. Light or reduced-fat mayonnaise and salad dressings (reduced sodium). Avocado. Safflower, olive, or canola oils. Natural peanut or almond butter.  Other Unsalted popcorn and pretzels. The items listed above may not be a complete list of recommended foods or beverages. Contact your dietitian for more options.  +++++++++++++++  WHAT FOODS ARE NOT RECOMMENDED? Grains/ White flour or wheat flour White bread. White pasta. White rice. Refined cornbread. Bagels and croissants. Crackers that contain trans fat.  Vegetables  Creamed or fried vegetables. Vegetables in a . Regular canned vegetables. Regular canned tomato sauce and paste. Regular tomato and vegetable juices.  Fruits Dried fruits. Canned fruit in light or heavy syrup. Fruit juice.  Meat and Other Protein Products Meat in general - RED meat & White meat.  Fatty cuts of meat. Ribs, chicken wings, all processed meats as bacon, sausage, bologna, salami, fatback, hot dogs, bratwurst and packaged luncheon meats.  Dairy Whole or 2% milk, cream, half-and-half, and cream cheese. Whole-fat or sweetened yogurt. Full-fat cheeses or blue cheese. Non-dairy creamers and whipped toppings. Processed cheese, cheese spreads, or cheese curds.  Condiments Onion and garlic salt, seasoned salt, table salt, and sea salt. Canned and packaged gravies. Worcestershire sauce. Tartar sauce. Barbecue sauce. Teriyaki sauce. Soy sauce, including reduced sodium. Steak sauce. Fish  sauce. Oyster sauce. Cocktail sauce. Horseradish. Ketchup and mustard. Meat flavorings and tenderizers. Bouillon cubes. Hot sauce. Tabasco sauce. Marinades. Taco seasonings. Relishes.  Fats and Oils Butter, stick margarine, lard, shortening and bacon fat. Coconut, palm kernel, or palm oils. Regular salad dressings.  Pickles and olives. Salted popcorn and pretzels.  The items listed above  may not be a complete list of foods and beverages to avoid.

## 2020-03-06 ENCOUNTER — Ambulatory Visit (INDEPENDENT_AMBULATORY_CARE_PROVIDER_SITE_OTHER): Payer: Medicare HMO | Admitting: Internal Medicine

## 2020-03-06 ENCOUNTER — Other Ambulatory Visit: Payer: Self-pay

## 2020-03-06 ENCOUNTER — Encounter: Payer: Self-pay | Admitting: Internal Medicine

## 2020-03-06 VITALS — BP 126/82 | HR 76 | Temp 97.5°F | Resp 18 | Ht 68.5 in | Wt 306.4 lb

## 2020-03-06 DIAGNOSIS — I10 Essential (primary) hypertension: Secondary | ICD-10-CM

## 2020-03-06 DIAGNOSIS — E559 Vitamin D deficiency, unspecified: Secondary | ICD-10-CM

## 2020-03-06 DIAGNOSIS — E1169 Type 2 diabetes mellitus with other specified complication: Secondary | ICD-10-CM | POA: Diagnosis not present

## 2020-03-06 DIAGNOSIS — Z79899 Other long term (current) drug therapy: Secondary | ICD-10-CM

## 2020-03-06 DIAGNOSIS — N182 Chronic kidney disease, stage 2 (mild): Secondary | ICD-10-CM | POA: Diagnosis not present

## 2020-03-06 DIAGNOSIS — E1122 Type 2 diabetes mellitus with diabetic chronic kidney disease: Secondary | ICD-10-CM

## 2020-03-06 DIAGNOSIS — R632 Polyphagia: Secondary | ICD-10-CM | POA: Diagnosis not present

## 2020-03-06 DIAGNOSIS — E785 Hyperlipidemia, unspecified: Secondary | ICD-10-CM

## 2020-03-06 DIAGNOSIS — E1165 Type 2 diabetes mellitus with hyperglycemia: Secondary | ICD-10-CM | POA: Diagnosis not present

## 2020-03-07 ENCOUNTER — Other Ambulatory Visit: Payer: Self-pay | Admitting: Internal Medicine

## 2020-03-07 LAB — COMPLETE METABOLIC PANEL WITH GFR
AG Ratio: 1.2 (calc) (ref 1.0–2.5)
ALT: 10 U/L (ref 9–46)
AST: 19 U/L (ref 10–35)
Albumin: 4.3 g/dL (ref 3.6–5.1)
Alkaline phosphatase (APISO): 75 U/L (ref 35–144)
BUN: 19 mg/dL (ref 7–25)
CO2: 26 mmol/L (ref 20–32)
Calcium: 10.2 mg/dL (ref 8.6–10.3)
Chloride: 101 mmol/L (ref 98–110)
Creat: 1.13 mg/dL (ref 0.70–1.25)
GFR, Est African American: 79 mL/min/{1.73_m2} (ref >=60)
GFR, Est Non African American: 68 mL/min/{1.73_m2} (ref >=60)
Globulin: 3.7 g/dL (calc) (ref 1.9–3.7)
Glucose, Bld: 162 mg/dL — ABNORMAL HIGH (ref 65–99)
Potassium: 4.2 mmol/L (ref 3.5–5.3)
Sodium: 138 mmol/L (ref 135–146)
Total Bilirubin: 0.3 mg/dL (ref 0.2–1.2)
Total Protein: 8 g/dL (ref 6.1–8.1)

## 2020-03-07 LAB — HEMOGLOBIN A1C
Hgb A1c MFr Bld: 7.4 % of total Hgb — ABNORMAL HIGH (ref <5.7)
Mean Plasma Glucose: 166 (calc)
eAG (mmol/L): 9.2 (calc)

## 2020-03-07 LAB — CBC WITH DIFFERENTIAL/PLATELET
Absolute Monocytes: 530 cells/uL (ref 200–950)
Basophils Absolute: 42 cells/uL (ref 0–200)
Basophils Relative: 0.8 %
Eosinophils Absolute: 260 cells/uL (ref 15–500)
Eosinophils Relative: 4.9 %
HCT: 43.8 % (ref 38.5–50.0)
Hemoglobin: 13.7 g/dL (ref 13.2–17.1)
Lymphs Abs: 2242 cells/uL (ref 850–3900)
MCH: 25.6 pg — ABNORMAL LOW (ref 27.0–33.0)
MCHC: 31.3 g/dL — ABNORMAL LOW (ref 32.0–36.0)
MCV: 81.9 fL (ref 80.0–100.0)
MPV: 9.9 fL (ref 7.5–12.5)
Monocytes Relative: 10 %
Neutro Abs: 2226 cells/uL (ref 1500–7800)
Neutrophils Relative %: 42 %
Platelets: 348 10*3/uL (ref 140–400)
RBC: 5.35 10*6/uL (ref 4.20–5.80)
RDW: 13.4 % (ref 11.0–15.0)
Total Lymphocyte: 42.3 %
WBC: 5.3 10*3/uL (ref 3.8–10.8)

## 2020-03-07 LAB — LIPID PANEL
Cholesterol: 246 mg/dL — ABNORMAL HIGH (ref <200)
HDL: 43 mg/dL (ref >=40)
LDL Cholesterol (Calc): 172 mg/dL (calc) — ABNORMAL HIGH
Non-HDL Cholesterol (Calc): 203 mg/dL (calc) — ABNORMAL HIGH (ref <130)
Total CHOL/HDL Ratio: 5.7 (calc) — ABNORMAL HIGH (ref <5.0)
Triglycerides: 163 mg/dL — ABNORMAL HIGH (ref <150)

## 2020-03-07 LAB — INSULIN, RANDOM: Insulin: 98.2 u[IU]/mL — ABNORMAL HIGH

## 2020-03-07 LAB — TSH: TSH: 2.7 mIU/L (ref 0.40–4.50)

## 2020-03-07 LAB — VITAMIN D 25 HYDROXY (VIT D DEFICIENCY, FRACTURES): Vit D, 25-Hydroxy: 86 ng/mL (ref 30–100)

## 2020-03-07 LAB — MAGNESIUM: Magnesium: 1.9 mg/dL (ref 1.5–2.5)

## 2020-03-07 MED ORDER — EZETIMIBE 10 MG PO TABS
ORAL_TABLET | ORAL | 0 refills | Status: DC
Start: 2020-03-07 — End: 2020-03-23

## 2020-03-07 NOTE — Progress Notes (Signed)
===========================================================   -   Total Chol = 246 - very high risk   - and   - Bad / Dangerous LDL Chol = 172 - Very Very high risk   - Sent in new Rx for Zetia - Very Important to take this med to prevent heart attack, stroke or Impotence.  ===========================================================   - A1c = 7.4% - too high  (Goal is less than 6.0%)    - very important to get on a better diet & lose weight before have a Heart Attack, Stroke of Develop Dementia or Impotence   Being diabetic has a 300% increased risk for heart attack,  stroke, cancer, and alzheimer- type vascular dementia.   It is very important that you work harder with diet by  avoiding all foods that are white except chicken,   fish & calliflower.   - Avoid white rice  (brown & wild rice is OK),   - Avoid white potatoes  (sweet potatoes in moderation is OK),   White bread or wheat bread or anything made out of   white flour like bagels, donuts, rolls, buns, biscuits, cakes,   - pastries, cookies, pizza crust, and pasta (made from  white flour & egg whites)   - vegetarian pasta or spinach or wheat pasta is OK.   - Multigrain breads like Arnold's, Pepperidge Farm or   multigrain sandwich thins or high fiber breads like   [place] bread or "Dave's Killer" breads that are  4 to 5 grams fiber per slice ! are best.      - Diet, exercise and weight loss is very important in the   control and prevention of complications of diabetes which  affects every system in your body, ie.   -Brain - dementia/stroke,  - eyes - glaucoma/blindness,  - heart - heart attack/heart failure,  - kidneys - dialysis,  - stomach - gastric paralysis,  - intestines - malabsorption,  - nerves - severe painful neuritis,  - circulation - gangrene & loss of a leg(s)  - and finally . . . . . . . . . . . . . . . . . .    - cancer and Alzheimers.   ===========================================================   - All Else - CBC - Kidneys - Electrolytes - Liver - Magnesium & Thyroid    - all Normal / OK  ===========================================================

## 2020-03-23 ENCOUNTER — Other Ambulatory Visit: Payer: Self-pay | Admitting: Internal Medicine

## 2020-03-23 DIAGNOSIS — N182 Chronic kidney disease, stage 2 (mild): Secondary | ICD-10-CM

## 2020-03-23 DIAGNOSIS — E1169 Type 2 diabetes mellitus with other specified complication: Secondary | ICD-10-CM

## 2020-03-23 DIAGNOSIS — E1122 Type 2 diabetes mellitus with diabetic chronic kidney disease: Secondary | ICD-10-CM

## 2020-03-23 MED ORDER — EZETIMIBE 10 MG PO TABS: ORAL_TABLET | ORAL | 0 refills | Status: DC

## 2020-03-23 MED ORDER — GLIMEPIRIDE 4 MG PO TABS: ORAL_TABLET | ORAL | 0 refills | Status: DC

## 2020-03-26 ENCOUNTER — Other Ambulatory Visit: Payer: Self-pay | Admitting: *Deleted

## 2020-03-26 MED ORDER — TRUEPLUS LANCETS 30G MISC: 3 refills | Status: DC

## 2020-03-26 MED ORDER — TRUE METRIX BLOOD GLUCOSE TEST VI STRP: ORAL_STRIP | 3 refills | Status: DC

## 2020-03-26 MED ORDER — ALCOHOL SWABS PADS: MEDICATED_PAD | 3 refills | Status: DC

## 2020-03-30 ENCOUNTER — Other Ambulatory Visit: Payer: Self-pay | Admitting: *Deleted

## 2020-03-30 MED ORDER — TRUE METRIX LEVEL 1 LOW VI SOLN: 3 refills | Status: AC

## 2020-04-01 ENCOUNTER — Other Ambulatory Visit: Payer: Self-pay | Admitting: Internal Medicine

## 2020-04-01 DIAGNOSIS — E1122 Type 2 diabetes mellitus with diabetic chronic kidney disease: Secondary | ICD-10-CM

## 2020-04-01 DIAGNOSIS — E785 Hyperlipidemia, unspecified: Secondary | ICD-10-CM

## 2020-04-01 DIAGNOSIS — E1169 Type 2 diabetes mellitus with other specified complication: Secondary | ICD-10-CM

## 2020-04-01 DIAGNOSIS — N182 Chronic kidney disease, stage 2 (mild): Secondary | ICD-10-CM

## 2020-04-01 MED ORDER — GLIMEPIRIDE 4 MG PO TABS: ORAL_TABLET | ORAL | 0 refills | Status: DC

## 2020-04-01 MED ORDER — EZETIMIBE 10 MG PO TABS: ORAL_TABLET | ORAL | 0 refills | Status: DC

## 2020-04-02 ENCOUNTER — Other Ambulatory Visit: Payer: Self-pay | Admitting: *Deleted

## 2020-04-02 MED ORDER — TRUE METRIX METER W/DEVICE KIT: PACK | 0 refills | Status: AC

## 2020-06-04 ENCOUNTER — Other Ambulatory Visit: Payer: Self-pay | Admitting: Internal Medicine

## 2020-06-04 DIAGNOSIS — I10 Essential (primary) hypertension: Secondary | ICD-10-CM

## 2020-06-05 ENCOUNTER — Ambulatory Visit: Payer: Medicare HMO | Admitting: Adult Health Nurse Practitioner

## 2020-06-06 ENCOUNTER — Other Ambulatory Visit: Payer: Self-pay | Admitting: Internal Medicine

## 2020-06-06 DIAGNOSIS — E1169 Type 2 diabetes mellitus with other specified complication: Secondary | ICD-10-CM

## 2020-06-07 ENCOUNTER — Encounter: Payer: Self-pay | Admitting: Adult Health Nurse Practitioner

## 2020-06-07 ENCOUNTER — Ambulatory Visit (INDEPENDENT_AMBULATORY_CARE_PROVIDER_SITE_OTHER): Payer: Medicare HMO | Admitting: Adult Health Nurse Practitioner

## 2020-06-07 ENCOUNTER — Other Ambulatory Visit: Payer: Self-pay

## 2020-06-07 VITALS — BP 128/76 | HR 73 | Temp 97.5°F | Wt 307.0 lb

## 2020-06-07 DIAGNOSIS — N182 Chronic kidney disease, stage 2 (mild): Secondary | ICD-10-CM | POA: Diagnosis not present

## 2020-06-07 DIAGNOSIS — E559 Vitamin D deficiency, unspecified: Secondary | ICD-10-CM

## 2020-06-07 DIAGNOSIS — Z79899 Other long term (current) drug therapy: Secondary | ICD-10-CM

## 2020-06-07 DIAGNOSIS — N401 Enlarged prostate with lower urinary tract symptoms: Secondary | ICD-10-CM | POA: Diagnosis not present

## 2020-06-07 DIAGNOSIS — I10 Essential (primary) hypertension: Secondary | ICD-10-CM

## 2020-06-07 DIAGNOSIS — E1169 Type 2 diabetes mellitus with other specified complication: Secondary | ICD-10-CM | POA: Diagnosis not present

## 2020-06-07 DIAGNOSIS — R6889 Other general symptoms and signs: Secondary | ICD-10-CM

## 2020-06-07 DIAGNOSIS — Z0000 Encounter for general adult medical examination without abnormal findings: Secondary | ICD-10-CM

## 2020-06-07 DIAGNOSIS — E785 Hyperlipidemia, unspecified: Secondary | ICD-10-CM

## 2020-06-07 DIAGNOSIS — Z0001 Encounter for general adult medical examination with abnormal findings: Secondary | ICD-10-CM | POA: Diagnosis not present

## 2020-06-07 DIAGNOSIS — E1122 Type 2 diabetes mellitus with diabetic chronic kidney disease: Secondary | ICD-10-CM

## 2020-06-07 DIAGNOSIS — I723 Aneurysm of iliac artery: Secondary | ICD-10-CM

## 2020-06-07 DIAGNOSIS — E1142 Type 2 diabetes mellitus with diabetic polyneuropathy: Secondary | ICD-10-CM | POA: Diagnosis not present

## 2020-06-07 DIAGNOSIS — N138 Other obstructive and reflux uropathy: Secondary | ICD-10-CM

## 2020-06-07 MED ORDER — PHENTERMINE HCL 37.5 MG PO TABS: ORAL_TABLET | ORAL | 0 refills | Status: DC

## 2020-06-07 NOTE — Progress Notes (Signed)
MEDICARE ANNUAL WELLNESS VISIT AND FOLLOW UP   Assessment:   [name] [name] was seen today for follow-up and medicare wellness.  Diagnoses and all orders for this visit:  Encounter for Medicare annual wellness exam Yearly  Essential hypertension Continue medication: Cozaar 100mg , HCTZ 35mg  daily Monitor blood pressure at home; call if consistently over 130/80 Continue DASH diet.   Reminder to go to the ER if any CP, SOB, nausea, dizziness, severe HA, changes vision/speech, left arm numbness and tingling and jaw pain. -     CBC with Differential/Platelet -     COMPLETE METABOLIC PANEL WITH GFR  Hyperlipidemia, mixed Trial of pravachol 10mg  two days a week, adverse side effects myalgias.  Has not been taking for three weeks. -     Lipid panel Discussed alternatives for patient Zetia, nexlatol?  Type 2 diabetes mellitus with stage 2 chronic kidney disease, without long-term current use of insulin (HCC) Taking Metformin 500mg  two tablets twice a day Not checking blood glucose, discussed this at length with patien -     Hemoglobin A1c Discussed dietary and exercise modifications  Vitamin D deficiency Continue supplementation 5,000IU daily -Vitamin D (Defciency)  Gastroesophageal reflux disease, unspecified whether esophagitis present Doing well Monitoring diet and avoiding triggers Increase water intake  Morbid obesity with BMI of 40.0-44.9, adult (HCC) Discussed dietary and exercise modifications at length with patient.  CKD stage 2 due to type 2 diabetes mellitus (HCC) Continue glucose monitoring Continue medications Increase fluids Avoid NSAIDS Discussed dietary modifications and exercise Will continue to monitor  Erectile dysfunction associated with type 2 diabetes mellitus (HCC) -     sildenafil (VIAGRA) 100 MG tablet; Take 1 tablet (100 mg total) by mouth as needed for erectile dysfunction. Discussed PRN use, was taking daily without desired effect Discussed medication  and side effects  Aneurysm artery, iliac common (HCC) Control blood pressure, cholesterol, glucose, increase exercise.   Medication management Continued  Advanced care planning/counseling discussion Information packets provided Patient to review information   Further disposition pending results if labs check today. Discussed med's effects and SE's.   Over 40 minutes of face to face interview, exam, counseling, chart review, and critical decision making was performed.    Future Appointments  Date Time Provider [place]  09/13/2020 10:00 AM Unk Pinto, MD [facility] None  06/10/2021 10:30 AM Garnet Sierras, NP [facility] None     Plan:   During the course of the visit the patient was educated and counseled about appropriate screening and preventive services including:    Pneumococcal vaccine   Influenza vaccine  Prevnar 13  Td vaccine  Screening electrocardiogram  Colorectal cancer screening  Diabetes screening  Glaucoma screening  Nutrition counseling    Subjective:  [name] [name] is a 64 y.o. AAM who presents for Medicare Annual Wellness Visit and 3 month follow up for HTN, HLD, DMII with CKD, and vitamin D Def. He also has obstructive sleep apnea and on CPAP.  He is on disability for bilateral carpel tunnel syndrome. He is taking care of his father, moved him in with him. He has retired from [employer].  He is prescribed phentermine for weight loss but has restarted this, half a tablet daily in morning. He denies palpitations, anxiety, trouble sleeping, elevated BP. He does recall benefit when taking previously.  BMI is Body mass index is 46 kg/m., he is currently working on diet and exercise, would like to get down to 185 lb.  Wt Readings from Last 3 Encounters:  06/07/20 (!) 307 lb (139.3 kg)  03/06/20 (!) 306 lb 6.4 oz (139 kg)  12/01/19 (!) 304 lb (137.9 kg)   His HTN predates 2007.  He has not recently been checking BPs at home, today  their BP is BP: 128/76 He does workout, cutting grass, riding bike until recent knee pains.  He denies chest pain, shortness of breath, dizziness.    He is not on cholesterol medication (rosuvastatin but had myalgias, tolerated simvastatin 40 mg daily previously) and having myalgias.  He was taking thipravastatin two days a week and started having muscle spasms in his legs and back from this.  He has stopped the medication.  He continues to take  Fish oil daily. His cholesterol is not at goal. The cholesterol last visit was:   Lab Results  Component Value Date   CHOL 229 (H) 06/07/2020   HDL 45 06/07/2020   LDLCALC 158 (H) 06/07/2020   TRIG 139 06/07/2020   CHOLHDL 5.1 (H) 06/07/2020   He has been working on diet and exercise for Diabetes controlled by metformin with diabetic chronic kidney disease and with diabetic polyneuropathy, he is on gabapentin which is helping, he is on bASA, he is on ACE/ARB, and denies polydipsia, polyuria and visual disturbances. He has not been check fasting glucose. Last A1C was:  Lab Results  Component Value Date   HGBA1C 7.8 (H) 06/07/2020   Last GFR Lab Results  Component Value Date   GFRAA 78 06/07/2020   Patient is on Vitamin D supplement.  Last visit he was at goal. Lab Results  Component Value Date   VD25OH 86 03/06/2020        Medication Review: Current Outpatient Medications on File Prior to Visit  Medication Sig Dispense Refill  . Alcohol Swabs PADS Use to check blood sugar 1 time daily-E11.2 100 each 3  . Ascorbic Acid (VITA-C PO) Take by mouth.    Marland Kitchen aspirin EC 81 MG tablet Take 81 mg by mouth daily.    . Blood Glucose Calibration (TRUE METRIX LEVEL 1) Low SOLN Use with glucometer. Check blood sugar 1 time daily-E11.2. 1 each 3  . Blood Glucose Monitoring Suppl (TRUE METRIX METER) w/Device KIT Check blood sugar 1 time daily- E11.2 1 kit 0  . Cholecalciferol (VITAMIN D-3) 5000 UNITS TABS Take 1 tablet by mouth daily.    . colesevelam  (WELCHOL) 625 MG tablet Start by taking 1 tab twice daily with meals for cholesterol. If tolerating in 2 weeks increase to taking 2 tabs twice daily. 360 tablet 0  . ezetimibe (ZETIA) 10 MG tablet Take     1 tablet      Daily      for Cholesterol 90 tablet 0  . gabapentin (NEURONTIN) 100 MG capsule Take 1 capsule 3 x  /day for Neuropathy Pains in Hands 270 capsule 3  . glimepiride (AMARYL) 4 MG tablet Take 1 tablet 2 x /day with Breakfast & Supper for Diabetes 180 tablet 0  . glucose blood (TRUE METRIX BLOOD GLUCOSE TEST) test strip Check blood sugar 1 time daily-E11.2 100 each 3  . hydrochlorothiazide (HYDRODIURIL) 25 MG tablet TAKE 1 TABLET DAILY FOR BLOOD PRESSURE AND FLUID 90 tablet 3  . losartan (COZAAR) 100 MG tablet TAKE 1 TABLET DAILY FOR BLOOD PRESSURE AND DIABETIC KIDNEY PROTECTION 90 tablet 3  . Magnesium-Zinc (MAGNESIUM-CHELATED ZINC PO) Take 400 mg by mouth daily. Takes 1200 mg daily.    . metFORMIN (GLUCOPHAGE-XR) 500 MG 24 hr tablet TAKE  2 TABLETS TWICE DAILY WITH MEALS FOR DIABETES 360 tablet 3  . Multiple Vitamins-Minerals (THERAGRAN-M PREMIER 50 PLUS PO) Take 1 tablet by mouth daily.    . Omega-3 Fatty Acids (FISH OIL) 500 MG CAPS Take by mouth daily.    . tadalafil (CIALIS) 20 MG tablet Take 1/2 to 1 tablet every 2 to 3 days as needed for XXXX 30 tablet 12  . TRUEplus Lancets 30G MISC Check blood sugar 1 time daily-E11.2 100 each 3   No current facility-administered medications on file prior to visit.    Allergies: Allergies  Allergen Reactions  . Crestor [Rosuvastatin Calcium] Other (See Comments)    Cramping    Current Problems (verified) has HTN (hypertension); Hyperlipidemia associated with type 2 diabetes mellitus ([place]); GERD (gastroesophageal reflux disease); Morbid obesity (BMI 40+); Vitamin D deficiency; T2_NIDDM w/ Stage 2 CKD (GFR 77  ml/min); Medication management; Aneurysm artery, iliac common ([place]); Chronic pain syndrome; Peripheral sensory neuropathy due to  T2_NIDDM; Erectile dysfunction associated with type 2 diabetes mellitus ([place]); Chronic lower back pain; Gluttony; Arthritis of right knee; and CKD stage 2 due to type 2 diabetes mellitus ([place]) on their problem list.  Screening Tests Immunization History  Administered Date(s) Administered  . Pneumococcal-Unspecified 02/16/2013  . Tdap 02/10/2011   Preventative care: Last colonoscopy: 2013, due 2023 Ct head 11/2014 US aorta 2015   Prior vaccinations: TD or Tdap: 01/2011, Due 2022  Influenza: Declined  Pneumococcal: 02/2013 Prevnar13: Declined until 65 Shingles/Zostavax: Declined SARS-COV-Moderna Complete: 04/2020   Names of Other Physician/Practitioners you currently use: 1. [facility] here for primary care 2. Dr. Herbert Deaner, eye doctor, last visit 05/2019 - scheduled 3. Does not have a dentist- reminded to schedule  Patient Care Team: Unk Pinto, MD as PCP - General (Internal Medicine) Sable Feil, MD as Consulting Physician (Gastroenterology) Monna Fam, MD as Consulting Physician (Ophthalmology) Elsie Saas, MD as Consulting Physician (Orthopedic Surgery) Leeroy Cha, MD as Consulting Physician (Neurosurgery) Gean Birchwood, DPM as Consulting Physician (Podiatry)  Surgical: He  has a past surgical history that includes Carpal tunnel release (2008); Knee arthroscopy (2010); and Cervical discectomy (2003). Family His family history includes Diabetes in his father, mother, and sister; Heart disease in his mother; Hypertension in his father; Kidney disease in his sister; Stroke in his mother. Social history  He reports that he quit smoking about 29 years ago. He has never used smokeless tobacco. He reports previous alcohol use. He reports that he does not use drugs.  MEDICARE WELLNESS OBJECTIVES: Physical activity: Current Exercise Habits: The patient has a physically strenuous job, but has no regular exercise apart  from work., Type of exercise: Other - see comments, Time (Minutes): 30, Frequency (Times/Week): 5, Weekly Exercise (Minutes/Week): 150, Intensity: Mild, Exercise limited by: None identified Cardiac risk factors: Cardiac Risk Factors include: advanced age (>76mn, >>50women);diabetes mellitus;dyslipidemia;obesity (BMI >30kg/m2);male gender Depression/mood screen:   [facility]  Decreased Interest 0  Down, Depressed, Hopeless 0  PHQ - 2 Score 0    ADLs:  In your present state of health, do you have any difficulty performing the following activities: 06/07/2020 08/29/2019  Hearing? N N  Vision? N N  Difficulty concentrating or making decisions? N N  Walking or climbing stairs? N N  Dressing or bathing? N N  Doing errands, shopping? N N  Preparing Food and eating ? N -  Using the Toilet? N -  In the past six months, have you  accidently leaked urine? N -  Do you have problems with loss of bowel control? N -  Managing your Medications? N -  Managing your Finances? N -  Housekeeping or managing your Housekeeping? N -  Some recent data might be hidden     Cognitive Testing  Alert? Yes  Normal Appearance?Yes  Oriented to person? Yes  Place? Yes   Time? Yes  Recall of three objects?  Yes  Can perform simple calculations? Yes  Displays appropriate judgment?Yes  Can read the correct time from a watch face?Yes  EOL planning: Does Patient Have a Medical Advance Directive?: No Would patient like information on creating a medical advance directive?: No - Patient declined   Do you have any new medications? Current Outpatient Medications on File Prior to Visit  Medication Sig  . Alcohol Swabs PADS Use to check blood sugar 1 time daily-E11.2  . Ascorbic Acid (VITA-C PO) Take by mouth.  Marland Kitchen aspirin EC 81 MG tablet Take 81 mg by mouth daily.  . Blood Glucose Calibration (TRUE METRIX LEVEL 1) Low SOLN Use with glucometer. Check blood sugar 1 time daily-E11.2.  . Blood  Glucose Monitoring Suppl (TRUE METRIX METER) w/Device KIT Check blood sugar 1 time daily- E11.2  . Cholecalciferol (VITAMIN D-3) 5000 UNITS TABS Take 1 tablet by mouth daily.  . colesevelam (WELCHOL) 625 MG tablet Start by taking 1 tab twice daily with meals for cholesterol. If tolerating in 2 weeks increase to taking 2 tabs twice daily.  Marland Kitchen ezetimibe (ZETIA) 10 MG tablet Take     1 tablet      Daily      for Cholesterol  . gabapentin (NEURONTIN) 100 MG capsule Take 1 capsule 3 x  /day for Neuropathy Pains in Hands  . glimepiride (AMARYL) 4 MG tablet Take 1 tablet 2 x /day with Breakfast & Supper for Diabetes  . glucose blood (TRUE METRIX BLOOD GLUCOSE TEST) test strip Check blood sugar 1 time daily-E11.2  . hydrochlorothiazide (HYDRODIURIL) 25 MG tablet TAKE 1 TABLET DAILY FOR BLOOD PRESSURE AND FLUID  . losartan (COZAAR) 100 MG tablet TAKE 1 TABLET DAILY FOR BLOOD PRESSURE AND DIABETIC KIDNEY PROTECTION  . Magnesium-Zinc (MAGNESIUM-CHELATED ZINC PO) Take 400 mg by mouth daily. Takes 1200 mg daily.  . metFORMIN (GLUCOPHAGE-XR) 500 MG 24 hr tablet TAKE 2 TABLETS TWICE DAILY WITH MEALS FOR DIABETES  . Multiple Vitamins-Minerals (THERAGRAN-M PREMIER 50 PLUS PO) Take 1 tablet by mouth daily.  . Omega-3 Fatty Acids (FISH OIL) 500 MG CAPS Take by mouth daily.  . tadalafil (CIALIS) 20 MG tablet Take 1/2 to 1 tablet every 2 to 3 days as needed for XXXX  . TRUEplus Lancets 30G MISC Check blood sugar 1 time daily-E11.2   No current facility-administered medications on file prior to visit.     Objective:   Today's Vitals   06/07/20 1059  BP: 128/76  Pulse: 73  Temp: (!) 97.5 F (36.4 C)  SpO2: 97%  Weight: (!) 307 lb (139.3 kg)   Body mass index is 46 kg/m.  General Appearance: Morbidly obese, in no apparent distress. Eyes: PERRLA, EOMs, conjunctiva no swelling or erythema Sinuses: No Frontal/maxillary tenderness ENT/Mouth: Ext aud canals clear, TMs without erythema, bulging. No erythema,  swelling, or exudate on post pharynx.  Tonsils not swollen or erythematous. Hearing normal.  Neck: Supple, thyroid normal.  Respiratory: Respiratory effort normal, BS equal bilaterally without rales, rhonchi, wheezing or stridor.  Cardio: RRR with no MRGs. Diminished peripheral pulses without  edema.  Abdomen: Soft, obese, + BS.  Non tender, no guarding, rebound, hernias, masses. Lymphatics: Non tender without lymphadenopathy.  Musculoskeletal: Full ROM except right knee,wearing brace.  Normal gait Skin: Warm, dry without rashes, lesions, ecchymosis.  Neuro: Cranial nerves intact. No cerebellar symptoms.  Psych: Awake and oriented X 3, normal affect, Insight and Judgment appropriate.   Medicare Attestation I have personally reviewed:  The patient's medical and social history Their use of alcohol, tobacco or illicit drugs Their current medications and supplements The patient's functional ability including ADLs,fall risks, home safety risks, cognitive, and hearing and visual impairment Diet and physical activities Evidence for depression or mood disorders  The patient's weight, height, BMI, and visual acuity have been recorded in the chart.  I have made referrals, counseling, and provided education to the patient based on review of the above and I have provided the patient with a written personalized care plan for preventive services.     Garnet Sierras, NP [facility] 06/07/2020  11:45 AM

## 2020-06-07 NOTE — Patient Instructions (Signed)
We are going to start you on Nextletol to help lower your cholesterol.  Take this once a day.  Can take the same time as your zetia.  We will give you samples for this.  Please let us know if you have any adverse reactions.  If all is well we will send in Nexlizet, which is combination tablet with zetia.   [name] , Thank you for taking time to come for your Medicare Wellness Visit. I appreciate your ongoing commitment to your health goals. Please review the following plan we discussed and let me know if I can assist you in the future.   These are the goals we discussed: Goals    . Blood Pressure < 130/80     Check blood pressure daily and keep a log    . HEMOGLOBIN A1C < 7.0     Check fasting glucose daily and keep a log (goal <130)    . LDL CALC < 70    . Weight (lb) < 275 lb (124.7 kg)       This is a list of the screening recommended for you and due dates:  Health Maintenance  Topic Date Due  . COVID-19 Vaccine (1) Never done  . Eye exam for diabetics  08/24/2020  . Complete foot exam   08/28/2020  . Hemoglobin A1C  09/06/2020  . Tetanus Vaccine  02/09/2021  . Colon Cancer Screening  03/10/2022  .  Hepatitis C: One time screening is recommended by Center for Disease Control  (CDC) for  adults born from [birth year] through 1965.   Completed  . HIV Screening  Completed      Aim for 7+ servings of fruits and vegetables daily   65+ fluid ounces of water for healthy kidneys   Limit animal fats in diet for cholesterol and heart health - choose grass fed whenever available   Aim for low stress - take time to unwind and care for your mental health   Aim for 150 min of moderate intensity exercise weekly for heart health, and weights twice weekly for bone health   Aim for 7-9 hours of sleep daily          When it comes to diets, agreement about the perfect plan isn't easy to find, even among the experts. Experts at the [facility] [employer] developed an idea known  as the Healthy Eating Plate. Just imagine a plate divided into logical, healthy portions.   The emphasis is on diet quality:   Load up on vegetables and fruits - one-half of your plate: Aim for color and variety, and remember that white potatoes are high starch and should be consumed in moderation.     Go for whole grains - one-quarter of your plate: Whole wheat, barley, wheat berries, quinoa, oats, brown rice, and foods made with them. If you want pasta, go with whole wheat pasta as serving sized noted on package.   Protein power - one-quarter of your plate: Fish, chicken, beans, and nuts are all healthy, versatile protein sources. Limit red meat.   The diet, however, does go beyond the plate, offering a few other suggestions.   Use healthy plant oils, such as olive, canola, soy, corn, sunflower and peanut. Check the labels, and avoid partially hydrogenated oil, which have unhealthy trans fats.  This will help your cholesterol and heart health.   If you're thirsty, drink water.  Aim for 80 oz of water a day.  Coffee and tea  are ok in moderation, but skip sugary drinks (juices, sodas, sweet tea) and limit milk and dairy products to one or two daily servings.   The type of carbohydrate in the diet is more important than the amount. Some sources of carbohydrates, such as vegetables, fruits, whole grains, and beans--are healthier than others.  If you are tech-savvy, check out https://www.[employer].org/ for tips for replacing current unhealthy food choices, eating healthy on a budget, recipes and more.   Finally, stay active! Walking is an activity, involve family and friends.  It is FREE and maintains and or improves your health.

## 2020-06-08 LAB — HEMOGLOBIN A1C
Hgb A1c MFr Bld: 7.8 % of total Hgb — ABNORMAL HIGH (ref <5.7)
Mean Plasma Glucose: 177 (calc)
eAG (mmol/L): 9.8 (calc)

## 2020-06-08 LAB — COMPLETE METABOLIC PANEL WITH GFR
AG Ratio: 1.3 (calc) (ref 1.0–2.5)
ALT: 10 U/L (ref 9–46)
AST: 20 U/L (ref 10–35)
Albumin: 4.4 g/dL (ref 3.6–5.1)
Alkaline phosphatase (APISO): 67 U/L (ref 35–144)
BUN: 20 mg/dL (ref 7–25)
CO2: 29 mmol/L (ref 20–32)
Calcium: 10.1 mg/dL (ref 8.6–10.3)
Chloride: 102 mmol/L (ref 98–110)
Creat: 1.14 mg/dL (ref 0.70–1.25)
GFR, Est African American: 78 mL/min/{1.73_m2} (ref >=60)
GFR, Est Non African American: 68 mL/min/{1.73_m2} (ref >=60)
Globulin: 3.5 g/dL (calc) (ref 1.9–3.7)
Glucose, Bld: 84 mg/dL (ref 65–99)
Potassium: 4.3 mmol/L (ref 3.5–5.3)
Sodium: 139 mmol/L (ref 135–146)
Total Bilirubin: 0.4 mg/dL (ref 0.2–1.2)
Total Protein: 7.9 g/dL (ref 6.1–8.1)

## 2020-06-08 LAB — CBC WITH DIFFERENTIAL/PLATELET
Absolute Monocytes: 615 cells/uL (ref 200–950)
Basophils Absolute: 52 cells/uL (ref 0–200)
Basophils Relative: 0.9 %
Eosinophils Absolute: 197 cells/uL (ref 15–500)
Eosinophils Relative: 3.4 %
HCT: 42.3 % (ref 38.5–50.0)
Hemoglobin: 13.6 g/dL (ref 13.2–17.1)
Lymphs Abs: 2529 cells/uL (ref 850–3900)
MCH: 26.7 pg — ABNORMAL LOW (ref 27.0–33.0)
MCHC: 32.2 g/dL (ref 32.0–36.0)
MCV: 83.1 fL (ref 80.0–100.0)
MPV: 9.7 fL (ref 7.5–12.5)
Monocytes Relative: 10.6 %
Neutro Abs: 2407 cells/uL (ref 1500–7800)
Neutrophils Relative %: 41.5 %
Platelets: 359 10*3/uL (ref 140–400)
RBC: 5.09 10*6/uL (ref 4.20–5.80)
RDW: 13.3 % (ref 11.0–15.0)
Total Lymphocyte: 43.6 %
WBC: 5.8 10*3/uL (ref 3.8–10.8)

## 2020-06-08 LAB — LIPID PANEL
Cholesterol: 229 mg/dL — ABNORMAL HIGH (ref <200)
HDL: 45 mg/dL (ref >=40)
LDL Cholesterol (Calc): 158 mg/dL (calc) — ABNORMAL HIGH
Non-HDL Cholesterol (Calc): 184 mg/dL (calc) — ABNORMAL HIGH (ref <130)
Total CHOL/HDL Ratio: 5.1 (calc) — ABNORMAL HIGH (ref <5.0)
Triglycerides: 139 mg/dL (ref <150)

## 2020-07-02 ENCOUNTER — Other Ambulatory Visit: Payer: Self-pay | Admitting: Adult Health Nurse Practitioner

## 2020-07-02 DIAGNOSIS — E1169 Type 2 diabetes mellitus with other specified complication: Secondary | ICD-10-CM

## 2020-07-08 MED ORDER — NEXLIZET 180-10 MG PO TABS: 1.0000 | ORAL_TABLET | Freq: Every day | ORAL | 3 refills | Status: DC

## 2020-07-16 ENCOUNTER — Encounter: Payer: Self-pay | Admitting: Adult Health Nurse Practitioner

## 2020-07-16 ENCOUNTER — Other Ambulatory Visit: Payer: Self-pay | Admitting: Adult Health Nurse Practitioner

## 2020-07-16 DIAGNOSIS — E1169 Type 2 diabetes mellitus with other specified complication: Secondary | ICD-10-CM

## 2020-07-16 DIAGNOSIS — M791 Myalgia, unspecified site: Secondary | ICD-10-CM

## 2020-07-16 DIAGNOSIS — E785 Hyperlipidemia, unspecified: Secondary | ICD-10-CM

## 2020-07-16 DIAGNOSIS — T466X5A Adverse effect of antihyperlipidemic and antiarteriosclerotic drugs, initial encounter: Secondary | ICD-10-CM

## 2020-07-16 NOTE — Progress Notes (Unsigned)
Filled out PA for Nextlizet.  Patient has tried Pravachol 02/21/2020 - 01/09/2017, D/C Myalgias Rosuvastatin 12-2016 - 04-2018 D/C Myalgias Simvastatin 04/2018 - 02/2019 D/C Myalgias Pravachol 02/2019, 2-3 days a week D/C Myalgias    Elder Negus, AGNP-C, DNP [facility] 07/16/2020  10:40 AM

## 2020-07-19 ENCOUNTER — Other Ambulatory Visit: Payer: Self-pay | Admitting: Internal Medicine

## 2020-07-19 DIAGNOSIS — N182 Chronic kidney disease, stage 2 (mild): Secondary | ICD-10-CM

## 2020-07-19 DIAGNOSIS — E1122 Type 2 diabetes mellitus with diabetic chronic kidney disease: Secondary | ICD-10-CM

## 2020-08-13 ENCOUNTER — Other Ambulatory Visit: Payer: Self-pay | Admitting: Internal Medicine

## 2020-08-13 DIAGNOSIS — I10 Essential (primary) hypertension: Secondary | ICD-10-CM

## 2020-09-01 ENCOUNTER — Other Ambulatory Visit: Payer: Self-pay | Admitting: Adult Health

## 2020-09-12 NOTE — Progress Notes (Addendum)
Annual  Screening/Preventative Visit  & Comprehensive Evaluation & Examination      This very nice 65 y.o.male presents for a Screening /Preventative Visit & comprehensive evaluation and management of multiple medical co-morbidities.  Patient has been followed for HTN, HLD, T2_NIDDM and Vitamin D Deficiency. Patient has hx/o OSA, but apparently was never able to tolerate the use of the mask.    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 Patient's BP has been controlled at home.  Today's BP is at goal - 120/76. Patient denies any cardiac symptoms as chest pain, palpitations, shortness of breath, dizziness or ankle swelling.       Patient  has hx/o Statin intolerance and his hyperlipidemia is not controlled with diet and poor med compliance. Patient denies myalgias or other medication SE's. Last lipids were not at goal:  Lab Results  Component Value Date   CHOL 229 (H) 06/07/2020   HDL 45 06/07/2020   LDLCALC 158 (H) 06/07/2020   TRIG 139 06/07/2020   CHOLHDL 5.1 (H) 06/07/2020        Patient has hx/o has Morbid Obesity (BMI 42.6+) and T2_NIDDM (2014) w/CKD2  (GFR 77) and patient denies reactive hypoglycemic symptoms, visual blurring, diabetic polys or paresthesias.  Patient admits compulsive over eating & last A1c was not at goal:   Lab Results  Component Value Date   HGBA1C 7.8 (H) 06/07/2020         Finally, patient has history of Vitamin D Deficiency and last vitamin D was at goal:   Lab Results  Component Value Date   VD25OH 86 03/06/2020    Current Outpatient Medications on File Prior to Visit  Medication Sig  . VITA-C  Take daily  . aspirin EC 81 MG tablet Take  daily.  Marland Kitchen NEXLIZET180-10 MG TABS Patient not taking: Reported on 09/13/2020  . VITAMIN D 5000 UNITS  Take 1 tablet  daily.  . colesevelam  625 MG tablet Patient not taking:  Reported on 09/13/2020  . gabapentin  100 MG capsule Take 1 capsule 3 x  /day for   . glimepiride  4 MG tablet TAKE 1 TABLET TWICE DAILY   . hctz 25 MG tablet TAKE 1 TABLET DAILY   . losartan  100 MG tablet TAKE 1 TABLET EVERY DAY  . Magnesium-Zinc  Takes 1200 mg daily.  . metFORMIN-XR 500 MG 24  TAKE 2 TABLETS TWICE DAILY   . Multiple Vitamins-Minerals  Take 1 tablet  daily.  . Omega-3 FISH OIL 500 MG  Take  daily.  . phentermine 37.5 MG tablet Take    1 tablet    every morning      . tadalafil 20 MG tablet Take 1/2 to 1 tablet every 2 to 3 days as needed      Allergies  Allergen Reactions  . Crestor [Rosuvastatin] Cramping        Past Medical History:  Diagnosis Date  . Diabetes mellitus without complication (HCC)   . Hyperlipidemia, mixed 09/29/2012  . Hypertension   . Vitamin D deficiency    Health Maintenance  Topic Date Due  . COVID-19 Vaccine (1) Never done  . OPHTHALMOLOGY EXAM  08/24/2020  . FOOT EXAM  08/28/2020  . HEMOGLOBIN A1C  12/06/2020  . TETANUS/TDAP  02/09/2021  . COLONOSCOPY (Pts 45-63yrs Insurance coverage will need to  be confirmed)  03/10/2022  . Hepatitis C Screening  Completed  . HIV Screening  Completed   Immunization History  Administered Date(s) Administered  . Pneumococcal-Unspecified 02/16/2013  . Tdap 02/10/2011   Last Colon - Patient has hx/o OSA, but apparently never adapted to the use of the mask.   Past Surgical History:  Procedure Laterality Date  . CARPAL TUNNEL RELEASE  2008   bilateral  . CERVICAL DISCECTOMY  2003  . KNEE ARTHROSCOPY  2010   left   Family History  Problem Relation Age of Onset  . Diabetes Father   . Hypertension Father   . Diabetes Sister   . Kidney disease Sister   . Heart disease Mother   . Stroke Mother   . Diabetes Mother   . Colon cancer Neg Hx   . Stomach cancer Neg Hx    Social History   Socioeconomic History  . Marital status: Single  . Number of children: None  Occupational History  .  Not on file  Tobacco Use  . Smoking status: Former Smoker    Quit date: 02/25/1991    Years since quitting: 29.5  . Smokeless tobacco: Never Used  Vaping Use  . Vaping Use: Never used  Substance and Sexual Activity  . Alcohol use: Not Currently  . Drug use: No  . Sexual activity: Not on file    ROS Constitutional: Denies fever, chills, weight loss/gain, headaches, insomnia,  night sweats or change in appetite. Does c/o fatigue. Eyes: Denies redness, blurred vision, diplopia, discharge, itchy or watery eyes.  ENT: Denies discharge, congestion, post nasal drip, epistaxis, sore throat, earache, hearing loss, dental pain, Tinnitus, Vertigo, Sinus pain or snoring.  Cardio: Denies chest pain, palpitations, irregular heartbeat, syncope, dyspnea, diaphoresis, orthopnea, PND, claudication or edema Respiratory: denies cough, dyspnea, DOE, pleurisy, hoarseness, laryngitis or wheezing.  Gastrointestinal: Denies dysphagia, heartburn, reflux, water brash, pain, cramps, nausea, vomiting, bloating, diarrhea, constipation, hematemesis, melena, hematochezia, jaundice or hemorrhoids Genitourinary: Denies dysuria, frequency, discharge, hematuria or flank pain. Has urgency, nocturia x 2-3 & occasional hesitancy. Musculoskeletal: Denies arthralgia, myalgia, stiffness, Jt. Swelling, pain, limp or strain/sprain. Denies Falls. Skin: Denies puritis, rash, hives, warts, acne, eczema or change in skin lesion Neuro: No weakness, tremor, incoordination, spasms, paresthesia or pain Psychiatric: Denies confusion, memory loss or sensory loss. Denies Depression. Endocrine: Denies change in weight, skin, hair change, nocturia, and paresthesia, diabetic polys, visual blurring or hyper / hypo glycemic episodes.  Heme/Lymph: No excessive bleeding, bruising or enlarged lymph nodes.  Physical Exam  BP 120/76   Pulse (!) 106   Temp 97.6 F (36.4 C)   Resp 16   Ht 5' 8.5" (1.74 m)   Wt 284 lb 6.4 oz (129 kg)   SpO2 96%    BMI 42.61 kg/m   General Appearance: Morbidly overweight and in no apparent distress.  Eyes: PERRLA, EOMs, conjunctiva no swelling or erythema, (+) red reflex & fundi not well visualized. Sinuses: No frontal/maxillary tenderness ENT/Mouth: EACs patent / TMs  nl. Nares clear without erythema, swelling, mucoid exudates. Oral hygiene is good. No erythema, swelling, or exudate. Tongue normal, non-obstructing. Tonsils not swollen or erythematous. Hearing normal.  Neck: Supple, thyroid not palpable. No bruits, nodes or JVD. Respiratory: Respiratory effort normal.  BS equal and clear bilateral without rales, rhonci, wheezing or stridor. Cardio: Heart sounds are normal with regular rate and rhythm and no murmurs, rubs or gallops. Peripheral pulses are normal and equal bilaterally without edema. No aortic or femoral bruits.  Chest: symmetric with normal excursions and percussion.  Abdomen: Soft, obese with Nl bowel sounds. Nontender, no guarding, rebound, hernias, masses, or organomegaly.  Lymphatics: Non tender without lymphadenopathy.  Musculoskeletal: Full ROM all peripheral extremities, joint stability, 5/5 strength, and normal gait. Skin: Warm and dry without rashes, lesions, cyanosis, clubbing or  ecchymosis.  Neuro: Cranial nerves intact, reflexes equal bilaterally. Normal muscle tone, no cerebellar symptoms. Sensation intact to touch, vibratory and Monofilament to the toes bilaterally. Pysch: Alert and oriented x 3 with normal affect, limited  insight and judgment seems poor.   Assessment and Plan  1. Annual Preventative/Screening Exam    2. Essential hypertension  - EKG 12-Lead - [place], RETROPERITNL ABD,  LTD - Urinalysis, Routine w reflex microscopic - Microalbumin / creatinine urine ratio - CBC with Differential/Platelet - COMPLETE METABOLIC PANEL WITH GFR - Magnesium  3. Hyperlipidemia associated with type 2 diabetes mellitus (HCC)  - EKG 12-Lead - [place], RETROPERITNL ABD,  LTD -  Lipid panel  4. Type 2 diabetes mellitus with stage 2 chronic kidney  disease, without long-term current use of insulin (HCC)  - EKG 12-Lead - [place], RETROPERITNL ABD,  LTD - Urinalysis, Routine w reflex microscopic - Microalbumin / creatinine urine ratio - PTH, intact and calcium  5. Vitamin D deficiency  - VITAMIN D 25 Hydroxy   6. Morbid obesity with BMI of 40.0-44.9, adult (HCC)  - TSH  7. BPH with obstruction/lower urinary tract symptoms  - PSA  8. Screening for colorectal cancer  - POC Hemoccult Bld/Stl  - Cologuard - CBC with Differential/Platelet  9. Peripheral sensory neuropathy due to T2_NIDDM  - HM DIABETES FOOT EXAM - LOW EXTREMITY NEUR EXAM DOCUM  10. Screening for ischemic heart disease  - EKG 12-Lead  11. FHx: heart disease  - EKG 12-Lead - [place], RETROPERITNL ABD,  LTD  12. Smoker  - EKG 12-Lead - [place], RETROPERITNL ABD,  LTD  13. Screening for AAA (aortic abdominal aneurysm)  - [place], RETROPERITNL ABD,  LTD  14. Medication management  - Urinalysis, Routine w reflex microscopic - Microalbumin / creatinine urine ratio - CBC with Differential/Platelet - COMPLETE METABOLIC PANEL WITH GFR - Magnesium - Testosterone - Lipid panel - TSH - Hemoglobin A1c - VITAMIN D 25 Hydroxyl  15. Prostate cancer screening  - PSA         Patient was counseled in prudent diet, weight control to achieve/maintain BMI less than 25, BP monitoring, regular exercise and medications as discussed.  Discussed med effects and SE's. Routine screening labs and tests as requested with regular follow-up as recommended. Over 40 minutes of exam, counseling, chart review and high complex critical decision making was performed   Marinus Maw, MD

## 2020-09-13 ENCOUNTER — Ambulatory Visit (INDEPENDENT_AMBULATORY_CARE_PROVIDER_SITE_OTHER): Payer: Medicare HMO | Admitting: Internal Medicine

## 2020-09-13 ENCOUNTER — Other Ambulatory Visit: Payer: Self-pay

## 2020-09-13 VITALS — BP 120/76 | HR 106 | Temp 97.6°F | Resp 16 | Ht 68.5 in | Wt 284.4 lb

## 2020-09-13 DIAGNOSIS — E559 Vitamin D deficiency, unspecified: Secondary | ICD-10-CM

## 2020-09-13 DIAGNOSIS — N401 Enlarged prostate with lower urinary tract symptoms: Secondary | ICD-10-CM | POA: Diagnosis not present

## 2020-09-13 DIAGNOSIS — Z1211 Encounter for screening for malignant neoplasm of colon: Secondary | ICD-10-CM

## 2020-09-13 DIAGNOSIS — I10 Essential (primary) hypertension: Secondary | ICD-10-CM

## 2020-09-13 DIAGNOSIS — E1169 Type 2 diabetes mellitus with other specified complication: Secondary | ICD-10-CM | POA: Diagnosis not present

## 2020-09-13 DIAGNOSIS — Z8249 Family history of ischemic heart disease and other diseases of the circulatory system: Secondary | ICD-10-CM

## 2020-09-13 DIAGNOSIS — Z6841 Body Mass Index (BMI) 40.0 and over, adult: Secondary | ICD-10-CM

## 2020-09-13 DIAGNOSIS — E1142 Type 2 diabetes mellitus with diabetic polyneuropathy: Secondary | ICD-10-CM

## 2020-09-13 DIAGNOSIS — E785 Hyperlipidemia, unspecified: Secondary | ICD-10-CM

## 2020-09-13 DIAGNOSIS — E1122 Type 2 diabetes mellitus with diabetic chronic kidney disease: Secondary | ICD-10-CM | POA: Diagnosis not present

## 2020-09-13 DIAGNOSIS — Z79899 Other long term (current) drug therapy: Secondary | ICD-10-CM | POA: Diagnosis not present

## 2020-09-13 DIAGNOSIS — F17200 Nicotine dependence, unspecified, uncomplicated: Secondary | ICD-10-CM

## 2020-09-13 DIAGNOSIS — Z0000 Encounter for general adult medical examination without abnormal findings: Secondary | ICD-10-CM

## 2020-09-13 DIAGNOSIS — Z0001 Encounter for general adult medical examination with abnormal findings: Secondary | ICD-10-CM

## 2020-09-13 DIAGNOSIS — Z125 Encounter for screening for malignant neoplasm of prostate: Secondary | ICD-10-CM | POA: Diagnosis not present

## 2020-09-13 DIAGNOSIS — N138 Other obstructive and reflux uropathy: Secondary | ICD-10-CM

## 2020-09-13 DIAGNOSIS — N182 Chronic kidney disease, stage 2 (mild): Secondary | ICD-10-CM

## 2020-09-13 DIAGNOSIS — Z136 Encounter for screening for cardiovascular disorders: Secondary | ICD-10-CM

## 2020-09-13 NOTE — Patient Instructions (Signed)

## 2020-09-14 LAB — COMPLETE METABOLIC PANEL WITH GFR
AG Ratio: 1.2 (calc) (ref 1.0–2.5)
ALT: 11 U/L (ref 9–46)
AST: 16 U/L (ref 10–35)
Albumin: 4.4 g/dL (ref 3.6–5.1)
Alkaline phosphatase (APISO): 85 U/L (ref 35–144)
BUN/Creatinine Ratio: 23 (calc) — ABNORMAL HIGH (ref 6–22)
BUN: 37 mg/dL — ABNORMAL HIGH (ref 7–25)
CO2: 26 mmol/L (ref 20–32)
Calcium: 10.3 mg/dL (ref 8.6–10.3)
Chloride: 94 mmol/L — ABNORMAL LOW (ref 98–110)
Creat: 1.58 mg/dL — ABNORMAL HIGH (ref 0.70–1.25)
GFR, Est African American: 53 mL/min/{1.73_m2} — ABNORMAL LOW (ref >=60)
GFR, Est Non African American: 46 mL/min/{1.73_m2} — ABNORMAL LOW (ref >=60)
Globulin: 3.8 g/dL (calc) — ABNORMAL HIGH (ref 1.9–3.7)
Glucose, Bld: 616 mg/dL (ref 65–99)
Potassium: 4.9 mmol/L (ref 3.5–5.3)
Sodium: 134 mmol/L — ABNORMAL LOW (ref 135–146)
Total Bilirubin: 0.6 mg/dL (ref 0.2–1.2)
Total Protein: 8.2 g/dL — ABNORMAL HIGH (ref 6.1–8.1)

## 2020-09-14 LAB — CBC WITH DIFFERENTIAL/PLATELET
Absolute Monocytes: 538 cells/uL (ref 200–950)
Basophils Absolute: 59 cells/uL (ref 0–200)
Basophils Relative: 1.4 %
Eosinophils Absolute: 139 cells/uL (ref 15–500)
Eosinophils Relative: 3.3 %
HCT: 47.5 % (ref 38.5–50.0)
Hemoglobin: 14.9 g/dL (ref 13.2–17.1)
Lymphs Abs: 1449 cells/uL (ref 850–3900)
MCH: 26.6 pg — ABNORMAL LOW (ref 27.0–33.0)
MCHC: 31.4 g/dL — ABNORMAL LOW (ref 32.0–36.0)
MCV: 84.7 fL (ref 80.0–100.0)
MPV: 11.1 fL (ref 7.5–12.5)
Monocytes Relative: 12.8 %
Neutro Abs: 2016 cells/uL (ref 1500–7800)
Neutrophils Relative %: 48 %
Platelets: 290 10*3/uL (ref 140–400)
RBC: 5.61 10*6/uL (ref 4.20–5.80)
RDW: 12.5 % (ref 11.0–15.0)
Total Lymphocyte: 34.5 %
WBC: 4.2 10*3/uL (ref 3.8–10.8)

## 2020-09-14 LAB — PTH, INTACT AND CALCIUM
Calcium: 10.3 mg/dL (ref 8.6–10.3)
PTH: 10 pg/mL — ABNORMAL LOW (ref 14–64)

## 2020-09-14 LAB — PSA: PSA: 0.64 ng/mL (ref <=4.0)

## 2020-09-14 LAB — URINALYSIS, ROUTINE W REFLEX MICROSCOPIC
Bilirubin Urine: NEGATIVE
Hgb urine dipstick: NEGATIVE
Leukocytes,Ua: NEGATIVE
Nitrite: NEGATIVE
Protein, ur: NEGATIVE
Specific Gravity, Urine: 1.034 (ref 1.001–1.03)
pH: <=5 (ref 5.0–8.0)

## 2020-09-14 LAB — HEMOGLOBIN A1C
Hgb A1c MFr Bld: 13.7 % of total Hgb — ABNORMAL HIGH (ref <5.7)
Mean Plasma Glucose: 346 mg/dL
eAG (mmol/L): 19.2 mmol/L

## 2020-09-14 LAB — LIPID PANEL
Cholesterol: 195 mg/dL (ref <200)
HDL: 37 mg/dL — ABNORMAL LOW (ref >=40)
Non-HDL Cholesterol (Calc): 158 mg/dL (calc) — ABNORMAL HIGH (ref <130)
Total CHOL/HDL Ratio: 5.3 (calc) — ABNORMAL HIGH (ref <5.0)
Triglycerides: 857 mg/dL — ABNORMAL HIGH (ref <150)

## 2020-09-14 LAB — MAGNESIUM: Magnesium: 2.3 mg/dL (ref 1.5–2.5)

## 2020-09-14 LAB — VITAMIN D 25 HYDROXY (VIT D DEFICIENCY, FRACTURES): Vit D, 25-Hydroxy: 62 ng/mL (ref 30–100)

## 2020-09-14 LAB — TESTOSTERONE: Testosterone: 67 ng/dL — ABNORMAL LOW (ref 250–827)

## 2020-09-14 LAB — TSH: TSH: 1.94 mIU/L (ref 0.40–4.50)

## 2020-09-14 LAB — MICROALBUMIN / CREATININE URINE RATIO
Creatinine, Urine: 36 mg/dL (ref 20–320)
Microalb Creat Ratio: 56 mcg/mg creat — ABNORMAL HIGH (ref <30)
Microalb, Ur: 2 mg/dL

## 2020-09-15 ENCOUNTER — Encounter: Payer: Self-pay | Admitting: Internal Medicine

## 2020-09-21 ENCOUNTER — Other Ambulatory Visit: Payer: Self-pay | Admitting: Internal Medicine

## 2020-09-21 DIAGNOSIS — N182 Chronic kidney disease, stage 2 (mild): Secondary | ICD-10-CM

## 2020-09-21 DIAGNOSIS — E1122 Type 2 diabetes mellitus with diabetic chronic kidney disease: Secondary | ICD-10-CM

## 2020-09-25 ENCOUNTER — Other Ambulatory Visit: Payer: Self-pay | Admitting: Internal Medicine

## 2020-09-25 DIAGNOSIS — E1122 Type 2 diabetes mellitus with diabetic chronic kidney disease: Secondary | ICD-10-CM

## 2020-09-25 DIAGNOSIS — N182 Chronic kidney disease, stage 2 (mild): Secondary | ICD-10-CM

## 2020-09-25 DIAGNOSIS — Z1212 Encounter for screening for malignant neoplasm of rectum: Secondary | ICD-10-CM | POA: Diagnosis not present

## 2020-09-25 DIAGNOSIS — Z1211 Encounter for screening for malignant neoplasm of colon: Secondary | ICD-10-CM | POA: Diagnosis not present

## 2020-09-25 MED ORDER — GLIMEPIRIDE 4 MG PO TABS: ORAL_TABLET | ORAL | 0 refills | Status: DC

## 2020-09-26 ENCOUNTER — Ambulatory Visit (INDEPENDENT_AMBULATORY_CARE_PROVIDER_SITE_OTHER): Payer: Medicare HMO | Admitting: Adult Health

## 2020-09-26 ENCOUNTER — Other Ambulatory Visit: Payer: Self-pay

## 2020-09-26 ENCOUNTER — Encounter: Payer: Self-pay | Admitting: Adult Health

## 2020-09-26 VITALS — BP 122/78 | HR 100 | Temp 96.1°F | Wt 290.0 lb

## 2020-09-26 DIAGNOSIS — E1165 Type 2 diabetes mellitus with hyperglycemia: Secondary | ICD-10-CM

## 2020-09-26 DIAGNOSIS — N289 Disorder of kidney and ureter, unspecified: Secondary | ICD-10-CM | POA: Diagnosis not present

## 2020-09-26 DIAGNOSIS — E1122 Type 2 diabetes mellitus with diabetic chronic kidney disease: Secondary | ICD-10-CM

## 2020-09-26 DIAGNOSIS — N182 Chronic kidney disease, stage 2 (mild): Secondary | ICD-10-CM | POA: Diagnosis not present

## 2020-09-26 NOTE — Progress Notes (Signed)
Assessment and Plan:  [name] [name] was seen today for follow-up.  Diagnoses and all orders for this visit:  Poorly controlled diabetes mellitus ([name] [name]) Excellent progress with lifestyle change and glipizide/metformin Continue monitoring fasting until <130, then reduce glipizide from 3 tabs to 2 Continue to monitor; fasting goal range 100-130 If any low, contact office; hypoglycemia management reviewed and information provided Start adding exercise, 30 min daily after a meal  CKD stage 2 due to type 2 diabetes mellitus (HCC) Deterioration in renal function Increase fluids, avoid NSAIDS, monitor sugars, will monitor -     BASIC METABOLIC PANEL WITH GFR  Further disposition pending results of labs. Discussed med's effects and SE's.   Over 15 minutes of exam, counseling, chart review, and critical decision making was performed.   Future Appointments  Date Time Provider [place]  12/17/2020 10:30 AM Liane Comber, NP [facility] None  03/21/2021 10:30 AM Unk Pinto, MD [facility] None  06/10/2021 10:30 AM Garnet Sierras, NP [facility] None  09/24/2021 11:00 AM Unk Pinto, MD [facility] None    ------------------------------------------------------------------------------------------------------------------   HPI 65 y.o.male with morbid obeisty, poorly controlled T2DM with CKD II presents for follow up on severe hyperglycemia and deterioration in renal function.   He admits at last visit was "binging" - eating lots of sweets and pie. Had A1C 13.7, and glucose of 616. Reports has since stopped all sweets. Doing more fresh fruit.   His blood pressure has been controlled at home, today their BP is BP: 122/78 He denies chest pain, shortness of breath, dizziness.   He has been working on diet and exercise for T2DM with CKD, and denies hypoglycemia , increased appetite, nausea, paresthesia of the feet, polydipsia, polyuria and visual disturbances. He is metformin 2000 mg Dr.  Melford Aase increase glimepiride from 2 tabs/day to 3 tabs temporarily  Fasting over the last week has improved from 430 down to 209 this AM -  Last A1C in the office was:  Lab Results  Component Value Date   HGBA1C 13.7 (H) 09/13/2020   BMI is Body mass index is 43.45 kg/m. he is working on diet, admits exercise currently limited by weather, does report active in the yard. Does have access to the [facility] Wt Readings from Last 3 Encounters:  09/26/20 290 lb (131.5 kg)  09/13/20 284 lb 6.4 oz (129 kg)  06/07/20 (!) 307 lb (139.3 kg)   He has been pushing water intake, estimates 6+ bottles daily  Lab Results  Component Value Date   GFRNONAA 46 (L) 09/13/2020   GFRNONAA 68 06/07/2020   GFRNONAA 68 03/06/2020   Lab Results  Component Value Date   CREATININE 1.58 (H) 09/13/2020   CREATININE 1.14 06/07/2020   CREATININE 1.13 03/06/2020     Past Medical History:  Diagnosis Date  . Diabetes mellitus without complication ([place])   . Hyperlipidemia, mixed 09/29/2012  . Hypertension   . Vitamin D deficiency      Allergies  Allergen Reactions  . Crestor [Rosuvastatin Calcium] Other (See Comments)    Cramping    Current Outpatient Medications on File Prior to Visit  Medication Sig  . Alcohol Swabs PADS Use to check blood sugar 1 time daily-E11.2  . Ascorbic Acid (VITA-C PO) Take by mouth.  Marland Kitchen aspirin EC 81 MG tablet Take 81 mg by mouth daily.  . Blood Glucose Calibration (TRUE METRIX LEVEL 1) Low SOLN Use with glucometer. Check blood sugar 1 time daily-E11.2.  . Blood Glucose Monitoring Suppl (TRUE METRIX METER) w/Device KIT  Check blood sugar 1 time daily- E11.2  . Cholecalciferol (VITAMIN D-3) 5000 UNITS TABS Take 1 tablet by mouth daily.  Marland Kitchen gabapentin (NEURONTIN) 100 MG capsule Take 1 capsule 3 x  /day for Neuropathy Pains in Hands  . glimepiride (AMARYL) 4 MG tablet Take  1 tablet  2 x /day  with Meals  for Diabetes  . glucose blood (TRUE METRIX BLOOD GLUCOSE TEST) test strip Check  blood sugar 1 time daily-E11.2  . hydrochlorothiazide (HYDRODIURIL) 25 MG tablet TAKE 1 TABLET DAILY FOR BLOOD PRESSURE AND FLUID  . losartan (COZAAR) 100 MG tablet TAKE 1 TABLET EVERY DAY FOR BLOOD PRESSURE AND DIABETIC KIDNEY PROTECTION  . Magnesium-Zinc (MAGNESIUM-CHELATED ZINC PO) Take 400 mg by mouth daily. Takes 1200 mg daily.  . metFORMIN (GLUCOPHAGE-XR) 500 MG 24 hr tablet TAKE 2 TABLETS TWICE DAILY WITH MEALS FOR DIABETES  . Multiple Vitamins-Minerals (THERAGRAN-M PREMIER 50 PLUS PO) Take 1 tablet by mouth daily.  . Omega-3 Fatty Acids (FISH OIL) 500 MG CAPS Take by mouth daily.  . phentermine (ADIPEX-P) 37.5 MG tablet Take    1 tablet    every morning    for Dieting & Weight Loss  . tadalafil (CIALIS) 20 MG tablet Take 1/2 to 1 tablet every 2 to 3 days as needed for XXXX  . TRUEplus Lancets 30G MISC Check blood sugar 1 time daily-E11.2   No current facility-administered medications on file prior to visit.    ROS: all negative except above.   Physical Exam:  BP 122/78   Pulse 100   Temp (!) 96.1 F (35.6 C)   Wt 290 lb (131.5 kg)   SpO2 94%   BMI 43.45 kg/m   General Appearance: Well nourished, in no apparent distress. Eyes: PERRLA, EOMs, conjunctiva no swelling or erythema ENT/Mouth: Ext aud canals clear, TMs without erythema, bulging. No erythema, swelling, or exudate on post pharynx.  Tonsils not swollen or erythematous. Hearing normal.  Neck: Supple, thyroid normal.  Respiratory: Respiratory effort normal, BS equal bilaterally without rales, rhonchi, wheezing or stridor.  Cardio: RRR with no MRGs. Brisk peripheral pulses without edema.  Abdomen: Soft, obese abdomen + BS.  Non tender, no guarding, rebound, hernias, masses. Lymphatics: Non tender without lymphadenopathy.  Musculoskeletal: Full ROM, 5/5 strength, normal gait.  Skin: Warm, dry without rashes, lesions, ecchymosis.  Neuro: Cranial nerves intact. Normal muscle tone Psych: Awake and oriented X 3, normal  affect, Insight and Judgment appropriate.     [name], NP 2:45 PM [facility]

## 2020-09-26 NOTE — Patient Instructions (Addendum)
Start exercising - 30 min after a meal will help bring down sugars  Fasting glucose goal 100-130, once <130, reduce glimepiride from 3 tabs to 2.5 then 2 tabs (breakfast and dinner)   If having any low sugars with 2 tabs/day, reduce to 1/2 tab (reduce night time dose if night time low sugars, reduce morning dose if daytime low sugars)       Bad carbs also include fruit juice, alcohol, and sweet tea. These are empty calories that do not signal to your brain that you are full.   Please remember the good carbs are still carbs which convert into sugar. So please measure them out no more than 1/2-1 cup of rice, oatmeal, pasta, and beans  Veggies are however free foods! Pile them on.   Not all fruit is created equal. Please see the list below, the fruit at the bottom is higher in sugars than the fruit at the top. Please avoid all dried fruits.       Hypoglycemia Hypoglycemia is when the sugar (glucose) level in your blood is too low. Low blood sugar can happen to people who have diabetes and people who do not have diabetes. Low blood sugar can happen quickly, and it can be an emergency. What are the causes? This condition happens most often in people who have diabetes and may be caused by:  Diabetes medicine.  Not eating enough, or not eating often enough.  Doing more physical activity.  Drinking alcohol on an empty stomach. If you do not have diabetes, hypoglycemia may be caused by:  A tumor in the pancreas.  Not eating enough, or not eating for long periods at a time (fasting).  A very bad infection or illness.  Problems after having weight loss (bariatric) surgery.  Kidney failure or liver failure.  Certain medicines. What increases the risk? This condition is more likely to develop in people who:  Have diabetes and take medicines to lower their blood sugar.  Abuse alcohol.  Have a very bad illness. What are the signs or symptoms? Symptoms depend on whether  your low blood sugar is mild, moderate, or very low. Mild  Hunger.  Feeling worried or nervous (anxious).  Sweating and feeling clammy.  Feeling dizzy or light-headed.  Being sleepy or having trouble sleeping.  Feeling like you may vomit (nauseous).  A fast heartbeat.  A headache.  Blurry vision.  Being irritable or grouchy.  Tingling or loss of feeling (numbness) around your mouth, lips, or tongue.  Trouble with moving (coordination). Moderate  Confusion and poor judgment.  Behavior changes.  Weakness.  Uneven heartbeats. Very low Very low blood sugar (severe hypoglycemia) is a medical emergency. It can cause:  Fainting.  Jerky movements that you cannot control (seizure).  Loss of consciousness (coma).  Death. How is this treated? Treating low blood sugar Low blood sugar is often treated by eating or drinking something sugary right away. The snack should contain 15 grams of a fast-acting carb (carbohydrate). Options include:  4 oz (120 mL) of fruit juice.  4-6 oz (120-150 mL) of regular soda (not diet soda).  8 oz (240 mL) of low-fat milk.  Several pieces of hard candy. Check food labels to find out how many to eat for 15 grams.  1 Tbsp (15 mL) of sugar or honey. Treating low blood sugar if you have diabetes If you can think clearly and swallow safely, follow the 15:15 rule:  Take 15 grams of a fast-acting carb. Talk  with your doctor about how much you should take.  Always keep a source of fast-acting carb with you, such as: ? Sugar tablets (glucose pills). Take 4 pills. ? Several pieces of hard candy. Check food labels to see how many pieces to eat for 15 grams. ? 4 oz (120 mL) of fruit juice. ? 4-6 oz (120-150 mL) of regular (not diet) soda. ? 1 Tbsp (15 mL) of honey or sugar.  Check your blood sugar 15 minutes after you take the carb.  If your blood sugar is still at or below 70 mg/dL (3.9 mmol/L), take 15 grams of a carb again.  If your  blood sugar does not go above 70 mg/dL (3.9 mmol/L) after 3 tries, get help right away.  After your blood sugar goes back to normal, eat a meal or a snack within 1 hour.   Treating very low blood sugar If your blood sugar is at or below 54 mg/dL (3 mmol/L), you have very low blood sugar, or severe hypoglycemia. This is an emergency. Get medical help right away. If you have very low blood sugar and you cannot eat or drink, you will need to be given a hormone called glucagon. A family member or friend should learn how to check your blood sugar and how to give you glucagon. Ask your doctor if you need to have an emergency glucagon kit at home. Very low blood sugar may also need to be treated in a hospital. Follow these instructions at home: General instructions  Take over-the-counter and prescription medicines only as told by your doctor.  Stay aware of your blood sugar as told by your doctor.  If you drink alcohol: ? Limit how much you use to:  0-1 drink a day for nonpregnant women.  0-2 drinks a day for men. ? Be aware of how much alcohol is in your drink. In the [place] one drink equals one 12 oz bottle of beer (355 mL), one 5 oz glass of wine (148 mL), or one 1 oz glass of hard liquor (44 mL).  Keep all follow-up visits as told by your doctor. This is important. If you have diabetes:  Always have a rapid-acting carb (15 grams) option with you to treat low blood sugar.  Follow your diabetes care plan as told by your doctor. Make sure you: ? Know the symptoms of low blood sugar. ? Check your blood sugar as often as told by your doctor. Always check it before and after exercise. ? Always check your blood sugar before you drive. ? Take your medicines as told. ? Follow your meal plan. ? Eat on time. Do not skip meals.  Share your diabetes care plan with: ? Your work or school. ? People you live with.  Carry a card or wear jewelry that says you have diabetes.   Contact a doctor  if:  You have trouble keeping your blood sugar in your target range.  You have low blood sugar often. Get help right away if:  You still have symptoms after you eat or drink something that contains 15 grams of fast-acting carb and you cannot get your blood sugar above 70 mg/dL by following the 15:15 rule.  Your blood sugar is at or below 54 mg/dL (3 mmol/L).  You have a seizure.  You faint. These symptoms may be an emergency. Do not wait to see if the symptoms will go away. Get medical help right away. Call your local emergency services (911 in the [place]  Do not drive yourself to the hospital. Summary  Hypoglycemia happens when the level of sugar (glucose) in your blood is too low.  Low blood sugar can happen to people who have diabetes and people who do not have diabetes. Low blood sugar can happen quickly, and it can be an emergency.  Make sure you know the symptoms of low blood sugar and know how to treat it.  Always keep a source of sugar (fast-acting carb) with you to treat low blood sugar. This information is not intended to replace advice given to you by your health care provider. Make sure you discuss any questions you have with your health care provider. Document Revised: 06/29/2019 Document Reviewed: 06/29/2019 Elsevier Patient Education  2021 [employer].

## 2020-09-27 LAB — BASIC METABOLIC PANEL WITH GFR
BUN: 21 mg/dL (ref 7–25)
CO2: 27 mmol/L (ref 20–32)
Calcium: 10 mg/dL (ref 8.6–10.3)
Chloride: 103 mmol/L (ref 98–110)
Creat: 1.07 mg/dL (ref 0.70–1.25)
GFR, Est African American: 85 mL/min/{1.73_m2} (ref >=60)
GFR, Est Non African American: 73 mL/min/{1.73_m2} (ref >=60)
Glucose, Bld: 163 mg/dL — ABNORMAL HIGH (ref 65–99)
Potassium: 4.4 mmol/L (ref 3.5–5.3)
Sodium: 138 mmol/L (ref 135–146)

## 2020-09-28 ENCOUNTER — Other Ambulatory Visit: Payer: Self-pay

## 2020-09-28 DIAGNOSIS — N182 Chronic kidney disease, stage 2 (mild): Secondary | ICD-10-CM

## 2020-09-28 DIAGNOSIS — E1122 Type 2 diabetes mellitus with diabetic chronic kidney disease: Secondary | ICD-10-CM

## 2020-09-28 MED ORDER — GLIMEPIRIDE 4 MG PO TABS: ORAL_TABLET | ORAL | 0 refills | Status: DC

## 2020-10-01 LAB — COLOGUARD
COLOGUARD: NEGATIVE
Cologuard: NEGATIVE

## 2020-10-02 ENCOUNTER — Encounter: Payer: Self-pay | Admitting: *Deleted

## 2020-10-02 ENCOUNTER — Telehealth: Payer: Self-pay | Admitting: *Deleted

## 2020-10-02 NOTE — Telephone Encounter (Signed)
Patient aware of negative cologuard result.

## 2020-10-27 ENCOUNTER — Other Ambulatory Visit: Payer: Self-pay | Admitting: Internal Medicine

## 2020-11-10 ENCOUNTER — Other Ambulatory Visit: Payer: Self-pay | Admitting: Internal Medicine

## 2020-12-05 DIAGNOSIS — H2513 Age-related nuclear cataract, bilateral: Secondary | ICD-10-CM | POA: Diagnosis not present

## 2020-12-05 DIAGNOSIS — H25013 Cortical age-related cataract, bilateral: Secondary | ICD-10-CM | POA: Diagnosis not present

## 2020-12-05 DIAGNOSIS — H52209 Unspecified astigmatism, unspecified eye: Secondary | ICD-10-CM | POA: Diagnosis not present

## 2020-12-05 DIAGNOSIS — E119 Type 2 diabetes mellitus without complications: Secondary | ICD-10-CM | POA: Diagnosis not present

## 2020-12-05 DIAGNOSIS — H5203 Hypermetropia, bilateral: Secondary | ICD-10-CM | POA: Diagnosis not present

## 2020-12-05 DIAGNOSIS — H35373 Puckering of macula, bilateral: Secondary | ICD-10-CM | POA: Diagnosis not present

## 2020-12-05 DIAGNOSIS — H524 Presbyopia: Secondary | ICD-10-CM | POA: Diagnosis not present

## 2020-12-14 NOTE — Progress Notes (Signed)
FOLLOW UP  Assessment and Plan:   Hypertension At goal; continue medications Monitor blood pressure at home; patient to call if consistently greater than 130/80 Continue DASH diet.   Reminder to go to the ER if any CP, SOB, nausea, dizziness, severe HA, changes vision/speech, left arm numbness and tingling and jaw pain.  Hyperlipidemia associated with T2DM ([place]) Currently above goal; reports SE with 3 statins, zetia, welchol, nexletol not covered.  Discussed fenofibrate if trigs remain severely elevated; likely r/t last severe hyperglycemia LDL goal <70 Continue low cholesterol diet and exercise.  Check lipid panel.   Diabetes with diabetic chronic kidney disease and neuropathy and circulatory complications Continue medication: metformin 2000 mg daily, glimepiride 4 mg BID - atypical elevations last check, poor diet r/t father passing away, much improved but preventing weight loss Discussed ozempic; he is interested in switching; starting dose with taper instructions sent in - if insurance covers and starts, recommended 1/2 dose of glimepiride with close monitoring, stop if any low glucose Home glucose logs demonstrate mostly <130 Continue diet and exercise.  Perform daily foot/skin check, notify office of any concerning changes.  Check A1C  CKD II associated with T2DM ([facility]) Increase fluids, avoid NSAIDS, monitor sugars, will monitor  Morbid obesity with co morbidities Long discussion about weight loss, diet, and exercise  Recommended diet heavy in fruits and veggies and low in animal meats, cheeses, and dairy products, appropriate calorie intake Discussed ideal weight for height  Patient has been on phentermine with perceived benefit for daytime appetite suppression; recommended med holiday Patient will work on cutting out processed foods -  Focus on whole foods;  Will attempt to order ozempic-  Weight once a week, goal 0.5-2 lb/week, keep log, initial weight goal <275  lb Will follow up in 3 months  Vitamin D Def At goal at last visit; continue supplementation to maintain goal of 70-100 Defer Vit D level  Continue diet and meds as discussed. Further disposition pending results of labs. Discussed med's effects and SE's.   Over 30 minutes of exam, counseling, chart review, and critical decision making was performed.   Future Appointments  Date Time Provider [place]  03/21/2021 10:30 AM Unk Pinto, MD [facility] None  06/10/2021 10:30 AM Garnet Sierras, NP [facility] None  09/24/2021 11:00 AM Unk Pinto, MD [facility] None    ----------------------------------------------------------------------------------------------------------------------  HPI 65 y.o. male  presents for 3 month follow up on hypertension, cholesterol, diabetes, morbid obesity and vitamin D deficiency.   He is followed by Dr. [name] for Bil knee pain, worse L, s/p scope several years ago. Has been advised need for replacement but needs to lose weight. He uses brace and gabapentin, tylenol PRN pain, recently improved.   he is prescribed phentermine for weight loss, takes 1/2 tab daily only with perceived benefit during the day, but does admit to eating more in the evening. While on the medication they have lost 0 lbs since last visit. They deny palpitations, anxiety, trouble sleeping, elevated BP.   BMI is Body mass index is 43.9 kg/m., he is working on diet, exercise limited due to knee pain. [facility], considering [facility] aerobics.  End goal <230 lb.  Wt Readings from Last 3 Encounters:  12/17/20 293 lb (132.9 kg)  09/26/20 290 lb (131.5 kg)  09/13/20 284 lb 6.4 oz (129 kg)   Vascular US in 2015 showed mild aneurysmal dilation of bil iliac arteries, hasn't had follow up, agreeable to  this today.   His does check BP at home, today their BP is BP: 120/78  He does not workout. He denies chest pain, shortness of breath, dizziness.   He  is not on cholesterol medication (SE with rosuvastatin, simvastatin, pravastatin, zetia, ? welchol, nexlitol was not covered by insurance) and denies myalgias. His cholesterol is not at goal. The cholesterol last visit was:   Lab Results  Component Value Date   CHOL 195 09/13/2020   HDL 37 (L) 09/13/2020   [place]  09/13/2020     Comment:     . LDL cholesterol not calculated. Triglyceride levels greater than 400 mg/dL invalidate calculated LDL results. . Reference range: <100 . Desirable range <100 mg/dL for primary prevention;   <70 mg/dL for patients with CHD or diabetic patients  with > or = 2 CHD risk factors. Marland Kitchen LDL-C is now calculated using the Martin-Hopkins  calculation, which is a validated novel method providing  better accuracy than the Friedewald equation in the  estimation of LDL-C.  Cresenciano Genre et al. Annamaria Helling. 6144;315(40): 2061-2068  (http://education.[employer].com/faq/FAQ164)    TRIG 857 (H) 09/13/2020   CHOLHDL 5.3 (H) 09/13/2020    He has been working on diet and exercise for T2 diabetes (treated by metformin 2000 mg daily, glimepiride 4 mg BID, last visit with severe A1C elevated to 13.7 - he has fairly labile A1Cs depending on lifestyle adherence - typically in 6-7 range,  was recommended initiation of 70/30 novolin after but declined due to dealing with family death at the time, admits was eating ice cream), and denies foot ulcerations, hyperglycemia, hypoglycemia , increased appetite, nausea, polydipsia, polyuria, visual disturbances, vomiting and weight loss.   Does check fasting sugar - running 100-138. Down from 32+ in Jan.   He does have peripheral sensory neuropathy, prescribed gabapentin 100 mg, typically takes BID  Last A1C in the office was:  Lab Results  Component Value Date   HGBA1C 13.7 (H) 09/13/2020   He has CKD II associated with T2DM monitored at this office:  Lab Results  Component Value Date   GFRAA 85 09/26/2020   Patient is on Vitamin  D supplement and at goal at last check:    Lab Results  Component Value Date   VD25OH 62 09/13/2020       Current Medications:  Current Outpatient Medications on File Prior to Visit  Medication Sig  . Alcohol Swabs PADS Use to check blood sugar 1 time daily-E11.2  . Ascorbic Acid (VITA-C PO) Take by mouth.  Marland Kitchen aspirin EC 81 MG tablet Take 81 mg by mouth daily.  . Blood Glucose Calibration (TRUE METRIX LEVEL 1) Low SOLN Use with glucometer. Check blood sugar 1 time daily-E11.2.  . Blood Glucose Monitoring Suppl (TRUE METRIX METER) w/Device KIT Check blood sugar 1 time daily- E11.2  . Cholecalciferol (VITAMIN D-3) 5000 UNITS TABS Take 1 tablet by mouth daily.  Marland Kitchen gabapentin (NEURONTIN) 100 MG capsule TAKE 1 CAPSULE THREE TIMES DAILY FOR NEUROPATHY PAINS IN HANDS  . glimepiride (AMARYL) 4 MG tablet Take 1 tablet 2 x /day with Meals for Diabetes  . glucose blood (TRUE METRIX BLOOD GLUCOSE TEST) test strip Check blood sugar 1 time daily-E11.2  . hydrochlorothiazide (HYDRODIURIL) 25 MG tablet TAKE 1 TABLET DAILY FOR BLOOD PRESSURE AND FLUID  . losartan (COZAAR) 100 MG tablet TAKE 1 TABLET EVERY DAY FOR BLOOD PRESSURE AND DIABETIC KIDNEY PROTECTION  . Magnesium-Zinc (MAGNESIUM-CHELATED ZINC PO) Take 400 mg by mouth daily. Takes 1200 mg  daily.  . metFORMIN (GLUCOPHAGE-XR) 500 MG 24 hr tablet TAKE 2 TABLETS TWICE DAILY WITH MEALS FOR DIABETES  . Multiple Vitamins-Minerals (THERAGRAN-M PREMIER 50 PLUS PO) Take 1 tablet by mouth daily.  . Omega-3 Fatty Acids (FISH OIL) 500 MG CAPS Take by mouth daily.  . phentermine (ADIPEX-P) 37.5 MG tablet Take    1 tablet    every morning    for Dieting & Weight Loss  . tadalafil (CIALIS) 20 MG tablet TAKE ONE-HALF TO ONE TABLET BY MOUTH EVERY 2 TO 3 DAYS AS NEEDED  . TRUEplus Lancets 30G MISC Check blood sugar 1 time daily-E11.2   No current facility-administered medications on file prior to visit.     Allergies:  Allergies  Allergen Reactions  . Crestor  [Rosuvastatin Calcium] Other (See Comments)    Cramping     Medical History:  Past Medical History:  Diagnosis Date  . Diabetes mellitus without complication ([place])   . Hyperlipidemia, mixed 09/29/2012  . Hypertension   . Vitamin D deficiency    Family history- Reviewed and unchanged Social history- Reviewed and unchanged   Review of Systems:  Review of Systems  Constitutional: Negative for malaise/fatigue and weight loss.  HENT: Negative for hearing loss and tinnitus.   Eyes: Negative for blurred vision and double vision.  Respiratory: Negative for cough, shortness of breath and wheezing.   Cardiovascular: Negative for chest pain, palpitations, orthopnea, claudication and leg swelling.  Gastrointestinal: Negative for abdominal pain, blood in stool, constipation, diarrhea, heartburn, melena, nausea and vomiting.  Genitourinary: Negative.   Musculoskeletal: Positive for joint pain (bilateral knees). Negative for myalgias.  Skin: Negative for rash.  Neurological: Negative for dizziness, tingling, sensory change, weakness and headaches.  Endo/Heme/Allergies: Negative for polydipsia.  Psychiatric/Behavioral: Negative.   All other systems reviewed and are negative.   Physical Exam: BP 120/78   Pulse 96   Temp (!) 97.3 F (36.3 C)   Ht 5' 8.5" (1.74 m)   Wt 293 lb (132.9 kg)   SpO2 98%   BMI 43.90 kg/m  Wt Readings from Last 3 Encounters:  12/17/20 293 lb (132.9 kg)  09/26/20 290 lb (131.5 kg)  09/13/20 284 lb 6.4 oz (129 kg)   General Appearance: Morbidly obese AA male, well dressed, in no apparent distress. Eyes: PERRLA, EOMs, conjunctiva no swelling or erythema Sinuses: No Frontal/maxillary tenderness ENT/Mouth: Ext aud canals clear, TMs without erythema, bulging. No erythema, swelling, or exudate on post pharynx.  Tonsils not swollen or erythematous. Hearing normal.  Neck: Supple, thyroid normal.  Respiratory: Respiratory effort normal, BS equal bilaterally without  rales, rhonchi, wheezing or stridor.  Cardio: RRR with no MRGs. Diminished peripheral pulses without edema.  Abdomen: Soft, obese abdomen limits exam, + BS.  Non tender, no guarding, rebound, palpable hernias, masses. Lymphatics: Non tender without lymphadenopathy.  Musculoskeletal: Full ROM except left knee,wearing brace, no effusion. Mildly antalgic gait Skin: Warm, dry without rashes, lesions, ecchymosis.  Neuro: Cranial nerves intact. No cerebellar symptoms.  Psych: Awake and oriented X 3, normal affect, Insight and Judgment appropriate.    [name], NP 10:55 AM [facility]

## 2020-12-17 ENCOUNTER — Encounter: Payer: Self-pay | Admitting: Adult Health

## 2020-12-17 ENCOUNTER — Other Ambulatory Visit: Payer: Self-pay | Admitting: Adult Health

## 2020-12-17 ENCOUNTER — Ambulatory Visit (INDEPENDENT_AMBULATORY_CARE_PROVIDER_SITE_OTHER): Payer: Medicare HMO | Admitting: Adult Health

## 2020-12-17 ENCOUNTER — Other Ambulatory Visit: Payer: Self-pay

## 2020-12-17 VITALS — BP 120/78 | HR 96 | Temp 97.3°F | Ht 68.5 in | Wt 293.0 lb

## 2020-12-17 DIAGNOSIS — E559 Vitamin D deficiency, unspecified: Secondary | ICD-10-CM

## 2020-12-17 DIAGNOSIS — E1169 Type 2 diabetes mellitus with other specified complication: Secondary | ICD-10-CM

## 2020-12-17 DIAGNOSIS — I10 Essential (primary) hypertension: Secondary | ICD-10-CM | POA: Diagnosis not present

## 2020-12-17 DIAGNOSIS — Z79899 Other long term (current) drug therapy: Secondary | ICD-10-CM | POA: Diagnosis not present

## 2020-12-17 DIAGNOSIS — N521 Erectile dysfunction due to diseases classified elsewhere: Secondary | ICD-10-CM

## 2020-12-17 DIAGNOSIS — E785 Hyperlipidemia, unspecified: Secondary | ICD-10-CM

## 2020-12-17 DIAGNOSIS — N182 Chronic kidney disease, stage 2 (mild): Secondary | ICD-10-CM | POA: Diagnosis not present

## 2020-12-17 DIAGNOSIS — I723 Aneurysm of iliac artery: Secondary | ICD-10-CM | POA: Diagnosis not present

## 2020-12-17 DIAGNOSIS — E1122 Type 2 diabetes mellitus with diabetic chronic kidney disease: Secondary | ICD-10-CM | POA: Diagnosis not present

## 2020-12-17 DIAGNOSIS — E1142 Type 2 diabetes mellitus with diabetic polyneuropathy: Secondary | ICD-10-CM

## 2020-12-17 MED ORDER — OZEMPIC (0.25 OR 0.5 MG/DOSE) 2 MG/1.5ML ~~LOC~~ SOPN: PEN_INJECTOR | SUBCUTANEOUS | 0 refills | Status: DC

## 2020-12-17 MED ORDER — TRULICITY 0.75 MG/0.5ML ~~LOC~~ SOAJ: 0.7500 mg | SUBCUTANEOUS | 0 refills | Status: DC

## 2020-12-17 NOTE — Patient Instructions (Addendum)
Check with Y about water aerobics    Call insurance and ask if they cover ozempic   Sent in script to [facility] - store med in fridge  If you start ozempic, sugars will start to come down pretty quickly - recommend reduce glimepiride from 4 mg twice a day to 1/2 tab twice a day, but if any low sugars STOP and just do as needed if sugars are running high     High-Fiber Eating Plan Fiber, also called dietary fiber, is a type of carbohydrate. It is found foods such as fruits, vegetables, whole grains, and beans. A high-fiber diet can have many health benefits. Your health care provider may recommend a high-fiber diet to help:  Prevent constipation. Fiber can make your bowel movements more regular.  Lower your cholesterol.  Relieve the following conditions: ? Inflammation of veins in the anus (hemorrhoids). ? Inflammation of specific areas of the digestive tract (uncomplicated diverticulosis). ? A problem of the large intestine, also called the colon, that sometimes causes pain and diarrhea (irritable bowel syndrome, or IBS).  Prevent overeating as part of a weight-loss plan.  Prevent heart disease, type 2 diabetes, and certain cancers. What are tips for following this plan? Reading food labels  Check the nutrition facts label on food products for the amount of dietary fiber. Choose foods that have 5 grams of fiber or more per serving.  The goals for recommended daily fiber intake include: ? Men (age 57 or younger): 34-38 g. ? Men (over age 34): 28-34 g. ? Women (age 65 or younger): 25-28 g. ? Women (over age 23): 22-25 g. Your daily fiber goal is _____________ g.   Shopping  Choose whole fruits and vegetables instead of processed forms, such as apple juice or applesauce.  Choose a wide variety of high-fiber foods such as avocados, lentils, oats, and kidney beans.  Read the nutrition facts label of the foods you choose. Be aware of foods with added fiber. These foods  often have high sugar and sodium amounts per serving. Cooking  Use whole-grain flour for baking and cooking.  Cook with brown rice instead of white rice. Meal planning  Start the day with a breakfast that is high in fiber, such as a cereal that contains 5 g of fiber or more per serving.  Eat breads and cereals that are made with whole-grain flour instead of refined flour or white flour.  Eat brown rice, bulgur wheat, or millet instead of white rice.  Use beans in place of meat in soups, salads, and pasta dishes.  Be sure that half of the grains you eat each day are whole grains. General information  You can get the recommended daily intake of dietary fiber by: ? Eating a variety of fruits, vegetables, grains, nuts, and beans. ? Taking a fiber supplement if you are not able to take in enough fiber in your diet. It is better to get fiber through food than from a supplement.  Gradually increase how much fiber you consume. If you increase your intake of dietary fiber too quickly, you may have bloating, cramping, or gas.  Drink plenty of water to help you digest fiber.  Choose high-fiber snacks, such as berries, raw vegetables, nuts, and popcorn. What foods should I eat? Fruits Berries. Pears. Apples. Oranges. Avocado. Prunes and raisins. Dried figs. Vegetables Sweet potatoes. Spinach. Kale. Artichokes. Cabbage. Broccoli. Cauliflower. Green peas. Carrots. Squash. Grains Whole-grain breads. Multigrain cereal. Oats and oatmeal. Brown rice. Barley. Bulgur wheat. Millet.  Quinoa. Bran muffins. Popcorn. Rye wafer crackers. Meats and other proteins Navy beans, kidney beans, and pinto beans. Soybeans. Split peas. Lentils. Nuts and seeds. Dairy Fiber-fortified yogurt. Beverages Fiber-fortified soy milk. Fiber-fortified orange juice. Other foods Fiber bars. The items listed above may not be a complete list of recommended foods and beverages. Contact a dietitian for more information. What  foods should I avoid? Fruits Fruit juice. Cooked, strained fruit. Vegetables Fried potatoes. Canned vegetables. Well-cooked vegetables. Grains White bread. Pasta made with refined flour. White rice. Meats and other proteins Fatty cuts of meat. Fried chicken or fried fish. Dairy Milk. Yogurt. Cream cheese. Sour cream. Fats and oils Butters. Beverages Soft drinks. Other foods Cakes and pastries. The items listed above may not be a complete list of foods and beverages to avoid. Talk with your dietitian about what choices are best for you. Summary  Fiber is a type of carbohydrate. It is found in foods such as fruits, vegetables, whole grains, and beans.  A high-fiber diet has many benefits. It can help to prevent constipation, lower blood cholesterol, aid weight loss, and reduce your risk of heart disease, diabetes, and certain cancers.  Increase your intake of fiber gradually. Increasing fiber too quickly may cause cramping, bloating, and gas. Drink plenty of water while you increase the amount of fiber you consume.  The best sources of fiber include whole fruits and vegetables, whole grains, nuts, seeds, and beans. This information is not intended to replace advice given to you by your health care provider. Make sure you discuss any questions you have with your health care provider. Document Revised: 12/08/2019 Document Reviewed: 12/08/2019 Elsevier Patient Education  2021 Elsevier Inc.    Semaglutide injection solution What is this medicine? SEMAGLUTIDE (Sem a GLOO tide) is used to improve blood sugar control in adults with type 2 diabetes. This medicine may be used with other diabetes medicines. This drug may also reduce the risk of heart attack or stroke if you have type 2 diabetes and risk factors for heart disease. This medicine may be used for other purposes; ask your health care provider or pharmacist if you have questions. COMMON BRAND NAME(S): OZEMPIC What should I tell  my health care provider before I take this medicine? They need to know if you have any of these conditions:  endocrine tumors (MEN 2) or if someone in your family had these tumors  eye disease, vision problems  history of pancreatitis  kidney disease  stomach problems  thyroid cancer or if someone in your family had thyroid cancer  an unusual or allergic reaction to semaglutide, other medicines, foods, dyes, or preservatives  pregnant or trying to get pregnant  breast-feeding How should I use this medicine? This medicine is for injection under the skin of your upper leg (thigh), stomach area, or upper arm. It is given once every week (every 7 days). You will be taught how to prepare and give this medicine. Use exactly as directed. Take your medicine at regular intervals. Do not take it more often than directed. If you use this medicine with insulin, you should inject this medicine and the insulin separately. Do not mix them together. Do not give the injections right next to each other. Change (rotate) injection sites with each injection. It is important that you put your used needles and syringes in a special sharps container. Do not put them in a trash can. If you do not have a sharps container, call your pharmacist or healthcare provider to  get one. A special MedGuide will be given to you by the pharmacist with each prescription and refill. Be sure to read this information carefully each time. This drug comes with INSTRUCTIONS FOR USE. Ask your pharmacist for directions on how to use this drug. Read the information carefully. Talk to your pharmacist or health care provider if you have questions. Talk to your pediatrician regarding the use of this medicine in children. Special care may be needed. Overdosage: If you think you have taken too much of this medicine contact a poison control center or emergency room at once. NOTE: This medicine is only for you. Do not share this medicine with  others. What if I miss a dose? If you miss a dose, take it as soon as you can within 5 days after the missed dose. Then take your next dose at your regular weekly time. If it has been longer than 5 days after the missed dose, do not take the missed dose. Take the next dose at your regular time. Do not take double or extra doses. If you have questions about a missed dose, contact your health care provider for advice. What may interact with this medicine?  other medicines for diabetes Many medications may cause changes in blood sugar, these include:  alcohol containing beverages  antiviral medicines for HIV or AIDS  aspirin and aspirin-like drugs  certain medicines for blood pressure, heart disease, irregular heart beat  chromium  diuretics  male hormones, such as estrogens or progestins, birth control pills  fenofibrate  gemfibrozil  isoniazid  lanreotide  male hormones or anabolic steroids  MAOIs like Carbex, Eldepryl, Marplan, Nardil, and Parnate  medicines for weight loss  medicines for allergies, asthma, cold, or cough  medicines for depression, anxiety, or psychotic disturbances  niacin  nicotine  NSAIDs, medicines for pain and inflammation, like ibuprofen or naproxen  octreotide  pasireotide  pentamidine  phenytoin  probenecid  quinolone antibiotics such as ciprofloxacin, levofloxacin, ofloxacin  some herbal dietary supplements  steroid medicines such as prednisone or cortisone  sulfamethoxazole; trimethoprim  thyroid hormones Some medications can hide the warning symptoms of low blood sugar (hypoglycemia). You may need to monitor your blood sugar more closely if you are taking one of these medications. These include:  beta-blockers, often used for high blood pressure or heart problems (examples include atenolol, metoprolol, propranolol)  clonidine  guanethidine  reserpine This list may not describe all possible interactions. Give your  health care provider a list of all the medicines, herbs, non-prescription drugs, or dietary supplements you use. Also tell them if you smoke, drink alcohol, or use illegal drugs. Some items may interact with your medicine. What should I watch for while using this medicine? Visit your doctor or health care professional for regular checks on your progress. Drink plenty of fluids while taking this medicine. Check with your doctor or health care professional if you get an attack of severe diarrhea, nausea, and vomiting. The loss of too much body fluid can make it dangerous for you to take this medicine. A test called the HbA1C (A1C) will be monitored. This is a simple blood test. It measures your blood sugar control over the last 2 to 3 months. You will receive this test every 3 to 6 months. Learn how to check your blood sugar. Learn the symptoms of low and high blood sugar and how to manage them. Always carry a quick-source of sugar with you in case you have symptoms of low blood sugar.  Examples include hard sugar candy or glucose tablets. Make sure others know that you can choke if you eat or drink when you develop serious symptoms of low blood sugar, such as seizures or unconsciousness. They must get medical help at once. Tell your doctor or health care professional if you have high blood sugar. You might need to change the dose of your medicine. If you are sick or exercising more than usual, you might need to change the dose of your medicine. Do not skip meals. Ask your doctor or health care professional if you should avoid alcohol. Many nonprescription cough and cold products contain sugar or alcohol. These can affect blood sugar. Pens should never be shared. Even if the needle is changed, sharing may result in passing of viruses like hepatitis or HIV. Wear a medical ID bracelet or chain, and carry a card that describes your disease and details of your medicine and dosage times. Do not become pregnant  while taking this medicine. Women should inform their doctor if they wish to become pregnant or think they might be pregnant. There is a potential for serious side effects to an unborn child. Talk to your health care professional or pharmacist for more information. What side effects may I notice from receiving this medicine? Side effects that you should report to your doctor or health care professional as soon as possible:  allergic reactions like skin rash, itching or hives, swelling of the face, lips, or tongue  breathing problems  changes in vision  diarrhea that continues or is severe  lump or swelling on the neck  severe nausea  signs and symptoms of infection like fever or chills; cough; sore throat; pain or trouble passing urine  signs and symptoms of low blood sugar such as feeling anxious, confusion, dizziness, increased hunger, unusually weak or tired, sweating, shakiness, cold, irritable, headache, blurred vision, fast heartbeat, loss of consciousness  signs and symptoms of kidney injury like trouble passing urine or change in the amount of urine  trouble swallowing  unusual stomach upset or pain  vomiting Side effects that usually do not require medical attention (report to your doctor or health care professional if they continue or are bothersome):  constipation  diarrhea  nausea  pain, redness, or irritation at site where injected  stomach upset This list may not describe all possible side effects. Call your doctor for medical advice about side effects. You may report side effects to FDA at 1-800-FDA-1088. Where should I keep my medicine? Keep out of the reach of children. Store unopened pens in a refrigerator between 2 and 8 degrees C (36 and 46 degrees F). Do not freeze. Protect from light and heat. After you first use the pen, it can be stored for 56 days at room temperature between 15 and 30 degrees C (59 and 86 degrees F) or in a refrigerator. Throw away  your used pen after 56 days or after the expiration date, whichever comes first. Do not store your pen with the needle attached. If the needle is left on, medicine may leak from the pen. NOTE: This sheet is a summary. It may not cover all possible information. If you have questions about this medicine, talk to your doctor, pharmacist, or health care provider.  2021 Elsevier/Gold Standard (2019-04-19 09:41:51)

## 2020-12-17 NOTE — Progress Notes (Signed)
Patient contacted office back reporting $90 copay for ozempic, not affordable; he is agreeable to trying trulicity instead, will initiate paperwork for [employer] assistance.

## 2020-12-18 ENCOUNTER — Other Ambulatory Visit: Payer: Self-pay

## 2020-12-18 LAB — CBC WITH DIFFERENTIAL/PLATELET
Absolute Monocytes: 510 cells/uL (ref 200–950)
Basophils Absolute: 62 cells/uL (ref 0–200)
Basophils Relative: 1.2 %
Eosinophils Absolute: 208 cells/uL (ref 15–500)
Eosinophils Relative: 4 %
HCT: 42.1 % (ref 38.5–50.0)
Hemoglobin: 13.3 g/dL (ref 13.2–17.1)
Lymphs Abs: 2075 cells/uL (ref 850–3900)
MCH: 26.3 pg — ABNORMAL LOW (ref 27.0–33.0)
MCHC: 31.6 g/dL — ABNORMAL LOW (ref 32.0–36.0)
MCV: 83.4 fL (ref 80.0–100.0)
MPV: 9.8 fL (ref 7.5–12.5)
Monocytes Relative: 9.8 %
Neutro Abs: 2345 cells/uL (ref 1500–7800)
Neutrophils Relative %: 45.1 %
Platelets: 339 10*3/uL (ref 140–400)
RBC: 5.05 10*6/uL (ref 4.20–5.80)
RDW: 13.2 % (ref 11.0–15.0)
Total Lymphocyte: 39.9 %
WBC: 5.2 10*3/uL (ref 3.8–10.8)

## 2020-12-18 LAB — COMPLETE METABOLIC PANEL WITH GFR
AG Ratio: 1.2 (calc) (ref 1.0–2.5)
ALT: 11 U/L (ref 9–46)
AST: 23 U/L (ref 10–35)
Albumin: 4.3 g/dL (ref 3.6–5.1)
Alkaline phosphatase (APISO): 68 U/L (ref 35–144)
BUN: 23 mg/dL (ref 7–25)
CO2: 27 mmol/L (ref 20–32)
Calcium: 9.9 mg/dL (ref 8.6–10.3)
Chloride: 104 mmol/L (ref 98–110)
Creat: 1.13 mg/dL (ref 0.70–1.25)
GFR, Est African American: 79 mL/min/{1.73_m2} (ref >=60)
GFR, Est Non African American: 68 mL/min/{1.73_m2} (ref >=60)
Globulin: 3.5 g/dL (calc) (ref 1.9–3.7)
Glucose, Bld: 116 mg/dL — ABNORMAL HIGH (ref 65–99)
Potassium: 4.4 mmol/L (ref 3.5–5.3)
Sodium: 140 mmol/L (ref 135–146)
Total Bilirubin: 0.4 mg/dL (ref 0.2–1.2)
Total Protein: 7.8 g/dL (ref 6.1–8.1)

## 2020-12-18 LAB — TSH: TSH: 1.41 mIU/L (ref 0.40–4.50)

## 2020-12-18 LAB — LIPID PANEL
Cholesterol: 216 mg/dL — ABNORMAL HIGH (ref <200)
HDL: 45 mg/dL (ref >=40)
LDL Cholesterol (Calc): 148 mg/dL (calc) — ABNORMAL HIGH
Non-HDL Cholesterol (Calc): 171 mg/dL (calc) — ABNORMAL HIGH (ref <130)
Total CHOL/HDL Ratio: 4.8 (calc) (ref <5.0)
Triglycerides: 111 mg/dL (ref <150)

## 2020-12-18 LAB — HEMOGLOBIN A1C
Hgb A1c MFr Bld: 8.6 % of total Hgb — ABNORMAL HIGH (ref <5.7)
Mean Plasma Glucose: 200 mg/dL
eAG (mmol/L): 11.1 mmol/L

## 2020-12-18 LAB — MAGNESIUM: Magnesium: 1.9 mg/dL (ref 1.5–2.5)

## 2020-12-18 MED ORDER — TRULICITY 0.75 MG/0.5ML ~~LOC~~ SOAJ: 0.7500 mg | SUBCUTANEOUS | 0 refills | Status: DC

## 2020-12-24 ENCOUNTER — Telehealth: Payer: Self-pay

## 2020-12-24 NOTE — Telephone Encounter (Signed)
Patient's Trulicity medication meets the [employer] requirement and is enrolled in the program until the end of the calendar year.

## 2020-12-25 ENCOUNTER — Ambulatory Visit
Admission: RE | Admit: 2020-12-25 | Discharge: 2020-12-25 | Disposition: A | Discharge status: Home / Self Care | Payer: Medicare HMO | Source: Ambulatory Visit | Attending: Adult Health | Admitting: Adult Health

## 2020-12-25 DIAGNOSIS — I723 Aneurysm of iliac artery: Secondary | ICD-10-CM

## 2021-01-11 ENCOUNTER — Other Ambulatory Visit: Payer: Self-pay | Admitting: Internal Medicine

## 2021-01-25 ENCOUNTER — Other Ambulatory Visit: Payer: Self-pay | Admitting: Internal Medicine

## 2021-02-15 ENCOUNTER — Other Ambulatory Visit: Payer: Self-pay | Admitting: Adult Health

## 2021-02-15 DIAGNOSIS — N182 Chronic kidney disease, stage 2 (mild): Secondary | ICD-10-CM

## 2021-02-15 DIAGNOSIS — E1122 Type 2 diabetes mellitus with diabetic chronic kidney disease: Secondary | ICD-10-CM

## 2021-03-20 NOTE — Progress Notes (Signed)
Future Appointments  Date Time Provider [place]  03/21/2021  - 6 mo  10:30 AM Unk Pinto, MD [facility] None  06/10/2021 - Wellness 10:30 AM Garnet Sierras, NP [facility] None  09/24/2021  - CPE  11:00 AM Unk Pinto, MD [facility] None    History of Present Illness:       This very nice 65 y.o. single BM presents for 6 month follow up with HTN, HLD, Pre-Diabetes and Vitamin D Deficiency.        Patient is treated for HTN (2007) & BP has been controlled at home. Today's BP is at goal - 128/78. Patient has had no complaints of any cardiac type chest pain, palpitations, dyspnea / orthopnea / PND, dizziness, claudication, or dependent edema.       Hyperlipidemia is not controlled with diet & meds (alleges Statin Intolerance). Patient denies myalgias or other med SE's. Last Lipids were not at goal:   Lab Results  Component Value Date   CHOL 216 (H) 12/17/2020   HDL 45 12/17/2020   LDLCALC 148 (H) 12/17/2020   TRIG 111 12/17/2020   CHOLHDL 4.8 12/17/2020     Also, the patient has  Morbid Obesity (BMI 43.1+)  history of T2_NIDDM (2014) w/CKD2 (GFR 68)  and has had no symptoms of reactive hypoglycemia, diabetic polys, paresthesias or visual blurring.  Last A1c was not at goal:  Lab Results  Component Value Date   HGBA1C 8.6 (H) 12/17/2020                                                        Further, the patient also has history of Vitamin D Deficiency and supplements vitamin D without any suspected side-effects. Last vitamin D was at goal:   Lab Results  Component Value Date   VD25OH 62 09/13/2020     Current Outpatient Medications on File Prior to Visit  Medication Sig   Alcohol Swabs PADS Use to check blood sugar 1 time daily-E11.2   Ascorbic Acid (VITA-C PO) Take by mouth.   aspirin EC 81 MG tablet Take 81 mg by mouth daily.   Blood Glucose Calibration (TRUE METRIX LEVEL 1) Low SOLN Use with glucometer. Check blood sugar 1 time daily-E11.2.    Blood Glucose Monitoring Suppl (TRUE METRIX METER) w/Device KIT Check blood sugar 1 time daily- E11.2   Cholecalciferol (VITAMIN D-3) 5000 UNITS TABS Take 1 tablet by mouth daily.   Dulaglutide (TRULICITY) 6.38 LH/7.3SK SOPN Inject 0.75 mg into the skin once a week.   gabapentin (NEURONTIN) 100 MG capsule TAKE 1 CAPSULE THREE TIMES DAILY FOR NEUROPATHY PAINS IN HANDS   glimepiride (AMARYL) 4 MG tablet TAKE 1 TABLET TWICE DAILY WITH MEALS FOR DIABETES   glucose blood (TRUE METRIX BLOOD GLUCOSE TEST) test strip CHECK BLOOD SUGAR 1 TIME DAILY   hydrochlorothiazide (HYDRODIURIL) 25 MG tablet TAKE 1 TABLET DAILY FOR BLOOD PRESSURE AND FLUID   losartan (COZAAR) 100 MG tablet TAKE 1 TABLET EVERY DAY FOR BLOOD PRESSURE AND DIABETIC KIDNEY PROTECTION   Magnesium-Zinc (MAGNESIUM-CHELATED ZINC PO) Take 400 mg by mouth daily. Takes 1200 mg daily.   metFORMIN (GLUCOPHAGE-XR) 500 MG 24 hr tablet TAKE 2 TABLETS TWICE DAILY WITH MEALS FOR DIABETES   Multiple Vitamins-Minerals (THERAGRAN-M PREMIER 50 PLUS PO) Take 1 tablet by mouth daily.   Omega-3  Fatty Acids (FISH OIL) 500 MG CAPS Take by mouth daily.   phentermine (ADIPEX-P) 37.5 MG tablet Take    1 tablet    every morning    for Dieting & Weight Loss   tadalafil (CIALIS) 20 MG tablet TAKE ONE-HALF TO ONE TABLET BY MOUTH EVERY 2 TO 3 DAYS AS NEEDED   TRUEplus Lancets 30G MISC CHECK BLOOD SUGAR 1 TIME DAILY   No current facility-administered medications on file prior to visit.     Allergies  Allergen Reactions   Crestor [Rosuvastatin Calcium] Other (See Comments)    Cramping     PMHx:   Past Medical History:  Diagnosis Date   Diabetes mellitus without complication ([place])    Hyperlipidemia, mixed 09/29/2012   Hypertension    Vitamin D deficiency      Immunization History  Administered Date(s) Administered   Pneumococcal-23 02/16/2013   Tdap 02/10/2011     Past Surgical History:  Procedure Laterality Date   CARPAL TUNNEL RELEASE  2008    bilateral   CERVICAL DISCECTOMY  2003   KNEE ARTHROSCOPY  2010   left    FHx:    Reviewed / unchanged  SHx:    Reviewed / unchanged   Systems Review:  Constitutional: Denies fever, chills, wt changes, headaches, insomnia, fatigue, night sweats, change in appetite. Eyes: Denies redness, blurred vision, diplopia, discharge, itchy, watery eyes.  ENT: Denies discharge, congestion, post nasal drip, epistaxis, sore throat, earache, hearing loss, dental pain, tinnitus, vertigo, sinus pain, snoring.  CV: Denies chest pain, palpitations, irregular heartbeat, syncope, dyspnea, diaphoresis, orthopnea, PND, claudication or edema. Respiratory: denies cough, dyspnea, DOE, pleurisy, hoarseness, laryngitis, wheezing.  Gastrointestinal: Denies dysphagia, odynophagia, heartburn, reflux, water brash, abdominal pain or cramps, nausea, vomiting, bloating, diarrhea, constipation, hematemesis, melena, hematochezia  or hemorrhoids. Genitourinary: Denies dysuria, frequency, urgency, nocturia, hesitancy, discharge, hematuria or flank pain. Musculoskeletal: Denies arthralgias, myalgias, stiffness, jt. swelling, pain, limping or strain/sprain.  Skin: Denies pruritus, rash, hives, warts, acne, eczema or change in skin lesion(s). Neuro: No weakness, tremor, incoordination, spasms, paresthesia or pain. Psychiatric: Denies confusion, memory loss or sensory loss. Endo: Denies change in weight, skin or hair change.  Heme/Lymph: No excessive bleeding, bruising or enlarged lymph nodes.  Physical Exam  BP 128/78   Pulse 89   Temp (!) 97.1 F (36.2 C)   Resp 16   Ht 5' 8.5" (1.74 m)   Wt 288 lb (130.6 kg)   SpO2 96%   BMI 43.15 kg/m   Appears  well nourished, well groomed  and in no distress.  Eyes: PERRLA, EOMs, conjunctiva no swelling or erythema. Sinuses: No frontal/maxillary tenderness ENT/Mouth: EAC's clear, TM's nl w/o erythema, bulging. Nares clear w/o erythema, swelling, exudates. Oropharynx clear  without erythema or exudates. Oral hygiene is good. Tongue normal, non obstructing. Hearing intact.  Neck: Supple. Thyroid not palpable. Car 2+/2+ without bruits, nodes or JVD. Chest: Respirations nl with BS clear & equal w/o rales, rhonchi, wheezing or stridor.  Cor: Heart sounds normal w/ regular rate and rhythm without sig. murmurs, gallops, clicks or rubs. Peripheral pulses normal and equal  without edema.  Abdomen: Soft & bowel sounds normal. Non-tender w/o guarding, rebound, hernias, masses or organomegaly.  Lymphatics: Unremarkable.  Musculoskeletal: Full ROM all peripheral extremities, joint stability, 5/5 strength and normal gait.  Skin: Warm, dry without exposed rashes, lesions or ecchymosis apparent.  Neuro: Cranial nerves intact, reflexes equal bilaterally. Sensory-motor testing grossly intact. Tendon reflexes grossly intact.  Pysch: Alert & oriented  x 3.  Insight and judgement nl & appropriate. No ideations.  Assessment and Plan:  1. Essential hypertension  - Continue medication, monitor blood pressure at home.  - Continue DASH diet.  Reminder to go to the ER if any CP,  SOB, nausea, dizziness, severe HA, changes vision/speech.   - CBC with Differential/Platelet - COMPLETE METABOLIC PANEL WITH GFR - Magnesium - TSH  2. Hyperlipidemia associated with type 2 diabetes mellitus ([place])  - Continue diet/meds, exercise,& lifestyle modifications.  - Continue monitor periodic cholesterol/liver & renal functions    - Lipid panel - TSH  3. Type 2 diabetes mellitus with stage 2 chronic kidney  disease, without long-term current use of insulin ([facility])  - Hemoglobin A1c - Insulin, random  4. Vitamin D deficiency  - Continue diet, exercise  - Lifestyle modifications.  - Monitor appropriate labs. - Continue supplementation.    5. Poorly controlled diabetes mellitus ([place])  - Hemoglobin A1c - Insulin, random  6. Peripheral sensory neuropathy due to T2_NIDDM  - Hemoglobin  A1c - Insulin, random  7. Morbid obesity with BMI of 40.0-44.9, adult (HCC)  - TSH  8. Medication management  - CBC with Differential/Platelet - COMPLETE METABOLIC PANEL WITH GFR - Magnesium - Lipid panel - TSH - Hemoglobin A1c - Insulin, random        Discussed  regular exercise, BP monitoring, weight control to achieve/maintain BMI less than 25 and discussed med and SE's. Recommended labs to assess and monitor clinical status with further disposition pending results of labs.  I discussed the assessment and treatment plan with the patient. The patient was provided an opportunity to ask questions and all were answered. The patient agreed with the plan and demonstrated an understanding of the instructions.  I provided over 30 minutes of exam, counseling, chart review and  complex critical decision making.        The patient was advised to call back or seek an in-person evaluation if the symptoms worsen or if the condition fails to improve as anticipated.   Kirtland Bouchard, MD

## 2021-03-21 ENCOUNTER — Encounter: Payer: Self-pay | Admitting: Internal Medicine

## 2021-03-21 ENCOUNTER — Ambulatory Visit (INDEPENDENT_AMBULATORY_CARE_PROVIDER_SITE_OTHER): Payer: Medicare HMO | Admitting: Internal Medicine

## 2021-03-21 ENCOUNTER — Other Ambulatory Visit: Payer: Self-pay

## 2021-03-21 VITALS — BP 128/78 | HR 89 | Temp 97.1°F | Resp 16 | Ht 68.5 in | Wt 288.0 lb

## 2021-03-21 DIAGNOSIS — N182 Chronic kidney disease, stage 2 (mild): Secondary | ICD-10-CM | POA: Diagnosis not present

## 2021-03-21 DIAGNOSIS — I10 Essential (primary) hypertension: Secondary | ICD-10-CM

## 2021-03-21 DIAGNOSIS — E1122 Type 2 diabetes mellitus with diabetic chronic kidney disease: Secondary | ICD-10-CM

## 2021-03-21 DIAGNOSIS — E1142 Type 2 diabetes mellitus with diabetic polyneuropathy: Secondary | ICD-10-CM | POA: Diagnosis not present

## 2021-03-21 DIAGNOSIS — Z79899 Other long term (current) drug therapy: Secondary | ICD-10-CM

## 2021-03-21 DIAGNOSIS — Z6841 Body Mass Index (BMI) 40.0 and over, adult: Secondary | ICD-10-CM

## 2021-03-21 DIAGNOSIS — E1165 Type 2 diabetes mellitus with hyperglycemia: Secondary | ICD-10-CM

## 2021-03-21 DIAGNOSIS — E785 Hyperlipidemia, unspecified: Secondary | ICD-10-CM | POA: Diagnosis not present

## 2021-03-21 DIAGNOSIS — E1169 Type 2 diabetes mellitus with other specified complication: Secondary | ICD-10-CM | POA: Diagnosis not present

## 2021-03-21 DIAGNOSIS — E559 Vitamin D deficiency, unspecified: Secondary | ICD-10-CM

## 2021-03-21 NOTE — Patient Instructions (Signed)

## 2021-03-22 LAB — CBC WITH DIFFERENTIAL/PLATELET
Absolute Monocytes: 581 cells/uL (ref 200–950)
Basophils Absolute: 51 cells/uL (ref 0–200)
Basophils Relative: 1 %
Eosinophils Absolute: 163 cells/uL (ref 15–500)
Eosinophils Relative: 3.2 %
HCT: 41 % (ref 38.5–50.0)
Hemoglobin: 13.3 g/dL (ref 13.2–17.1)
Lymphs Abs: 2382 cells/uL (ref 850–3900)
MCH: 26.4 pg — ABNORMAL LOW (ref 27.0–33.0)
MCHC: 32.4 g/dL (ref 32.0–36.0)
MCV: 81.3 fL (ref 80.0–100.0)
MPV: 9.5 fL (ref 7.5–12.5)
Monocytes Relative: 11.4 %
Neutro Abs: 1923 cells/uL (ref 1500–7800)
Neutrophils Relative %: 37.7 %
Platelets: 330 10*3/uL (ref 140–400)
RBC: 5.04 10*6/uL (ref 4.20–5.80)
RDW: 13.1 % (ref 11.0–15.0)
Total Lymphocyte: 46.7 %
WBC: 5.1 10*3/uL (ref 3.8–10.8)

## 2021-03-22 LAB — COMPLETE METABOLIC PANEL WITH GFR
AG Ratio: 1.3 (calc) (ref 1.0–2.5)
ALT: 8 U/L — ABNORMAL LOW (ref 9–46)
AST: 18 U/L (ref 10–35)
Albumin: 4.4 g/dL (ref 3.6–5.1)
Alkaline phosphatase (APISO): 66 U/L (ref 35–144)
BUN/Creatinine Ratio: 24 (calc) — ABNORMAL HIGH (ref 6–22)
BUN: 29 mg/dL — ABNORMAL HIGH (ref 7–25)
CO2: 28 mmol/L (ref 20–32)
Calcium: 9.9 mg/dL (ref 8.6–10.3)
Chloride: 101 mmol/L (ref 98–110)
Creat: 1.21 mg/dL (ref 0.70–1.35)
Globulin: 3.4 g/dL (calc) (ref 1.9–3.7)
Glucose, Bld: 50 mg/dL — ABNORMAL LOW (ref 65–99)
Potassium: 4.2 mmol/L (ref 3.5–5.3)
Sodium: 138 mmol/L (ref 135–146)
Total Bilirubin: 0.4 mg/dL (ref 0.2–1.2)
Total Protein: 7.8 g/dL (ref 6.1–8.1)
eGFR: 66 mL/min/{1.73_m2} (ref >=60)

## 2021-03-22 LAB — LIPID PANEL
Cholesterol: 213 mg/dL — ABNORMAL HIGH (ref <200)
HDL: 48 mg/dL (ref >=40)
LDL Cholesterol (Calc): 146 mg/dL (calc) — ABNORMAL HIGH
Non-HDL Cholesterol (Calc): 165 mg/dL (calc) — ABNORMAL HIGH (ref <130)
Total CHOL/HDL Ratio: 4.4 (calc) (ref <5.0)
Triglycerides: 88 mg/dL (ref <150)

## 2021-03-22 LAB — HEMOGLOBIN A1C
Hgb A1c MFr Bld: 6.3 % of total Hgb — ABNORMAL HIGH (ref <5.7)
Mean Plasma Glucose: 134 mg/dL
eAG (mmol/L): 7.4 mmol/L

## 2021-03-22 LAB — TSH: TSH: 1.83 mIU/L (ref 0.40–4.50)

## 2021-03-22 LAB — VITAMIN D 25 HYDROXY (VIT D DEFICIENCY, FRACTURES): Vit D, 25-Hydroxy: 87 ng/mL (ref 30–100)

## 2021-03-22 LAB — INSULIN, RANDOM: Insulin: 16.2 u[IU]/mL

## 2021-03-22 LAB — MAGNESIUM: Magnesium: 1.9 mg/dL (ref 1.5–2.5)

## 2021-03-23 ENCOUNTER — Encounter: Payer: Self-pay | Admitting: Internal Medicine

## 2021-03-23 NOTE — Progress Notes (Signed)
============================================================ ============================================================  -    Total Chol = 213 - ELEVATED         (  Ideal or Goal is less than 180  !   )  - AND Bad /Dangerous  LDL Chol = 146 - Very ELEVATED ( Bad )          (  Ideal or Goal is less than 70  !  )  - You must work Much harder on a stricter diet & Weight loss before   you have a Heart Attack, Stroke, Get Cancer or develop Vascular Dementia  !   - Cholesterol only comes from animal sources  - ie. meat, dairy, egg yolks  - Eat all the vegetables you want.  - Avoid meat, especially red meat - Beef AND Pork .  - Avoid cheese & dairy - milk & ice cream.     - Cheese is the most concentrated form of trans-fats which  is the worst thing to clog up our arteries.   - Veggie cheese is OK which can be found in the fresh  produce section at [employer] or [employer] or [employer] ============================================================ ============================================================  - A1c is Much better - do=wn from 13.7% , then 8.6% and                                                                                 Now down to 6.3%  (Bravo)   Getting much closer to Normal  (less than 5.7%)  - So keep up the [place] Work  ! ============================================================ ============================================================  - Vitamin D = 87 - Excellent  ! ============================================================ ============================================================  - All Else - CBC - Kidneys - Electrolytes - Liver - Magnesium & Thyroid    - all  Normal / OK ============================================================ ============================================================

## 2021-03-25 ENCOUNTER — Other Ambulatory Visit: Payer: Self-pay | Admitting: Internal Medicine

## 2021-03-25 DIAGNOSIS — I10 Essential (primary) hypertension: Secondary | ICD-10-CM

## 2021-03-26 ENCOUNTER — Emergency Department (HOSPITAL_COMMUNITY)
Admission: EM | Admit: 2021-03-26 | Discharge: 2021-03-26 | Disposition: A | Discharge status: Home / Self Care | Payer: Medicare HMO | Attending: Emergency Medicine | Admitting: Emergency Medicine

## 2021-03-26 ENCOUNTER — Other Ambulatory Visit: Payer: Self-pay

## 2021-03-26 ENCOUNTER — Emergency Department (HOSPITAL_COMMUNITY): Payer: Medicare HMO

## 2021-03-26 ENCOUNTER — Encounter (HOSPITAL_COMMUNITY): Payer: Self-pay | Admitting: *Deleted

## 2021-03-26 DIAGNOSIS — S339XXA Sprain of unspecified parts of lumbar spine and pelvis, initial encounter: Secondary | ICD-10-CM | POA: Insufficient documentation

## 2021-03-26 DIAGNOSIS — Y929 Unspecified place or not applicable: Secondary | ICD-10-CM | POA: Diagnosis not present

## 2021-03-26 DIAGNOSIS — Z7984 Long term (current) use of oral hypoglycemic drugs: Secondary | ICD-10-CM | POA: Diagnosis not present

## 2021-03-26 DIAGNOSIS — Z7982 Long term (current) use of aspirin: Secondary | ICD-10-CM | POA: Insufficient documentation

## 2021-03-26 DIAGNOSIS — S39012A Strain of muscle, fascia and tendon of lower back, initial encounter: Secondary | ICD-10-CM

## 2021-03-26 DIAGNOSIS — E119 Type 2 diabetes mellitus without complications: Secondary | ICD-10-CM | POA: Diagnosis not present

## 2021-03-26 DIAGNOSIS — Y999 Unspecified external cause status: Secondary | ICD-10-CM | POA: Diagnosis not present

## 2021-03-26 DIAGNOSIS — S3992XA Unspecified injury of lower back, initial encounter: Secondary | ICD-10-CM | POA: Diagnosis present

## 2021-03-26 DIAGNOSIS — X501XXA Overexertion from prolonged static or awkward postures, initial encounter: Secondary | ICD-10-CM | POA: Insufficient documentation

## 2021-03-26 DIAGNOSIS — Y9389 Activity, other specified: Secondary | ICD-10-CM | POA: Diagnosis not present

## 2021-03-26 DIAGNOSIS — Z87891 Personal history of nicotine dependence: Secondary | ICD-10-CM | POA: Diagnosis not present

## 2021-03-26 DIAGNOSIS — N182 Chronic kidney disease, stage 2 (mild): Secondary | ICD-10-CM | POA: Diagnosis not present

## 2021-03-26 DIAGNOSIS — I129 Hypertensive chronic kidney disease with stage 1 through stage 4 chronic kidney disease, or unspecified chronic kidney disease: Secondary | ICD-10-CM | POA: Insufficient documentation

## 2021-03-26 DIAGNOSIS — M545 Low back pain, unspecified: Secondary | ICD-10-CM | POA: Diagnosis not present

## 2021-03-26 MED ORDER — METHOCARBAMOL 500 MG PO TABS: 500.0000 mg | ORAL_TABLET | Freq: Two times a day (BID) | ORAL | 0 refills | Status: DC

## 2021-03-26 MED ORDER — DICLOFENAC SODIUM 1 % EX GEL: 2.0000 g | Freq: Four times a day (QID) | CUTANEOUS | 0 refills | Status: DC

## 2021-03-26 MED ORDER — ACETAMINOPHEN 500 MG PO TABS
1000.0000 mg | ORAL_TABLET | Freq: Once | ORAL | Status: AC
  Administered 2021-03-26: 1000 mg via ORAL
  Filled 2021-03-26: qty 2

## 2021-03-26 NOTE — Discharge Instructions (Addendum)
Take medications as needed. Follow-up with your primary care provider. Return to the ER if you start to experience worsening back pain, numbness in your arms or legs, losing control of your bowels or bladder, fever or chest pain.

## 2021-03-26 NOTE — ED Triage Notes (Signed)
Pt complains of lower back pain x 2 days. Reports laying on ground and putting spare tire on car on 8/7.

## 2021-03-26 NOTE — ED Provider Notes (Signed)
[facility] DEPT Provider Note   CSN: [id number] Arrival date & time: 03/26/21  0354     History Chief Complaint  Patient presents with   Back Pain    [name] [name] is a 65 y.o. male with a past medical history of diabetes, hypertension presenting to the ED with a chief complaint of back pain.  Symptoms began 2 days ago.  States that he laid on the ground changing a flat tire when symptoms began.  He did not have pain on the day of this incident but started having pain yesterday which worsened today.  It is worse with movement and palpation.  He tried taking 500 mg of Tylenol in his usual gabapentin with only minimal improvement in his symptoms.  No direct injury or trauma.  No prior back surgeries.  No numbness, loss of bowel or bladder function, saddle anesthesia, fever, chest pain or changes to gait.  HPI     Past Medical History:  Diagnosis Date   Diabetes mellitus without complication ([place])    Hyperlipidemia, mixed 09/29/2012   Hypertension    Vitamin D deficiency     Patient Active Problem List   Diagnosis Date Noted   CKD stage 2 due to type 2 diabetes mellitus ([place]) 11/16/2018   Arthritis of right knee 05/06/2018   Gluttony 07/19/2017   Chronic lower back pain 04/16/2017   Aneurysm artery, iliac common ([place]) 01/23/2015   Chronic pain syndrome 01/23/2015   Peripheral sensory neuropathy due to T2_NIDDM 01/23/2015   Erectile dysfunction associated with type 2 diabetes mellitus ([place]) 01/23/2015   T2_NIDDM w/ Stage 2 CKD ([place]) 06/06/2014   Medication management 06/06/2014   Vitamin D deficiency    HTN (hypertension) 09/29/2012   Hyperlipidemia associated with type 2 diabetes mellitus ([place]) 09/29/2012   GERD (gastroesophageal reflux disease) 09/29/2012   Morbid obesity (BMI 40+) 09/29/2012    Past Surgical History:  Procedure Laterality Date   CARPAL TUNNEL RELEASE  2008   bilateral   CERVICAL DISCECTOMY  2003   KNEE ARTHROSCOPY   2010   left       Family History  Problem Relation Age of Onset   Diabetes Father    Hypertension Father    Diabetes Sister    Kidney disease Sister    Heart disease Mother    Stroke Mother    Diabetes Mother    Colon cancer Neg Hx    Stomach cancer Neg Hx     Social History   Tobacco Use   Smoking status: Former    Types: Cigarettes    Quit date: 02/25/1991    Years since quitting: 30.1   Smokeless tobacco: Never  Vaping Use   Vaping Use: Never used  Substance Use Topics   Alcohol use: Not Currently   Drug use: No    Home Medications Prior to Admission medications   Medication Sig Start Date End Date Taking? Authorizing Provider  diclofenac Sodium (VOLTAREN) 1 % GEL Apply 2 g topically 4 (four) times daily. 03/26/21  Yes Keshawn Fiorito, PA-C  methocarbamol (ROBAXIN) 500 MG tablet Take 1 tablet (500 mg total) by mouth 2 (two) times daily. 03/26/21  Yes Dione Petron, PA-C  Alcohol Swabs PADS Use to check blood sugar 1 time daily-E11.2 03/26/20   Unk Pinto, MD  Ascorbic Acid (VITA-C PO) Take by mouth.    [provider]  aspirin EC 81 MG tablet Take 81 mg by mouth daily.    [provider]  Blood Glucose Calibration (TRUE METRIX LEVEL 1) Low SOLN Use with glucometer. Check blood sugar 1 time daily-E11.2. 03/30/20   Unk Pinto, MD  Blood Glucose Monitoring Suppl (TRUE METRIX METER) w/Device KIT Check blood sugar 1 time daily- E11.2 04/02/20   Unk Pinto, MD  Cholecalciferol (VITAMIN D-3) 5000 UNITS TABS Take 1 tablet by mouth daily.    [provider]  Dulaglutide (TRULICITY) 1.82 XH/3.7JI SOPN Inject 0.75 mg into the skin once a week. 12/18/20   Liane Comber, NP  gabapentin (NEURONTIN) 100 MG capsule TAKE 1 CAPSULE THREE TIMES DAILY FOR NEUROPATHY PAINS IN HANDS 11/10/20   Garnet Sierras, NP  glimepiride (AMARYL) 4 MG tablet TAKE 1 TABLET TWICE DAILY WITH MEALS FOR DIABETES 02/15/21   Magda Bernheim, NP  glucose blood (TRUE METRIX BLOOD  GLUCOSE TEST) test strip CHECK BLOOD SUGAR 1 TIME DAILY 01/25/21   Liane Comber, NP  hydrochlorothiazide (HYDRODIURIL) 25 MG tablet TAKE 1 TABLET DAILY FOR BLOOD PRESSURE AND FLUID 03/25/21   Magda Bernheim, NP  losartan (COZAAR) 100 MG tablet TAKE 1 TABLET EVERY DAY FOR BLOOD PRESSURE AND DIABETIC KIDNEY PROTECTION 08/13/20   Liane Comber, NP  Magnesium-Zinc (MAGNESIUM-CHELATED ZINC PO) Take 400 mg by mouth daily. Takes 1200 mg daily.    [provider]  metFORMIN (GLUCOPHAGE-XR) 500 MG 24 hr tablet TAKE 2 TABLETS TWICE DAILY WITH MEALS FOR DIABETES 01/11/21   Liane Comber, NP  Multiple Vitamins-Minerals (THERAGRAN-M PREMIER 50 PLUS PO) Take 1 tablet by mouth daily.    [provider]  Omega-3 Fatty Acids (FISH OIL) 500 MG CAPS Take by mouth daily.    [provider]  phentermine (ADIPEX-P) 37.5 MG tablet Take    1 tablet    every morning    for Dieting & Weight Loss 09/01/20   Unk Pinto, MD  tadalafil (CIALIS) 20 MG tablet TAKE ONE-HALF TO ONE TABLET BY MOUTH EVERY 2 TO 3 DAYS AS NEEDED 10/27/20   Liane Comber, NP  TRUEplus Lancets 30G MISC CHECK BLOOD SUGAR 1 TIME DAILY 01/25/21   Liane Comber, NP    Allergies    Crestor [rosuvastatin calcium]  Review of Systems   Review of Systems  Constitutional:  Negative for chills and fever.  Musculoskeletal:  Positive for back pain and myalgias.  Neurological:  Negative for weakness and numbness.   Physical Exam Updated Vital Signs BP (!) 151/95 (BP Location: Left Arm)   Pulse 100   Temp 98.7 F (37.1 C) (Oral)   Resp 18   SpO2 98%   Physical Exam Vitals and nursing note reviewed.  Constitutional:      General: He is not in acute distress.    Appearance: He is well-developed. He is not diaphoretic.  HENT:     Head: Normocephalic and atraumatic.  Eyes:     General: No scleral icterus.    Conjunctiva/sclera: Conjunctivae normal.  Cardiovascular:     Rate and Rhythm: Normal rate and regular  rhythm.     Heart sounds: Normal heart sounds.  Pulmonary:     Effort: Pulmonary effort is normal. No respiratory distress.     Breath sounds: Normal breath sounds.  Musculoskeletal:     Cervical back: Normal range of motion.       Back:     Comments: Tenderness palpation of the lumbar spine in the midline and paraspinal musculature as noted in the image. No step-off palpated. No visible bruising, edema or temperature change noted. No objective signs of numbness  present. No saddle anesthesia. 2+ DP pulses bilaterally. Sensation intact to light touch. Strength 5/5 in bilateral lower extremities.   Skin:    Findings: No rash.  Neurological:     Mental Status: He is alert.    ED Results / Procedures / Treatments   Labs (all labs ordered are listed, but only abnormal results are displayed) Labs Reviewed - No data to display  EKG None  Radiology DG Lumbar Spine Complete  Result Date: 03/26/2021 CLINICAL DATA:  New back pain for 2 days EXAM: LUMBAR SPINE - COMPLETE 4+ VIEW COMPARISON:  Lumbar spine radiographs 07/02/2007 FINDINGS: There are 4 non-rib-bearing lumbar type vertebral bodies. Vertebral body heights are preserved. There is grade 1 anterolisthesis of L3 on L4, new since 2008. Alignment is otherwise normal. There is disc space narrowing and degenerative endplate change at C1-E7. The remaining disc spaces are preserved. The SI joints are normal. The soft tissues are unremarkable. IMPRESSION: No acute fracture of the lumbar spine. If there is persisting clinical concern, cross-sectional imaging may be obtained. Grade 1 anterolisthesis of L3 on L4, progressed since 2008. Electronically Signed   By: Valetta Mole MD   On: 03/26/2021 11:56    Procedures Procedures   Medications Ordered in ED Medications  acetaminophen (TYLENOL) tablet 1,000 mg (1,000 mg Oral Given 03/26/21 1219)    ED Course  I have reviewed the triage vital signs and the nursing notes.  Pertinent labs & imaging  results that were available during my care of the patient were reviewed by me and considered in my medical decision making (see chart for details).    MDM Rules/Calculators/A&P                           Patient denies any concerning symptoms suggestive of cauda equina requiring urgent imaging at this time such as loss of sensation in the lower extremities, lower extremity weakness, loss of bowel or bladder control, saddle anesthesia, urinary retention, fever/chills, IVDU. Exam demonstrated no  weakness on exam today. No preceding injury or trauma to suggest acute fracture.  His symptoms began 2 days ago when he was laying on the ground while changing a spare tire.  Symptoms are worse with movement and palpation.  Musculoskeletal tenderness on exam today.  Due to his age and new back pain imaging was ordered.  This shows no acute findings, there is progression of his grade 1 anterolisthesis on L3 and L4.  Suspect the symptoms are musculoskeletal in nature. Doubt AAA as cause of patient's back pain as patient lacks major risk factors, had no abdominal TTP, and has symmetric and intact distal pulses.  We will treat symptomatically with muscle relaxer and Voltaren gel.  Patient given strict return precautions for any symptoms indicating worsening neurologic function in the lower extremities.   Patient is hemodynamically stable, in NAD, and able to ambulate in the ED. Evaluation does not show pathology that would require ongoing emergent intervention or inpatient treatment. I explained the diagnosis to the patient. Pain has been managed and has no complaints prior to discharge. Patient is comfortable with above plan and is stable for discharge at this time. All questions were answered prior to disposition. Strict return precautions for returning to the ED were discussed. Encouraged follow up with PCP.   An After Visit Summary was printed and given to the patient.   Portions of this note were generated with  [employer]. Dictation errors may occur  despite best attempts at proofreading.   Final Clinical Impression(s) / ED Diagnoses Final diagnoses:  Strain of lumbar region, initial encounter    Rx / DC Orders ED Discharge Orders          Ordered    diclofenac Sodium (VOLTAREN) 1 % GEL  4 times daily        03/26/21 1228    methocarbamol (ROBAXIN) 500 MG tablet  2 times daily        03/26/21 1228             Delia Heady, PA-C 03/26/21 1228    Milton Ferguson, MD 03/31/21 [phone]

## 2021-04-15 ENCOUNTER — Telehealth: Payer: Self-pay

## 2021-04-15 ENCOUNTER — Other Ambulatory Visit: Payer: Self-pay | Admitting: Internal Medicine

## 2021-04-15 NOTE — Telephone Encounter (Signed)
Patient was updated on labs and recommendations

## 2021-05-18 ENCOUNTER — Other Ambulatory Visit: Payer: Self-pay | Admitting: Internal Medicine

## 2021-05-18 MED ORDER — PHENTERMINE HCL 37.5 MG PO TABS: ORAL_TABLET | ORAL | 1 refills | Status: DC

## 2021-05-30 NOTE — Progress Notes (Deleted)
MEDICARE ANNUAL WELLNESS VISIT AND FOLLOW UP   Assessment:   [name] [name] was seen today for follow-up and medicare wellness.  Diagnoses and all orders for this visit:  Encounter for Medicare annual wellness exam Yearly  Essential hypertension Continue medication: Cozaar 139m, HCTZ 386mdaily Monitor blood pressure at home; call if consistently over 130/80 Continue DASH diet.   Reminder to go to the ER if any CP, SOB, nausea, dizziness, severe HA, changes vision/speech, left arm numbness and tingling and jaw pain. -     CBC with Differential/Platelet -     COMPLETE METABOLIC PANEL WITH GFR  Hyperlipidemia, mixed Trial of pravachol 1024mwo days a week, adverse side effects myalgias.  Has not been taking for three weeks. -     Lipid panel Discussed alternatives for patient Zetia, nexlatol?  Type 2 diabetes mellitus with stage 2 chronic kidney disease, without long-term current use of insulin (HCC) Taking Metformin 500m57mo tablets twice a day Not checking blood glucose, discussed this at length with patien -     Hemoglobin A1c Discussed dietary and exercise modifications  Vitamin D deficiency Continue supplementation 5,000IU daily -Vitamin D (Defciency)  Gastroesophageal reflux disease, unspecified whether esophagitis present Doing well Monitoring diet and avoiding triggers Increase water intake  Morbid obesity with BMI of 40.0-44.9, adult (HCC) Discussed dietary and exercise modifications at length with patient.  CKD stage 2 due to type 2 diabetes mellitus (HCC) Continue glucose monitoring Continue medications Increase fluids Avoid NSAIDS Discussed dietary modifications and exercise Will continue to monitor  Erectile dysfunction associated with type 2 diabetes mellitus (HCC) -     sildenafil (VIAGRA) 100 MG tablet; Take 1 tablet (100 mg total) by mouth as needed for erectile dysfunction. Discussed PRN use, was taking daily without desired effect Discussed medication  and side effects  Aneurysm artery, iliac common (HCC) Control blood pressure, cholesterol, glucose, increase exercise.   Medication management Continued     Further disposition pending results if labs check today. Discussed med's effects and SE's.   Over 40 minutes of face to face interview, exam, counseling, chart review, and critical decision making was performed.    Future Appointments  Date Time Provider [place]/24/2022 11:30 AM MullMagda Bernheim [facility] None  09/24/2021 11:00 AM McKeUnk Pinto [facility] None     Plan:   During the course of the visit the patient was educated and counseled about appropriate screening and preventive services including:   Pneumococcal vaccine  Influenza vaccine Prevnar 13 Td vaccine Screening electrocardiogram Colorectal cancer screening Diabetes screening Glaucoma screening Nutrition counseling    Subjective:  [name] SMELTZa 65 y26. AAM who presents for Medicare Annual Wellness Visit and 3 month follow up for HTN, HLD, DMII with CKD, and vitamin D Def. He also has obstructive sleep apnea and on CPAP.  He is on disability for bilateral carpel tunnel syndrome. He is taking care of his father, moved him in with him. He has retired from [employer] is prescribed phentermine for weight loss but has restarted this, half a tablet daily in morning. He denies palpitations, anxiety, trouble sleeping, elevated BP. He does recall benefit when taking previously.  BMI is There is no height or weight on file to calculate BMI., he is currently working on diet and exercise, would like to get down to 185 lb.  Wt Readings from Last 3 Encounters:  03/21/21 288 lb (130.6 kg)  12/17/20 293 lb (132.9 kg)  09/26/20  290 lb (131.5 kg)   His HTN predates 2007.  He has not recently been checking BPs at home, today their BP is   He does workout, cutting grass, riding bike until recent knee pains.  He denies chest pain, shortness of  breath, dizziness.    He is not on cholesterol medication (rosuvastatin but had myalgias, tolerated simvastatin 40 mg daily previously) and having myalgias.  He was taking thipravastatin two days a week and started having muscle spasms in his legs and back from this.  He has stopped the medication.  He continues to take  Fish oil daily. His cholesterol is not at goal. The cholesterol last visit was:   Lab Results  Component Value Date   CHOL 213 (H) 03/21/2021   HDL 48 03/21/2021   LDLCALC 146 (H) 03/21/2021   TRIG 88 03/21/2021   CHOLHDL 4.4 03/21/2021   He has been working on diet and exercise for Diabetes controlled by metformin with diabetic chronic kidney disease and with diabetic polyneuropathy, he is on gabapentin which is helping, he is on bASA, he is on ACE/ARB, and denies polydipsia, polyuria and visual disturbances. He has not been check fasting glucose. Last A1C was:  Lab Results  Component Value Date   HGBA1C 6.3 (H) 03/21/2021   Last GFR Lab Results  Component Value Date   GFRAA 79 12/17/2020   Patient is on Vitamin D supplement.  Last visit he was at goal. Lab Results  Component Value Date   VD25OH 87 03/21/2021        Medication Review: Current Outpatient Medications on File Prior to Visit  Medication Sig Dispense Refill   Alcohol Swabs (DROPSAFE ALCOHOL PREP) 70 % PADS USE TO CHECK BLOOD SUGAR 1 TIME DAILY 100 each 3   Ascorbic Acid (VITA-C PO) Take by mouth.     aspirin EC 81 MG tablet Take 81 mg by mouth daily.     Blood Glucose Calibration (TRUE METRIX LEVEL 1) Low SOLN Use with glucometer. Check blood sugar 1 time daily-E11.2. 1 each 3   Blood Glucose Monitoring Suppl (TRUE METRIX METER) w/Device KIT Check blood sugar 1 time daily- E11.2 1 kit 0   Cholecalciferol (VITAMIN D-3) 5000 UNITS TABS Take 1 tablet by mouth daily.     diclofenac Sodium (VOLTAREN) 1 % GEL Apply 2 g topically 4 (four) times daily. 50 g 0   Dulaglutide (TRULICITY) 3.22 GU/5.4YH SOPN  Inject 0.75 mg into the skin once a week. 6 mL 0   gabapentin (NEURONTIN) 100 MG capsule TAKE 1 CAPSULE THREE TIMES DAILY FOR NEUROPATHY PAINS IN HANDS 270 capsule 3   glimepiride (AMARYL) 4 MG tablet TAKE 1 TABLET TWICE DAILY WITH MEALS FOR DIABETES 180 tablet 0   glucose blood (TRUE METRIX BLOOD GLUCOSE TEST) test strip CHECK BLOOD SUGAR 1 TIME DAILY 100 strip 3   hydrochlorothiazide (HYDRODIURIL) 25 MG tablet TAKE 1 TABLET DAILY FOR BLOOD PRESSURE AND FLUID 90 tablet 3   losartan (COZAAR) 100 MG tablet TAKE 1 TABLET EVERY DAY FOR BLOOD PRESSURE AND DIABETIC KIDNEY PROTECTION 90 tablet 3   Magnesium-Zinc (MAGNESIUM-CHELATED ZINC PO) Take 400 mg by mouth daily. Takes 1200 mg daily.     metFORMIN (GLUCOPHAGE-XR) 500 MG 24 hr tablet TAKE 2 TABLETS TWICE DAILY WITH MEALS FOR DIABETES 360 tablet 3   methocarbamol (ROBAXIN) 500 MG tablet Take 1 tablet (500 mg total) by mouth 2 (two) times daily. 10 tablet 0   Multiple Vitamins-Minerals (THERAGRAN-M PREMIER 50 PLUS PO)  Take 1 tablet by mouth daily.     Omega-3 Fatty Acids (FISH OIL) 500 MG CAPS Take by mouth daily.     phentermine (ADIPEX-P) 37.5 MG tablet Take  1 tablet  every Morning  for Dieting & Weight Loss 90 tablet 1   tadalafil (CIALIS) 20 MG tablet TAKE ONE-HALF TO ONE TABLET BY MOUTH EVERY 2 TO 3 DAYS AS NEEDED 30 tablet 2   TRUEplus Lancets 30G MISC CHECK BLOOD SUGAR 1 TIME DAILY 100 each 3   No current facility-administered medications on file prior to visit.    Allergies: Allergies  Allergen Reactions   Crestor [Rosuvastatin Calcium] Other (See Comments)    Cramping    Current Problems (verified) has HTN (hypertension); Hyperlipidemia associated with type 2 diabetes mellitus ([place]); GERD (gastroesophageal reflux disease); Morbid obesity (BMI 40+); Vitamin D deficiency; T2_NIDDM w/ Stage 2 CKD ([place]); Medication management; Aneurysm artery, iliac common ([place]); Chronic pain syndrome; Peripheral sensory neuropathy due to T2_NIDDM;  Erectile dysfunction associated with type 2 diabetes mellitus ([place]); Chronic lower back pain; Gluttony; Arthritis of right knee; and CKD stage 2 due to type 2 diabetes mellitus ([place]) on their problem list.  Screening Tests Immunization History  Administered Date(s) Administered   Pneumococcal-Unspecified 02/16/2013   Tdap 02/10/2011   Preventative care: Last colonoscopy: 2013, due 2023 Ct head 11/2014 US aorta 2015   Prior vaccinations: TD or Tdap: 01/2011, Due 2022  Influenza: Declined  Pneumococcal: 02/2013 Prevnar13: Declined until 65 Shingles/Zostavax: Declined SARS-COV-Moderna Complete: 04/2020   Names of Other Physician/Practitioners you currently use: 1. [facility] here for primary care 2. Dr. Herbert Deaner, eye doctor, last visit 05/2019 - scheduled 3. Does not have a dentist- reminded to schedule  Patient Care Team: Unk Pinto, MD as PCP - General (Internal Medicine) Sable Feil, MD as Consulting Physician (Gastroenterology) Monna Fam, MD as Consulting Physician (Ophthalmology) Elsie Saas, MD as Consulting Physician (Orthopedic Surgery) Leeroy Cha, MD as Consulting Physician (Neurosurgery) Gean Birchwood, DPM as Consulting Physician (Podiatry)  Surgical: He  has a past surgical history that includes Carpal tunnel release (2008); Knee arthroscopy (2010); and Cervical discectomy (2003). Family His family history includes Diabetes in his father, mother, and sister; Heart disease in his mother; Hypertension in his father; Kidney disease in his sister; Stroke in his mother. Social history  He reports that he quit smoking about 30 years ago. His smoking use included cigarettes. He has never used smokeless tobacco. He reports that he does not currently use alcohol. He reports that he does not use drugs.  MEDICARE WELLNESS OBJECTIVES: Physical activity:   Cardiac risk factors:   Depression/mood screen:    Depression screen [facility] 2/9 03/23/2021  Decreased Interest 0  Down, Depressed, Hopeless 0  PHQ - 2 Score 0    ADLs:  In your present state of health, do you have any difficulty performing the following activities: 03/23/2021 09/15/2020  Hearing? N N  Vision? N N  Difficulty concentrating or making decisions? N N  Walking or climbing stairs? N N  Dressing or bathing? N N  Doing errands, shopping? N N  Preparing Food and eating ? - -  Using the Toilet? - -  In the past six months, have you accidently leaked urine? - -  Do you have problems with loss of bowel control? - -  Managing your Medications? - -  Managing your Finances? - -  Housekeeping or managing your Housekeeping? - -  Some recent data might  be hidden     Cognitive Testing  Alert? Yes  Normal Appearance?Yes  Oriented to person? Yes  Place? Yes   Time? Yes  Recall of three objects?  Yes  Can perform simple calculations? Yes  Displays appropriate judgment?Yes  Can read the correct time from a watch face?Yes  EOL planning:     Do you have any new medications? Current Outpatient Medications on File Prior to Visit  Medication Sig   Alcohol Swabs (DROPSAFE ALCOHOL PREP) 70 % PADS USE TO CHECK BLOOD SUGAR 1 TIME DAILY   Ascorbic Acid (VITA-C PO) Take by mouth.   aspirin EC 81 MG tablet Take 81 mg by mouth daily.   Blood Glucose Calibration (TRUE METRIX LEVEL 1) Low SOLN Use with glucometer. Check blood sugar 1 time daily-E11.2.   Blood Glucose Monitoring Suppl (TRUE METRIX METER) w/Device KIT Check blood sugar 1 time daily- E11.2   Cholecalciferol (VITAMIN D-3) 5000 UNITS TABS Take 1 tablet by mouth daily.   diclofenac Sodium (VOLTAREN) 1 % GEL Apply 2 g topically 4 (four) times daily.   Dulaglutide (TRULICITY) 4.92 EF/0.0FH SOPN Inject 0.75 mg into the skin once a week.   gabapentin (NEURONTIN) 100 MG capsule TAKE 1 CAPSULE THREE TIMES DAILY FOR NEUROPATHY PAINS IN HANDS   glimepiride (AMARYL) 4 MG tablet TAKE 1 TABLET  TWICE DAILY WITH MEALS FOR DIABETES   glucose blood (TRUE METRIX BLOOD GLUCOSE TEST) test strip CHECK BLOOD SUGAR 1 TIME DAILY   hydrochlorothiazide (HYDRODIURIL) 25 MG tablet TAKE 1 TABLET DAILY FOR BLOOD PRESSURE AND FLUID   losartan (COZAAR) 100 MG tablet TAKE 1 TABLET EVERY DAY FOR BLOOD PRESSURE AND DIABETIC KIDNEY PROTECTION   Magnesium-Zinc (MAGNESIUM-CHELATED ZINC PO) Take 400 mg by mouth daily. Takes 1200 mg daily.   metFORMIN (GLUCOPHAGE-XR) 500 MG 24 hr tablet TAKE 2 TABLETS TWICE DAILY WITH MEALS FOR DIABETES   methocarbamol (ROBAXIN) 500 MG tablet Take 1 tablet (500 mg total) by mouth 2 (two) times daily.   Multiple Vitamins-Minerals (THERAGRAN-M PREMIER 50 PLUS PO) Take 1 tablet by mouth daily.   Omega-3 Fatty Acids (FISH OIL) 500 MG CAPS Take by mouth daily.   phentermine (ADIPEX-P) 37.5 MG tablet Take  1 tablet  every Morning  for Dieting & Weight Loss   tadalafil (CIALIS) 20 MG tablet TAKE ONE-HALF TO ONE TABLET BY MOUTH EVERY 2 TO 3 DAYS AS NEEDED   TRUEplus Lancets 30G MISC CHECK BLOOD SUGAR 1 TIME DAILY   No current facility-administered medications on file prior to visit.     Objective:   There were no vitals filed for this visit.  There is no height or weight on file to calculate BMI.  General Appearance: Morbidly obese, in no apparent distress. Eyes: PERRLA, EOMs, conjunctiva no swelling or erythema Sinuses: No Frontal/maxillary tenderness ENT/Mouth: Ext aud canals clear, TMs without erythema, bulging. No erythema, swelling, or exudate on post pharynx.  Tonsils not swollen or erythematous. Hearing normal.  Neck: Supple, thyroid normal.  Respiratory: Respiratory effort normal, BS equal bilaterally without rales, rhonchi, wheezing or stridor.  Cardio: RRR with no MRGs. Diminished peripheral pulses without edema.  Abdomen: Soft, obese, + BS.  Non tender, no guarding, rebound, hernias, masses. Lymphatics: Non tender without lymphadenopathy.  Musculoskeletal: Full  ROM except right knee,wearing brace.  Normal gait Skin: Warm, dry without rashes, lesions, ecchymosis.  Neuro: Cranial nerves intact. No cerebellar symptoms.  Psych: Awake and oriented X 3, normal affect, Insight and Judgment appropriate.   Medicare Attestation  I have personally reviewed:  The patient's medical and social history Their use of alcohol, tobacco or illicit drugs Their current medications and supplements The patient's functional ability including ADLs,fall risks, home safety risks, cognitive, and hearing and visual impairment Diet and physical activities Evidence for depression or mood disorders  The patient's weight, height, BMI, and visual acuity have been recorded in the chart.  I have made referrals, counseling, and provided education to the patient based on review of the above and I have provided the patient with a written personalized care plan for preventive services.     Magda Bernheim ANP-C  [facility]  05/30/2021

## 2021-06-10 ENCOUNTER — Ambulatory Visit: Payer: Medicare HMO | Admitting: Adult Health Nurse Practitioner

## 2021-06-10 ENCOUNTER — Ambulatory Visit: Payer: Medicare HMO | Admitting: Nurse Practitioner

## 2021-06-10 DIAGNOSIS — I723 Aneurysm of iliac artery: Secondary | ICD-10-CM

## 2021-06-10 DIAGNOSIS — Z79899 Other long term (current) drug therapy: Secondary | ICD-10-CM

## 2021-06-10 DIAGNOSIS — E1169 Type 2 diabetes mellitus with other specified complication: Secondary | ICD-10-CM

## 2021-06-10 DIAGNOSIS — E1122 Type 2 diabetes mellitus with diabetic chronic kidney disease: Secondary | ICD-10-CM

## 2021-06-10 DIAGNOSIS — K2100 Gastro-esophageal reflux disease with esophagitis, without bleeding: Secondary | ICD-10-CM

## 2021-06-10 DIAGNOSIS — E559 Vitamin D deficiency, unspecified: Secondary | ICD-10-CM

## 2021-06-10 DIAGNOSIS — I10 Essential (primary) hypertension: Secondary | ICD-10-CM

## 2021-06-10 NOTE — Progress Notes (Signed)
MEDICARE ANNUAL WELLNESS VISIT AND FOLLOW UP   Assessment:   Cordarryl was seen today for follow-up and medicare wellness.  Diagnoses and all orders for this visit:  Encounter for Medicare annual wellness exam Yearly  Essential hypertension Continue medication: Cozaar 124m, HCTZ 337mdaily Monitor blood pressure at home; call if consistently over 130/80 Continue DASH diet.   Reminder to go to the ER if any CP, SOB, nausea, dizziness, severe HA, changes vision/speech, left arm numbness and tingling and jaw pain.   Hyperlipidemia, mixed/Statin Myopathy       Discussed alternatives for patient Zetia  Type 2 diabetes mellitus with stage 2 chronic kidney disease, without long-term current use of insulin (HCC) Taking Metformin 50068mwo tablets twice a day Not checking blood glucose, discussed this at length with patien       Discussed dietary and exercise modifications  Vitamin D deficiency Continue supplementation 5,000IU daily  Gastroesophageal reflux disease, unspecified whether esophagitis present Doing well Monitoring diet and avoiding triggers Increase water intake  Laceration of hand Td vaccine given Keep clean and dry  Need for vaccination against Streptococcus pneumoniae Pneumococcal conjugate vaccine- 20 valent given  Morbid obesity with BMI of 40.0-44.9, adult (HCCLe Royiscussed dietary and exercise modifications at length with patient.  CKD stage 2 due to type 2 diabetes mellitus (HCC) Continue glucose monitoring Continue medications Increase fluids Avoid NSAIDS Discussed dietary modifications and exercise Will continue to monitor  Erectile dysfunction associated with type 2 diabetes mellitus (HCC) Continue Cialis 20 mg 1/2-1 tab every 2-3 days as needed Discussed PRN use, was taking daily without desired effect Discussed medication and side effects  Aneurysm artery, iliac common (HCC) Control blood pressure, cholesterol, glucose, increase exercise.    Medication management Continued     Further disposition pending results if labs check today. Discussed med's effects and SE's.   Over 30 minutes of face to face interview, exam, counseling, chart review, and critical decision making was performed.    Future Appointments  Date Time Provider [place]/02/2022 11:00 AM McKUnk PintoD [facility] None     Plan:   During the course of the visit the patient was educated and counseled about appropriate screening and preventive services including:   Pneumococcal vaccine  Influenza vaccine Prevnar 13 Td vaccine Screening electrocardiogram Colorectal cancer screening Diabetes screening Glaucoma screening Nutrition counseling    Subjective:  [name] [name] a [age]o. AAM who presents for Medicare Annual Wellness Visit and 3 month follow up for HTN, HLD, DMII with CKD, and vitamin D Def. He also has obstructive sleep apnea and on CPAP.  He is on disability for bilateral carpel tunnel syndrome. He has retired from [employer] is prescribed phentermine for weight loss he is going to get prescription at pharmacy to restart.  He denies palpitations, anxiety, trouble sleeping, elevated BP. He does recall benefit when taking previously.  BMI is Body mass index is 44.32 kg/m., he is currently working on diet and exercise, would like to get down to 185 lb. He is going to restart phentermine.   Wt Readings from Last 3 Encounters:  06/11/21 295 lb 12.8 oz (134.2 kg)  03/21/21 288 lb (130.6 kg)  12/17/20 293 lb (132.9 kg)   His HTN predates 2007.  He has not recently been checking BPs at home, today their BP is BP: 124/72 He does workout, cutting grass and is [facility] [place] denies chest pain, shortness of breath, dizziness.  He is not on cholesterol medication (rosuvastatin but had myalgias, tolerated simvastatin 40 mg daily and pravachol all caused myalgias).  He continues to take Fish oil daily. His  cholesterol is not at goal. The cholesterol last visit was:   Lab Results  Component Value Date   CHOL 213 (H) 03/21/2021   HDL 48 03/21/2021   LDLCALC 146 (H) 03/21/2021   TRIG 88 03/21/2021   CHOLHDL 4.4 03/21/2021   He has been working on diet and exercise for Diabetes controlled by metformin with diabetic chronic kidney disease and with diabetic polyneuropathy, he is on gabapentin which is helping, he is on bASA, he is on ACE/ARB, and denies polydipsia, polyuria and visual disturbances. He has not been check fasting glucose. Blood sugars have been running in the 80's.Last A1C was:  Lab Results  Component Value Date   HGBA1C 6.3 (H) 03/21/2021   Last GFR Lab Results  Component Value Date   GFRAA 79 12/17/2020   Patient is on Vitamin D supplement.  Last visit he was at goal. Lab Results  Component Value Date   VD25OH 87 03/21/2021      Pt cut is hand  Medication Review: Current Outpatient Medications on File Prior to Visit  Medication Sig Dispense Refill   Alcohol Swabs (DROPSAFE ALCOHOL PREP) 70 % PADS USE TO CHECK BLOOD SUGAR 1 TIME DAILY 100 each 3   Ascorbic Acid (VITA-C PO) Take by mouth. 3 times a week     aspirin EC 81 MG tablet Take 81 mg by mouth daily.     Blood Glucose Calibration (TRUE METRIX LEVEL 1) Low SOLN Use with glucometer. Check blood sugar 1 time daily-E11.2. 1 each 3   Blood Glucose Monitoring Suppl (TRUE METRIX METER) w/Device KIT Check blood sugar 1 time daily- E11.2 1 kit 0   Cholecalciferol (VITAMIN D-3) 5000 UNITS TABS Take 1 tablet by mouth daily.     Dulaglutide (TRULICITY) 1.44 YJ/8.5UD SOPN Inject 0.75 mg into the skin once a week. 6 mL 0   ferrous sulfate 325 (65 FE) MG EC tablet Take 325 mg by mouth 3 (three) times daily with meals. Takes Mondays, Wednesdays, Fidays     gabapentin (NEURONTIN) 100 MG capsule TAKE 1 CAPSULE THREE TIMES DAILY FOR NEUROPATHY PAINS IN HANDS 270 capsule 3   glimepiride (AMARYL) 4 MG tablet TAKE 1 TABLET TWICE DAILY  WITH MEALS FOR DIABETES 180 tablet 0   glucose blood (TRUE METRIX BLOOD GLUCOSE TEST) test strip CHECK BLOOD SUGAR 1 TIME DAILY 100 strip 3   hydrochlorothiazide (HYDRODIURIL) 25 MG tablet TAKE 1 TABLET DAILY FOR BLOOD PRESSURE AND FLUID 90 tablet 3   losartan (COZAAR) 100 MG tablet TAKE 1 TABLET EVERY DAY FOR BLOOD PRESSURE AND DIABETIC KIDNEY PROTECTION 90 tablet 3   Magnesium-Zinc (MAGNESIUM-CHELATED ZINC PO) Take 400 mg by mouth daily. Takes 1200 mg daily.     metFORMIN (GLUCOPHAGE-XR) 500 MG 24 hr tablet TAKE 2 TABLETS TWICE DAILY WITH MEALS FOR DIABETES 360 tablet 3   Multiple Vitamins-Minerals (THERAGRAN-M PREMIER 50 PLUS PO) Take 1 tablet by mouth daily.     Omega-3 Fatty Acids (FISH OIL) 500 MG CAPS Take by mouth daily.     phentermine (ADIPEX-P) 37.5 MG tablet Take  1 tablet  every Morning  for Dieting & Weight Loss 90 tablet 1   tadalafil (CIALIS) 20 MG tablet TAKE ONE-HALF TO ONE TABLET BY MOUTH EVERY 2 TO 3 DAYS AS NEEDED 30 tablet 2   TRUEplus Lancets 30G  MISC CHECK BLOOD SUGAR 1 TIME DAILY 100 each 3   No current facility-administered medications on file prior to visit.    Allergies: Allergies  Allergen Reactions   Crestor [Rosuvastatin Calcium] Other (See Comments)    Cramping    Current Problems (verified) has HTN (hypertension); Hyperlipidemia associated with type 2 diabetes mellitus ([place]); GERD (gastroesophageal reflux disease); Morbid obesity (BMI 40+); Vitamin D deficiency; T2_NIDDM w/ Stage 2 CKD ([place]); Medication management; Aneurysm artery, iliac common ([place]); Chronic pain syndrome; Peripheral sensory neuropathy due to T2_NIDDM; Erectile dysfunction associated with type 2 diabetes mellitus ([place]); Chronic lower back pain; Gluttony; Arthritis of right knee; and CKD stage 2 due to type 2 diabetes mellitus ([place]) on their problem list.  Screening Tests Immunization History  Administered Date(s) Administered   Pneumococcal-Unspecified 02/16/2013   Tdap 02/10/2011    Preventative care: Last colonoscopy: 2013, due 2023 Ct head 11/2014 US aorta 2015   Prior vaccinations: TD or Tdap: 06/11/21  Influenza: Declined  Pneumococcal: 06/11/21 Prevnar13: Declined until 65 Shingles/Zostavax: Declined SARS-COV-Moderna Complete: 04/2020   Names of Other Physician/Practitioners you currently use: 1. [facility] here for primary care 2. Dr. Sherral Hammers, eye doctor, last visit 12/05/20 3. Does not have a dentist- reminded to schedule  Patient Care Team: Unk Pinto, MD as PCP - General (Internal Medicine) Sable Feil, MD as Consulting Physician (Gastroenterology) Monna Fam, MD as Consulting Physician (Ophthalmology) Elsie Saas, MD as Consulting Physician (Orthopedic Surgery) Leeroy Cha, MD as Consulting Physician (Neurosurgery) Gean Birchwood, DPM as Consulting Physician (Podiatry)  Surgical: He  has a past surgical history that includes Carpal tunnel release (2008); Knee arthroscopy (2010); and Cervical discectomy (2003). Family His family history includes Diabetes in his father, mother, and sister; Heart disease in his mother; Hypertension in his father; Kidney disease in his sister; Stroke in his mother. Social history  He reports that he quit smoking about 30 years ago. His smoking use included cigarettes. He has never used smokeless tobacco. He reports that he does not currently use alcohol. He reports that he does not use drugs.  MEDICARE WELLNESS OBJECTIVES: Physical activity: Current Exercise Habits: Structured exercise class, Type of exercise: walking, Time (Minutes): 30, Frequency (Times/Week): 3, Weekly Exercise (Minutes/Week): 90, Intensity: Mild, Exercise limited by: None identified Cardiac risk factors: Cardiac Risk Factors include: advanced age (>56mn, >>65women);diabetes mellitus;dyslipidemia;hypertension;male gender;obesity (BMI >30kg/m2);sedentary lifestyle Depression/mood  screen:   [facility]11[facility]  Decreased Interest 0  Down, Depressed, Hopeless 0  PHQ - 2 Score 0    ADLs:  In your present state of health, do you have any difficulty performing the following activities: 06/11/2021 03/23/2021  Hearing? N N  Vision? N N  Difficulty concentrating or making decisions? N N  Walking or climbing stairs? N N  Dressing or bathing? N N  Doing errands, shopping? N N  Some recent data might be hidden     Cognitive Testing  Alert? Yes  Normal Appearance?Yes  Oriented to person? Yes  Place? Yes   Time? Yes  Recall of three objects?  Yes  Can perform simple calculations? Yes  Displays appropriate judgment?Yes  Can read the correct time from a watch face?Yes  EOL planning: Does Patient Have a Medical Advance Directive?: No Would patient like information on creating a medical advance directive?: Yes (MAU/Ambulatory/Procedural Areas - Information given)   Do you have any new medications? Current Outpatient Medications on File Prior to Visit  Medication Sig   Alcohol  Swabs (DROPSAFE ALCOHOL PREP) 70 % PADS USE TO CHECK BLOOD SUGAR 1 TIME DAILY   Ascorbic Acid (VITA-C PO) Take by mouth. 3 times a week   aspirin EC 81 MG tablet Take 81 mg by mouth daily.   Blood Glucose Calibration (TRUE METRIX LEVEL 1) Low SOLN Use with glucometer. Check blood sugar 1 time daily-E11.2.   Blood Glucose Monitoring Suppl (TRUE METRIX METER) w/Device KIT Check blood sugar 1 time daily- E11.2   Cholecalciferol (VITAMIN D-3) 5000 UNITS TABS Take 1 tablet by mouth daily.   Dulaglutide (TRULICITY) 0.96 GE/3.6OQ SOPN Inject 0.75 mg into the skin once a week.   ferrous sulfate 325 (65 FE) MG EC tablet Take 325 mg by mouth 3 (three) times daily with meals. Takes Mondays, Wednesdays, Fidays   gabapentin (NEURONTIN) 100 MG capsule TAKE 1 CAPSULE THREE TIMES DAILY FOR NEUROPATHY PAINS IN HANDS   glimepiride (AMARYL) 4 MG tablet TAKE 1 TABLET TWICE DAILY WITH MEALS FOR  DIABETES   glucose blood (TRUE METRIX BLOOD GLUCOSE TEST) test strip CHECK BLOOD SUGAR 1 TIME DAILY   hydrochlorothiazide (HYDRODIURIL) 25 MG tablet TAKE 1 TABLET DAILY FOR BLOOD PRESSURE AND FLUID   losartan (COZAAR) 100 MG tablet TAKE 1 TABLET EVERY DAY FOR BLOOD PRESSURE AND DIABETIC KIDNEY PROTECTION   Magnesium-Zinc (MAGNESIUM-CHELATED ZINC PO) Take 400 mg by mouth daily. Takes 1200 mg daily.   metFORMIN (GLUCOPHAGE-XR) 500 MG 24 hr tablet TAKE 2 TABLETS TWICE DAILY WITH MEALS FOR DIABETES   Multiple Vitamins-Minerals (THERAGRAN-M PREMIER 50 PLUS PO) Take 1 tablet by mouth daily.   Omega-3 Fatty Acids (FISH OIL) 500 MG CAPS Take by mouth daily.   phentermine (ADIPEX-P) 37.5 MG tablet Take  1 tablet  every Morning  for Dieting & Weight Loss   tadalafil (CIALIS) 20 MG tablet TAKE ONE-HALF TO ONE TABLET BY MOUTH EVERY 2 TO 3 DAYS AS NEEDED   TRUEplus Lancets 30G MISC CHECK BLOOD SUGAR 1 TIME DAILY   No current facility-administered medications on file prior to visit.     Objective:   Today's Vitals   06/11/21 1136  BP: 124/72  Pulse: 95  Temp: 97.7 F (36.5 C)  SpO2: 96%  Weight: 295 lb 12.8 oz (134.2 kg)    Body mass index is 44.32 kg/m.  General Appearance: Morbidly obese, in no apparent distress. Eyes: PERRLA, EOMs, conjunctiva no swelling or erythema Sinuses: No Frontal/maxillary tenderness ENT/Mouth: Ext aud canals clear, TMs without erythema, bulging. No erythema, swelling, or exudate on post pharynx.  Tonsils not swollen or erythematous. Hearing normal.  Neck: Supple, thyroid normal.  Respiratory: Respiratory effort normal, BS equal bilaterally without rales, rhonchi, wheezing or stridor.  Cardio: RRR with no MRGs. Diminished peripheral pulses without edema.  Abdomen: Soft, obese, + BS.  Non tender, no guarding, rebound, hernias, masses. Lymphatics: Non tender without lymphadenopathy.  Musculoskeletal: Full ROM except right knee,wearing brace.  Normal gait Skin:  Warm, dry without rashes, lesions, ecchymosis.  Neuro: Cranial nerves intact. No cerebellar symptoms.  Psych: Awake and oriented X 3, normal affect, Insight and Judgment appropriate.   Medicare Attestation I have personally reviewed:  The patient's medical and social history Their use of alcohol, tobacco or illicit drugs Their current medications and supplements The patient's functional ability including ADLs,fall risks, home safety risks, cognitive, and hearing and visual impairment Diet and physical activities Evidence for depression or mood disorders  The patient's weight, height, BMI, and visual acuity have been recorded in the chart.  I have  made referrals, counseling, and provided education to the patient based on review of the above and I have provided the patient with a written personalized care plan for preventive services.     Magda Bernheim ANP-C  [facility]  06/11/2021

## 2021-06-11 ENCOUNTER — Other Ambulatory Visit: Payer: Self-pay

## 2021-06-11 ENCOUNTER — Ambulatory Visit (INDEPENDENT_AMBULATORY_CARE_PROVIDER_SITE_OTHER): Payer: Medicare HMO | Admitting: Nurse Practitioner

## 2021-06-11 ENCOUNTER — Encounter: Payer: Self-pay | Admitting: Nurse Practitioner

## 2021-06-11 VITALS — BP 124/72 | HR 95 | Temp 97.7°F | Wt 295.8 lb

## 2021-06-11 DIAGNOSIS — T466X5A Adverse effect of antihyperlipidemic and antiarteriosclerotic drugs, initial encounter: Secondary | ICD-10-CM

## 2021-06-11 DIAGNOSIS — E1142 Type 2 diabetes mellitus with diabetic polyneuropathy: Secondary | ICD-10-CM

## 2021-06-11 DIAGNOSIS — Z0001 Encounter for general adult medical examination with abnormal findings: Secondary | ICD-10-CM | POA: Diagnosis not present

## 2021-06-11 DIAGNOSIS — E559 Vitamin D deficiency, unspecified: Secondary | ICD-10-CM

## 2021-06-11 DIAGNOSIS — N521 Erectile dysfunction due to diseases classified elsewhere: Secondary | ICD-10-CM

## 2021-06-11 DIAGNOSIS — I10 Essential (primary) hypertension: Secondary | ICD-10-CM

## 2021-06-11 DIAGNOSIS — E785 Hyperlipidemia, unspecified: Secondary | ICD-10-CM

## 2021-06-11 DIAGNOSIS — K2100 Gastro-esophageal reflux disease with esophagitis, without bleeding: Secondary | ICD-10-CM

## 2021-06-11 DIAGNOSIS — S61411A Laceration without foreign body of right hand, initial encounter: Secondary | ICD-10-CM

## 2021-06-11 DIAGNOSIS — E1122 Type 2 diabetes mellitus with diabetic chronic kidney disease: Secondary | ICD-10-CM

## 2021-06-11 DIAGNOSIS — N182 Chronic kidney disease, stage 2 (mild): Secondary | ICD-10-CM | POA: Diagnosis not present

## 2021-06-11 DIAGNOSIS — E1169 Type 2 diabetes mellitus with other specified complication: Secondary | ICD-10-CM

## 2021-06-11 DIAGNOSIS — Z6841 Body Mass Index (BMI) 40.0 and over, adult: Secondary | ICD-10-CM

## 2021-06-11 DIAGNOSIS — E1140 Type 2 diabetes mellitus with diabetic neuropathy, unspecified: Secondary | ICD-10-CM

## 2021-06-11 DIAGNOSIS — Z23 Encounter for immunization: Secondary | ICD-10-CM

## 2021-06-11 DIAGNOSIS — G720 Drug-induced myopathy: Secondary | ICD-10-CM | POA: Diagnosis not present

## 2021-06-11 DIAGNOSIS — R6889 Other general symptoms and signs: Secondary | ICD-10-CM | POA: Diagnosis not present

## 2021-06-11 DIAGNOSIS — I723 Aneurysm of iliac artery: Secondary | ICD-10-CM | POA: Diagnosis not present

## 2021-06-11 DIAGNOSIS — Z0000 Encounter for general adult medical examination without abnormal findings: Secondary | ICD-10-CM

## 2021-06-11 NOTE — Patient Instructions (Signed)

## 2021-08-01 ENCOUNTER — Other Ambulatory Visit: Payer: Self-pay | Admitting: Internal Medicine

## 2021-08-01 ENCOUNTER — Other Ambulatory Visit: Payer: Self-pay | Admitting: Nurse Practitioner

## 2021-08-01 DIAGNOSIS — E1122 Type 2 diabetes mellitus with diabetic chronic kidney disease: Secondary | ICD-10-CM

## 2021-08-01 DIAGNOSIS — I10 Essential (primary) hypertension: Secondary | ICD-10-CM

## 2021-08-01 MED ORDER — LOSARTAN POTASSIUM 100 MG PO TABS: ORAL_TABLET | ORAL | 3 refills | Status: DC

## 2021-09-23 ENCOUNTER — Encounter: Payer: Self-pay | Admitting: Internal Medicine

## 2021-09-23 NOTE — Patient Instructions (Signed)

## 2021-09-23 NOTE — Progress Notes (Signed)
Annual  Screening/Preventative Visit  & Comprehensive Evaluation & Examination  Future Appointments  Date Time Provider Department  09/24/2021              CPE 11:00 AM Unk Pinto, MD [facility]  06/11/2022          Wellness 11:30 AM Magda Bernheim, NP [facility]  09/30/2022            CPE 11:00 AM Unk Pinto, MD [facility]            This very nice 66 y.o. single BM presents for a Screening /Preventative Visit & comprehensive evaluation and management of multiple medical co-morbidities.  Patient has been followed for HTN, HLD, T2_NIDDM  and Vitamin D Deficiency. Patient has hx/o OSA , but never was able to tolerate a mask.   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  Patient's BP has been controlled at home.  Today's BP is at goal - 130/84. Patient denies any cardiac symptoms as chest pain, palpitations, shortness of breath, dizziness or ankle swelling.       Patient' has hx/o Statin intolerance  and his hyperlipidemia is NOT controlled with diet and medications. Patient denies myalgias or other medication SE's. Last lipids were not at goal :  Lab Results  Component Value Date   CHOL 213 (H) 03/21/2021   HDL 48 03/21/2021   LDLCALC 146 (H) 03/21/2021   TRIG 88 03/21/2021   CHOLHDL 4.4 03/21/2021         Patient has Morbid Obesity (BMI 42+) and T2_NIDDM  (2014) w/CKD2  (GFR 77) and is on Metformin, Glimepiride & Trulicity.  He was hospitalized in 2014 with Hemiballismus from non-ketotic hyperosmolar hyperglycemia (Glu>1000 mg%).  Patient denies reactive hypoglycemic symptoms, visual blurring, diabetic polys or paresthesias. Last A1c was not at goal :   Lab Results  Component Value Date   HGBA1C 6.3 (H) 03/21/2021          Finally, patient has history of  Vitamin D Deficiency  and last vitamin D was at goal :   Lab Results  Component Value Date   VD25OH 87 03/21/2021     Current Outpatient Medications on File Prior to Visit  Medication Sig   VITA-C  Take 3 times a week   aspirin EC 81 MG tablet Take daily.   VITAMIN D  5000 UNITS TABS Take 1 tablet daily.   Dulaglutide (TRULICITY) [id number] [id number]  Inject 0.75 mg into the skin once a week.   ferrous sulfate 325 (65 FE) MG EC tablet Take 325 mg 3  times daily with meals. Takes Mondays, Wednesdays, Fidays   gabapentin 100 MG capsule TAKE 1 CAPSULE 3 x /day    glimepiride 4 MG tablet TAKE 1 TABLET 2 x /day   hydrochlorothiazide  25 MG tablet TAKE 1 TABLET DAILY    losartan 100 MG tablet TAKE 1 TABLET Daily   Magnesium-Zinc  Takes 1200 mg daily.   metFORMIN-XR 500 MG 2 TAKE 2 TABLETS TWICE DAILY   THERAGRAN-M PREMIER 50 PLUS  Take 1 tablet  daily.   Omega-3 FISH OIL 500 MG CAPS Take daily.   phentermine 37.5 MG tablet Take  1 tablet  every Morning  tadalafil 20 MG tablet Take 1/2-1 tab every 2-3 days as needed      Allergies  Allergen Reactions   Crestor [Rosuvastatin Calcium] Cramping        Past Medical History:  Diagnosis Date   Diabetes mellitus without complication (HCC)    Hyperlipidemia, mixed 09/29/2012   Hypertension    Vitamin D deficiency      Health Maintenance  Topic Date Due   COVID-19 Vaccine (1) Never done   Zoster Vaccines- Shingrix (1 of 2) Never done   FOOT EXAM  09/12/2021   HEMOGLOBIN A1C  09/21/2021   OPHTHALMOLOGY EXAM  11/22/2021   Fecal DNA (Cologuard)  10/02/2023   TETANUS/TDAP  06/12/2031   Pneumonia Vaccine 66+ Years old  Completed   Hepatitis C Screening  Completed   HIV Screening  Completed   HPV VACCINES  Aged Out   COLONOSCOPY Discontinued     Immunization History  Administered Date(s) Administered   PNEUMOCOCCAL-20 06/11/2021   Pneumococcal-23 02/16/2013   Td 06/11/2021   Tdap 02/10/2011    Last Colon - 03/10/2012 - Dr  Jarold Motto - Recc 10 year f/u due July/Aug 2023   Past Surgical History:  Procedure Laterality Date   CARPAL TUNNEL RELEASE  2008   bilateral   CERVICAL DISCECTOMY  2003   KNEE ARTHROSCOPY  2010   left     Family History  Problem Relation Age of Onset   Diabetes Father    Hypertension Father    Diabetes Sister    Kidney disease Sister    Heart disease Mother    Stroke Mother    Diabetes Mother    Colon cancer Neg Hx    Stomach cancer Neg Hx      Social History   Tobacco Use   Smoking status: Former    Types: Cigarettes    Quit date: 02/25/1991    Years since quitting: 30.5   Smokeless tobacco: Never  Vaping Use   Vaping Use: Never used  Substance Use Topics   Alcohol use: Not Currently   Drug use: No      ROS Constitutional: Denies fever, chills, weight loss/gain, headaches, insomnia,  night sweats or change in appetite. Does c/o fatigue. Eyes: Denies redness, blurred vision, diplopia, discharge, itchy or watery eyes.  ENT: Denies discharge, congestion, post nasal drip, epistaxis, sore throat, earache, hearing loss, dental pain, Tinnitus, Vertigo, Sinus pain or snoring.  Cardio: Denies chest pain, palpitations, irregular heartbeat, syncope, dyspnea, diaphoresis, orthopnea, PND, claudication or edema Respiratory: denies cough, dyspnea, DOE, pleurisy, hoarseness, laryngitis or wheezing.  Gastrointestinal: Denies dysphagia, heartburn, reflux, water brash, pain, cramps, nausea, vomiting, bloating, diarrhea, constipation, hematemesis, melena, hematochezia, jaundice or hemorrhoids Genitourinary: Denies dysuria, frequency,  discharge, hematuria or flank pain. Has urgency, nocturia x 2-3 & occasional hesitancy. Musculoskeletal: Denies arthralgia, myalgia, stiffness, Jt. Swelling, pain, limp or strain/sprain. Denies Falls. Skin: Denies puritis, rash, hives, warts, acne, eczema or change in skin lesion Neuro: No weakness, tremor, incoordination, spasms, paresthesia or  pain Psychiatric: Denies confusion, memory loss or sensory loss. Denies Depression. Endocrine: Denies change in weight, skin, hair change, nocturia, and paresthesia, diabetic polys, visual blurring or hyper / hypo glycemic episodes.  Heme/Lymph: No excessive bleeding, bruising or enlarged lymph nodes.   Physical Exam  BP 130/84    Pulse (!) 103    Temp (!) 97.3 F (36.3 C)    Resp 16    Ht 5\' 8"  (1.727 m)    Wt 298 lb 9.6 oz (135.4  kg)    SpO2 95%    BMI 45.40 kg/m   General Appearance: Well nourished and well groomed and in no apparent distress.  Eyes: PERRLA, EOMs, conjunctiva no swelling or erythema, normal fundi and vessels. Sinuses: No frontal/maxillary tenderness ENT/Mouth: EACs patent / TMs  nl. Nares clear without erythema, swelling, mucoid exudates. Oral hygiene is good. No erythema, swelling, or exudate. Tongue normal, non-obstructing. Tonsils not swollen or erythematous. Hearing normal.  Neck: Supple, thyroid not palpable. No bruits, nodes or JVD. Respiratory: Respiratory effort normal.  BS equal and clear bilateral without rales, rhonci, wheezing or stridor. Cardio: Heart sounds are normal with regular rate and rhythm and no murmurs, rubs or gallops. Peripheral pulses are normal and equal bilaterally without edema. No aortic or femoral bruits. Chest: symmetric with normal excursions and percussion.  Abdomen: Soft, with Nl bowel sounds. Nontender, no guarding, rebound, hernias, masses, or organomegaly.  Lymphatics: Non tender without lymphadenopathy.  Musculoskeletal: Full ROM all peripheral extremities, joint stability, 5/5 strength, and normal gait. Skin: Warm and dry without rashes, lesions, cyanosis, clubbing or  ecchymosis.  Neuro: Cranial nerves intact, reflexes equal bilaterally. Normal muscle tone, no cerebellar symptoms. Sensation intact.  Pysch: Alert and oriented X 3 with normal affect, insight and judgment appropriate.   Assessment and Plan  1. Annual  Preventative/Screening Exam  \  2. Essential hypertension  - EKG 12-Lead - [place], RETROPERITNL ABD,  LTD - Urinalysis, Routine w reflex microscopic - Microalbumin / creatinine urine ratio - CBC with Differential/Platelet - COMPLETE METABOLIC PANEL WITH GFR - Magnesium - TSH  3. Hyperlipidemia associated with type 2 diabetes mellitus ([place])  - EKG 12-Lead - [place], RETROPERITNL ABD,  LTD - Lipid panel - TSH  4. Type 2 diabetes mellitus with stage 2 chronic kidney  disease, without long-term current use of insulin (HCC)  - EKG 12-Lead - [place], RETROPERITNL ABD,  LTD - Urinalysis, Routine w reflex microscopic - Microalbumin / creatinine urine ratio - HM DIABETES FOOT EXAM - PR LOW EXTEMITY NEUR EXAM DOCUM - Hemoglobin A1c - Insulin, random - Dulaglutide (TRULICITY) 3 0000000 SOPN;  Inject 3 mg as directed once a week.   Dispense: 6 mL; Refill: 3  5. Vitamin D deficiency  - VITAMIN D 25 Hydroxy   6. Morbid obesity with BMI of 40.0-44.9, adult (HCC)  - TSH  7. Statin myopathy   8. BPH with obstruction/lower urinary tract symptoms  - PSA  9. Prostate cancer screening  - PSA  10. Screening for ischemic heart disease  - EKG 12-Lead  11. FHx: heart disease  - EKG 12-Lead - [place], RETROPERITNL ABD,  LTD  12. Smoker  - EKG 12-Lead - [place], RETROPERITNL ABD,  LTD  13. Screening for AAA (aortic abdominal aneurysm)  - [place], RETROPERITNL ABD,  LTD  14. Medication management  - Urinalysis, Routine w reflex microscopic - Microalbumin / creatinine urine ratio - CBC with Differential/Platelet - COMPLETE METABOLIC PANEL WITH GFR - Magnesium - Lipid panel - TSH - Hemoglobin A1c - Insulin, random        Patient was counseled in prudent diet, weight control to achieve/maintain BMI less than 25, BP monitoring, regular exercise and medications as discussed.  Discussed med effects and SE's. Routine screening labs and tests as requested with regular follow-up as recommended.  Over 40 minutes of exam, counseling, chart review and high complex critical decision making was performed   Kirtland Bouchard, MD

## 2021-09-24 ENCOUNTER — Ambulatory Visit (INDEPENDENT_AMBULATORY_CARE_PROVIDER_SITE_OTHER): Payer: Medicare HMO | Admitting: Internal Medicine

## 2021-09-24 ENCOUNTER — Other Ambulatory Visit: Payer: Self-pay

## 2021-09-24 VITALS — BP 130/84 | HR 103 | Temp 97.3°F | Resp 16 | Ht 68.0 in | Wt 298.6 lb

## 2021-09-24 DIAGNOSIS — E559 Vitamin D deficiency, unspecified: Secondary | ICD-10-CM | POA: Diagnosis not present

## 2021-09-24 DIAGNOSIS — N138 Other obstructive and reflux uropathy: Secondary | ICD-10-CM

## 2021-09-24 DIAGNOSIS — Z125 Encounter for screening for malignant neoplasm of prostate: Secondary | ICD-10-CM | POA: Diagnosis not present

## 2021-09-24 DIAGNOSIS — N182 Chronic kidney disease, stage 2 (mild): Secondary | ICD-10-CM | POA: Diagnosis not present

## 2021-09-24 DIAGNOSIS — Z8249 Family history of ischemic heart disease and other diseases of the circulatory system: Secondary | ICD-10-CM

## 2021-09-24 DIAGNOSIS — I10 Essential (primary) hypertension: Secondary | ICD-10-CM | POA: Diagnosis not present

## 2021-09-24 DIAGNOSIS — Z79899 Other long term (current) drug therapy: Secondary | ICD-10-CM

## 2021-09-24 DIAGNOSIS — Z0000 Encounter for general adult medical examination without abnormal findings: Secondary | ICD-10-CM | POA: Diagnosis not present

## 2021-09-24 DIAGNOSIS — E1169 Type 2 diabetes mellitus with other specified complication: Secondary | ICD-10-CM | POA: Diagnosis not present

## 2021-09-24 DIAGNOSIS — G720 Drug-induced myopathy: Secondary | ICD-10-CM

## 2021-09-24 DIAGNOSIS — Z136 Encounter for screening for cardiovascular disorders: Secondary | ICD-10-CM | POA: Diagnosis not present

## 2021-09-24 DIAGNOSIS — Z0001 Encounter for general adult medical examination with abnormal findings: Secondary | ICD-10-CM

## 2021-09-24 DIAGNOSIS — E785 Hyperlipidemia, unspecified: Secondary | ICD-10-CM | POA: Diagnosis not present

## 2021-09-24 DIAGNOSIS — T466X5A Adverse effect of antihyperlipidemic and antiarteriosclerotic drugs, initial encounter: Secondary | ICD-10-CM

## 2021-09-24 DIAGNOSIS — N401 Enlarged prostate with lower urinary tract symptoms: Secondary | ICD-10-CM

## 2021-09-24 DIAGNOSIS — F17200 Nicotine dependence, unspecified, uncomplicated: Secondary | ICD-10-CM

## 2021-09-24 DIAGNOSIS — E1122 Type 2 diabetes mellitus with diabetic chronic kidney disease: Secondary | ICD-10-CM

## 2021-09-24 MED ORDER — TRULICITY 3 MG/0.5ML ~~LOC~~ SOAJ
3.0000 mg | SUBCUTANEOUS | 3 refills | Status: DC
Start: 2021-09-24 — End: 2022-01-10

## 2021-09-25 LAB — CBC WITH DIFFERENTIAL/PLATELET
Absolute Monocytes: 570 cells/uL (ref 200–950)
Basophils Absolute: 72 cells/uL (ref 0–200)
Basophils Relative: 1.2 %
Eosinophils Absolute: 222 cells/uL (ref 15–500)
Eosinophils Relative: 3.7 %
HCT: 42.6 % (ref 38.5–50.0)
Hemoglobin: 13.6 g/dL (ref 13.2–17.1)
Lymphs Abs: 2208 cells/uL (ref 850–3900)
MCH: 26.2 pg — ABNORMAL LOW (ref 27.0–33.0)
MCHC: 31.9 g/dL — ABNORMAL LOW (ref 32.0–36.0)
MCV: 81.9 fL (ref 80.0–100.0)
MPV: 9.6 fL (ref 7.5–12.5)
Monocytes Relative: 9.5 %
Neutro Abs: 2928 cells/uL (ref 1500–7800)
Neutrophils Relative %: 48.8 %
Platelets: 347 10*3/uL (ref 140–400)
RBC: 5.2 10*6/uL (ref 4.20–5.80)
RDW: 13.1 % (ref 11.0–15.0)
Total Lymphocyte: 36.8 %
WBC: 6 10*3/uL (ref 3.8–10.8)

## 2021-09-25 LAB — COMPLETE METABOLIC PANEL WITH GFR
AG Ratio: 1.3 (calc) (ref 1.0–2.5)
ALT: 8 U/L — ABNORMAL LOW (ref 9–46)
AST: 17 U/L (ref 10–35)
Albumin: 4.5 g/dL (ref 3.6–5.1)
Alkaline phosphatase (APISO): 67 U/L (ref 35–144)
BUN/Creatinine Ratio: 17 (calc) (ref 6–22)
BUN: 23 mg/dL (ref 7–25)
CO2: 29 mmol/L (ref 20–32)
Calcium: 10.3 mg/dL (ref 8.6–10.3)
Chloride: 105 mmol/L (ref 98–110)
Creat: 1.36 mg/dL — ABNORMAL HIGH (ref 0.70–1.35)
Globulin: 3.5 g/dL (calc) (ref 1.9–3.7)
Glucose, Bld: 88 mg/dL (ref 65–99)
Potassium: 4.2 mmol/L (ref 3.5–5.3)
Sodium: 142 mmol/L (ref 135–146)
Total Bilirubin: 0.3 mg/dL (ref 0.2–1.2)
Total Protein: 8 g/dL (ref 6.1–8.1)
eGFR: 58 mL/min/{1.73_m2} — ABNORMAL LOW (ref >=60)

## 2021-09-25 LAB — URINALYSIS, ROUTINE W REFLEX MICROSCOPIC
Bilirubin Urine: NEGATIVE
Glucose, UA: NEGATIVE
Hgb urine dipstick: NEGATIVE
Ketones, ur: NEGATIVE
Leukocytes,Ua: NEGATIVE
Nitrite: NEGATIVE
Protein, ur: NEGATIVE
Specific Gravity, Urine: 1.017 (ref 1.001–1.035)
pH: 5.5 (ref 5.0–8.0)

## 2021-09-25 LAB — MICROALBUMIN / CREATININE URINE RATIO
Creatinine, Urine: 80 mg/dL (ref 20–320)
Microalb Creat Ratio: 70 mcg/mg creat — ABNORMAL HIGH (ref <30)
Microalb, Ur: 5.6 mg/dL

## 2021-09-25 LAB — HEMOGLOBIN A1C
Hgb A1c MFr Bld: 6.6 % of total Hgb — ABNORMAL HIGH (ref <5.7)
Mean Plasma Glucose: 143 mg/dL
eAG (mmol/L): 7.9 mmol/L

## 2021-09-25 LAB — LIPID PANEL
Cholesterol: 205 mg/dL — ABNORMAL HIGH (ref <200)
HDL: 45 mg/dL (ref >=40)
LDL Cholesterol (Calc): 137 mg/dL (calc) — ABNORMAL HIGH
Non-HDL Cholesterol (Calc): 160 mg/dL (calc) — ABNORMAL HIGH (ref <130)
Total CHOL/HDL Ratio: 4.6 (calc) (ref <5.0)
Triglycerides: 112 mg/dL (ref <150)

## 2021-09-25 LAB — INSULIN, RANDOM: Insulin: 67.6 u[IU]/mL — ABNORMAL HIGH

## 2021-09-25 LAB — MAGNESIUM: Magnesium: 1.9 mg/dL (ref 1.5–2.5)

## 2021-09-25 LAB — PSA: PSA: 0.61 ng/mL (ref <=4.00)

## 2021-09-25 LAB — VITAMIN D 25 HYDROXY (VIT D DEFICIENCY, FRACTURES): Vit D, 25-Hydroxy: 80 ng/mL (ref 30–100)

## 2021-09-25 LAB — TSH: TSH: 2.64 mIU/L (ref 0.40–4.50)

## 2021-09-25 NOTE — Progress Notes (Signed)
=============================================================== °-   Test results slightly outside the reference range are not unusual. If there is anything important, I will review this with you,  otherwise it is considered normal test values.  If you have further questions,  please do not hesitate to contact me at the office or via My Chart.  =============================================================== ===============================================================  - Kidney functions still look a little dehydrated    Very important to drink adequate amounts of fluids to prevent permanent damage    - Recommend drink at least 6 bottles (16 ounces) of fluids /water /day = 96 Oz ~100 oz  - 100 oz = 3,000 cc or 3 liters / day  - >> That's 1 &1/2 bottles of a 2 liter soda bottle /day !  =============================================================== ===============================================================  - Total Chol = 205    is very high risk for Heart Attack /Stroke /Vascular Dementia     ( Ideal or Goal is less than 180 ! )  & - Bad /Dangerous LDL Chol =   137    - - >> Sitting on a time Bomb !      ( Ideal or Goal is less than 70 ! )  - just waiting to have a Stroke or Heart Attack    - Treating with meds to lower Cholesterol is treating the result of bad diet                                         & NOT treating the cause   - The cause is Bad Diet  and being severely overweight  !   - Read or listen to   Dr Glenard Haring 's book    " How Not to Die ! "    - Recommend a stricter plant based low cholesterol diet   - Cholesterol only comes from animal sources  - ie. meat, dairy, egg yolks  - Eat all the vegetables you want.   - Avoid meat, especially red meat - Beef AND Pork .  - Avoid cheese & dairy - milk & ice cream.    - Cheese is the most concentrated form of trans-fats which  is  the worst thing to clog up our arteries.   - Veggie cheese is OK which can be found in the fresh  produce section at [employer] or [employer] or [employer]  ============================================================ ============================================================  -  A1c = 6.6% - still to high -- Normal or ideal is less than 5.7% ============================================================ ============================================================  -  Vitamin D = 80 - Great - Please keep dose same  ============================================================ ============================================================  -  PSA - Low - Great  ! ============================================================ ============================================================  -  All Else - CBC - Kidneys - Electrolytes - Liver - Magnesium & Thyroid    - all  Normal / OK ============================================================ ============================================================

## 2021-09-25 NOTE — Progress Notes (Signed)
Aorta Scan <3 

## 2021-09-26 NOTE — Progress Notes (Signed)
Pt is aware of lab results and recommendations, did not have any questions for the provider or the nurse

## 2021-10-10 ENCOUNTER — Telehealth: Payer: Self-pay

## 2021-10-10 NOTE — Telephone Encounter (Signed)
The patient meets program eligibility requirements and is enrolled in [place] until the end of the 2023 calendar year.

## 2021-10-23 ENCOUNTER — Other Ambulatory Visit: Payer: Self-pay

## 2021-10-23 ENCOUNTER — Ambulatory Visit (INDEPENDENT_AMBULATORY_CARE_PROVIDER_SITE_OTHER): Payer: Medicare HMO | Admitting: Adult Health

## 2021-10-23 ENCOUNTER — Encounter: Payer: Self-pay | Admitting: Adult Health

## 2021-10-23 VITALS — BP 132/88 | HR 97 | Temp 97.5°F | Wt 297.8 lb

## 2021-10-23 DIAGNOSIS — M79604 Pain in right leg: Secondary | ICD-10-CM

## 2021-10-23 DIAGNOSIS — E785 Hyperlipidemia, unspecified: Secondary | ICD-10-CM

## 2021-10-23 DIAGNOSIS — M79605 Pain in left leg: Secondary | ICD-10-CM

## 2021-10-23 DIAGNOSIS — E1169 Type 2 diabetes mellitus with other specified complication: Secondary | ICD-10-CM | POA: Diagnosis not present

## 2021-10-23 MED ORDER — SIMVASTATIN 10 MG PO TABS: ORAL_TABLET | ORAL | 3 refills | Status: DC

## 2021-10-23 NOTE — Progress Notes (Signed)
Assessment and Plan: ? ?[name] [name] was seen today for spasms. ? ?Diagnoses and all orders for this visit: ? ?Pain in both lower extremities ?Appears simple aching with increased exertion/lifting at his new job; discussed appropriate lifting, wear good shoes, heat and stretching after work, increase weight bearing gradually if able. He can try taking tylenol [phone] mg TID PRN as needed. Follow up if not gradually improving or with any new sx.  ? ?Hyperlipidemia associated with type 2 diabetes mellitus ([place]) ?Discussed his neuropathy and untreated LDL, very important to get sugars and cholesterol down, increase exercise to help with peripheral circulation to try to limit further progression of his neuropathy. On review looks like he did tolerate simvastatin for some time, retry low dose 10 mg daily once above muscle pain improves, consider adding zetia once he reaches max tolerated dose.  ?-     simvastatin (ZOCOR) 10 MG tablet; Take 1 tab daily at night for cholesterol. ? ? ?Further disposition pending results of labs. Discussed med's effects and SE's.   ?Over 15 minutes of exam, counseling, chart review, and critical decision making was performed.  ? ?Future Appointments  ?Date Time Provider [place]  ?12/24/2021 10:30 AM Mull, Townsend Roger, NP [facility] None  ?04/02/2022 10:30 AM Unk Pinto, MD [facility] None  ?09/30/2022 11:00 AM Unk Pinto, MD [facility] None  ? ? ?------------------------------------------------------------------------------------------------------------------ ? ? ?HPI ?BP 132/88   Pulse 97   Temp (!) 97.5 ?F (36.4 ?C)   Wt 297 lb 12.8 oz (135.1 kg)   SpO2 97%   BMI 45.28 kg/m?  ?66 y.o.male with morbid obesity, chronic lumbar back pain/pain syndrome, T2DM with neuropathy presents for evaluation of bil lower extremity aching/pain and muscle cramps.  ? 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 Denies any lumbar pain, joint pain in hips/knees. Denies numbness, tingling, weakness, radicular sx. Hasn't tried anything for discomfort, wanted to check with [place] first.  ? ?Taking gabapentin 100 mg TID PRN for hand neuropathy sx, unchanged.  ? ?T2DM well controlled;  ?Lab Results  ?Component Value Date  ? HGBA1C 6.6 (H) 09/24/2021  ? ?Not currently on chol tx; rosuvastatin 40 mg was tried with extreme cramping, stopped. Never tried lower dose. On review ? Did tolerate simvastatin, pravastatin, zetia.  ?Lab Results  ?Component Value Date  ? CHOL 205 (H) 09/24/2021  ? HDL 45 09/24/2021  ? LDLCALC 137 (H) 09/24/2021  ? TRIG 112 09/24/2021  ? CHOLHDL 4.6 09/24/2021  ? ? ?Past Medical History:  ?Diagnosis Date  ? Diabetes mellitus without complication ([place])   ? Hyperlipidemia, mixed 09/29/2012  ? Hypertension   ? Vitamin D deficiency   ?  ? ?Allergies  ?Allergen Reactions  ? Crestor [Rosuvastatin Calcium] Other (See Comments)  ?  Cramping  ? ? ?Current Outpatient Medications on File Prior to Visit  ?Medication Sig  ? Alcohol Swabs (DROPSAFE ALCOHOL PREP) 70 % PADS USE TO CHECK BLOOD SUGAR 1 TIME DAILY  ? Ascorbic Acid (VITA-C PO) Take by mouth. 3 times a week  ? aspirin EC 81 MG tablet Take 81 mg by mouth daily.  ? Blood Glucose Calibration (TRUE METRIX LEVEL 1) Low SOLN Use with glucometer. Check blood sugar  1 time daily-E11.2.  ? Blood Glucose Monitoring Suppl (TRUE METRIX METER) w/Device KIT Check blood sugar 1 time daily- E11.2  ? Cholecalciferol (VITAMIN D-3) 5000 UNITS TABS Take 1 tablet by mouth daily.  ? Dulaglutide (TRULICITY) 3 JH/1.8DU SOPN Inject 3 mg as directed once a week.  ? ferrous sulfate 325 (65 FE) MG EC tablet Take 325 mg by mouth 3 (three) times daily with meals. Takes Mondays,  Wednesdays, Fidays  ? gabapentin (NEURONTIN) 100 MG capsule TAKE 1 CAPSULE THREE TIMES DAILY FOR NEUROPATHY PAINS IN HANDS  ? glimepiride (AMARYL) 4 MG tablet TAKE 1 TABLET TWICE DAILY WITH MEALS FOR DIABETES  ? glucose blood (TRUE METRIX BLOOD GLUCOSE TEST) test strip CHECK BLOOD SUGAR 1 TIME DAILY  ? hydrochlorothiazide (HYDRODIURIL) 25 MG tablet TAKE 1 TABLET DAILY FOR BLOOD PRESSURE AND FLUID  ? losartan (COZAAR) 100 MG tablet TAKE 1 TABLET EVERY DAY FOR BLOOD PRESSURE AND DIABETIC KIDNEY PROTECTION  ? Magnesium-Zinc (MAGNESIUM-CHELATED ZINC PO) Take 400 mg by mouth daily. Takes 1200 mg daily.  ? metFORMIN (GLUCOPHAGE-XR) 500 MG 24 hr tablet TAKE 2 TABLETS TWICE DAILY WITH MEALS FOR DIABETES  ? Multiple Vitamins-Minerals (THERAGRAN-M PREMIER 50 PLUS PO) Take 1 tablet by mouth daily.  ? Omega-3 Fatty Acids (FISH OIL) 500 MG CAPS Take by mouth daily.  ? phentermine (ADIPEX-P) 37.5 MG tablet Take  1 tablet  every Morning  for Dieting & Weight Loss  ? tadalafil (CIALIS) 20 MG tablet TAKE ONE-HALF TO ONE TABLET BY MOUTH EVERY 2 TO 3 DAYS AS NEEDED  ? TRUEplus Lancets 30G MISC CHECK BLOOD SUGAR 1 TIME DAILY  ? ?No current facility-administered medications on file prior to visit.  ? ? ?ROS: all negative except above.  ? ?Physical Exam: ? ?BP 132/88   Pulse 97   Temp (!) 97.5 ?F (36.4 ?C)   Wt 297 lb 12.8 oz (135.1 kg)   SpO2 97%   BMI 45.28 kg/m?  ? ? ?General Appearance: Well nourished, in no apparent distress. ?Eyes: conjunctiva no swelling or erythema ?ENT/Mouth: mask in place; Hearing normal.  ?Neck: Supple ?Respiratory: Respiratory effort normal, BS equal bilaterally without rales, rhonchi, wheezing or stridor.  ?Cardio: RRR with no MRGs. Brisk peripheral pulses without edema.  ?Abdomen: Soft, morbidly obese abdomen, + BS.  Non tender, no guarding, rebound, hernias, masses. ?Lymphatics: Non tender without lymphadenopathy.  ?Musculoskeletal: Patient is able to ambulate well. Gait is not  Antalgic. Straight leg  raising with dorsiflexion negative bilaterally for radicular symptoms. Sensory exam in the legs are  normal . Knee reflexes are normal Ankle reflexes are normal Strength is normal and symmetric in arms and legs. There is not SI tenderness to palpation.  There is not paraspinal muscle spasm.  There is not midline tenderness.  ROM of spine no limited due to pain. He has mild bil thigh tenderness to palpation.  ?Skin: Warm, dry without rashes, lesions, ecchymosis.  ?Neuro: Normal muscle tone ?Psych: Awake and oriented X 3, normal affect, Insight and Judgment appropriate.  ? ?  ? ?[name], NP ?3:46 PM ?[facility] ? ?

## 2021-10-23 NOTE — Patient Instructions (Addendum)
? ?Can try tylenol [phone] mg three times a day as needed ? ?If severe pain, can occasionally take advil or aleve (take with food, only do occasionally due to your kidney functions).  ? ? ? ? ? ?Simvastatin Tablets ?What is this medication? ?SIMVASTATIN (SIM va sta tin) treats high cholesterol and reduces the risk of heart attack and stroke. It works by decreasing the bad cholesterol and fats (such as LDL, triglycerides), and increasing good cholesterol (HDL) in your blood. It belongs to a group of medications called statins. Changes to diet and exercise are often combined with this medication. ?This medicine may be used for other purposes; ask your health care provider or pharmacist if you have questions. ?COMMON BRAND NAME(S): Zocor ?What should I tell my care team before I take this medication? ?They need to know if you have any of these conditions: ?Diabetes (high blood sugar) ?If you often drink alcohol ?Kidney disease ?Liver disease ?Muscle cramps, pain ?Stroke ?Thyroid disease ?An unusual or allergic reaction to simvastatin, other medications, foods, dyes, or preservatives ?Pregnant or trying to get pregnant ?Breast-feeding ?How should I use this medication? ?Take this medication by mouth. Take it as directed on the prescription label at the same time every day. You can take it with or without food. If it upsets your stomach, take it with food. Keep taking it unless your care team tells you to stop. ?Do not take this medication with grapefruit juice. ?Talk to your care team about the use of this medication in children. While it may be prescribed for children as young as 10 for selected conditions, precautions do apply. ?Overdosage: If you think you have taken too much of this medicine contact a poison control center or emergency room at once. ?NOTE: This medicine is only for you. Do not share this medicine with others. ?What if I miss a dose? ?If you miss a dose, take it as soon as you can. If it is almost  time for your next dose, take only that dose. Do not take double or extra doses. ?What may interact with this medication? ?Do not take this medication with any of the following: ?Antiviral medications for HIV or AIDS ?Certain antibiotics like clarithromycin, erythromycin, telithromycin ?Certain medications for fungal infections like ketoconazole, itraconazole, posaconazole, voriconazole ?Conivaptan ?Cyclosporine ?Danazol ?Gemfibrozil ?Idelalisib ?Mifepristone, RU-486 ?Nefazodone ?Supplements like red yeast rice ?This medication may also interact with the following: ?Alcohol ?Certain medications for blood pressure or heart disease like amlodipine, diltiazem, verapamil ?Certain medications for irregular heart beat like amiodarone and dronedarone ?Certain medications that treat or prevent blood clots like warfarin ?Colchicine ?Digoxin ?Grapefruit juice ?Lomitapide ?Niacin ?Ranolazine ?This list may not describe all possible interactions. Give your health care provider a list of all the medicines, herbs, non-prescription drugs, or dietary supplements you use. Also tell them if you smoke, drink alcohol, or use illegal drugs. Some items may interact with your medicine. ?What should I watch for while using this medication? ?Visit your health care provider for regular checks on your progress. Tell your health care provider if your symptoms do not start to get better or if they get worse. ?Your health care provider may tell you to stop taking this medication if you develop muscle problems. If your muscle problems do not go away after stopping this medication, contact your health care provider. ?Do not become pregnant while taking this medication. Women should inform their health care provider if they wish to become pregnant or think they might be pregnant. There  is potential for serious harm to an unborn child. Talk to your health care provider for more information. Do not breast-feed an infant while taking this  medication. ?This medication may increase blood sugar. Ask your health care provider if changes in diet or medications are needed if you have diabetes. ?If you are going to need surgery or other procedure, tell your health care provider that you are using this medication. ?Taking this medication is only part of a total heart healthy program. Your health care provider may give you a special diet to follow. Avoid alcohol. Avoid smoking. Ask your health care provider how much you should exercise. ?What side effects may I notice from receiving this medication? ?Side effects that you should report to your care team as soon as possible: ?Allergic reactions--skin rash, itching, hives, swelling of the face, lips, tongue, or throat ?High blood sugar (hyperglycemia)--increased thirst or amount of urine, unusual weakness, fatigue, blurry vision ?Liver injury--right upper belly pain, loss of appetite, nausea, light-colored stool, dark yellow or brown urine, yellowing skin or eyes, unusual weakness, fatigue ?Muscle injury--unusual weakness, fatigue, muscle pain, dark yellow or brown urine, decrease in amount of urine ?Redness, blistering, peeling or loosening of the skin, including inside the mouth ?Side effects that usually do not require medical attention (report to your care team if they continue or are bothersome): ?Constipation ?Headache ?Nausea ?Stomach pain ?This list may not describe all possible side effects. Call your doctor for medical advice about side effects. You may report side effects to FDA at 1-800-FDA-1088. ?Where should I keep my medication? ?Keep out of the reach of children and pets. ?Store at room temperature between 20 and 25 degrees C (68 and 77 degrees F). Get rid of any unused medication after the expiration date. ?To get rid of medications that are no longer needed or have expired: ?Take the medication to a medication take-back program. Check with your pharmacy or law enforcement to find a  location. ?If you cannot return the medication, check the label or package insert to see if the medication should be thrown out in the garbage or flushed down the toilet. If you are not sure, ask your care team. If it is safe to put it in the trash, take the medication out of the container. Mix the medication with cat litter, dirt, coffee grounds, or other unwanted substance. Seal the mixture in a bag or container. Put it in the trash. ?NOTE: This sheet is a summary. It may not cover all possible information. If you have questions about this medicine, talk to your doctor, pharmacist, or health care provider. ?? 2022 Elsevier/Gold Standard (2020-08-31 00:00:00) ? ?

## 2021-11-15 ENCOUNTER — Other Ambulatory Visit: Payer: Self-pay | Admitting: Internal Medicine

## 2021-12-06 ENCOUNTER — Other Ambulatory Visit: Payer: Self-pay | Admitting: Adult Health

## 2021-12-06 ENCOUNTER — Encounter: Payer: Self-pay | Admitting: Adult Health

## 2021-12-06 ENCOUNTER — Telehealth: Payer: Self-pay

## 2021-12-06 DIAGNOSIS — E1169 Type 2 diabetes mellitus with other specified complication: Secondary | ICD-10-CM

## 2021-12-06 DIAGNOSIS — E119 Type 2 diabetes mellitus without complications: Secondary | ICD-10-CM | POA: Diagnosis not present

## 2021-12-06 DIAGNOSIS — G720 Drug-induced myopathy: Secondary | ICD-10-CM | POA: Insufficient documentation

## 2021-12-06 LAB — HM DIABETES EYE EXAM

## 2021-12-06 MED ORDER — EZETIMIBE 10 MG PO TABS: ORAL_TABLET | ORAL | 3 refills | Status: DC

## 2021-12-06 NOTE — Telephone Encounter (Signed)
Patient states that he cannot take Simvastatin, gives him muscle spasms.  ?Please advise ?

## 2021-12-09 NOTE — Telephone Encounter (Signed)
Patient aware.

## 2021-12-11 ENCOUNTER — Encounter: Payer: Self-pay | Admitting: Internal Medicine

## 2021-12-17 ENCOUNTER — Other Ambulatory Visit: Payer: Self-pay | Admitting: Nurse Practitioner

## 2021-12-17 MED ORDER — PHENTERMINE HCL 37.5 MG PO TABS: ORAL_TABLET | ORAL | 1 refills | Status: DC

## 2021-12-23 DIAGNOSIS — H52209 Unspecified astigmatism, unspecified eye: Secondary | ICD-10-CM | POA: Diagnosis not present

## 2021-12-23 DIAGNOSIS — H5203 Hypermetropia, bilateral: Secondary | ICD-10-CM | POA: Diagnosis not present

## 2021-12-23 DIAGNOSIS — H524 Presbyopia: Secondary | ICD-10-CM | POA: Diagnosis not present

## 2021-12-23 NOTE — Progress Notes (Deleted)
MEDICARE ANNUAL WELLNESS VISIT AND FOLLOW UP   Assessment:   [name] [name] was seen today for follow-up and medicare wellness.  Diagnoses and all orders for this visit:  Encounter for Medicare annual wellness exam Yearly  Essential hypertension Continue medication: Cozaar 111m, HCTZ 369mdaily Monitor blood pressure at home; call if consistently over 130/80 Continue DASH diet.   Reminder to go to the ER if any CP, SOB, nausea, dizziness, severe HA, changes vision/speech, left arm numbness and tingling and jaw pain.   Hyperlipidemia, mixed associated with Type 2 Diabetes Mellitus/Statin Myopathy    Simvastatin was restarted ?add zetia  Type 2 diabetes mellitus with stage 2 chronic kidney disease, without long-term current use of insulin (HCC) Taking Metformin 50059mwo tablets twice a day Not checking blood glucose, discussed this at length with patien       Discussed dietary and exercise modifications  Vitamin D deficiency Continue supplementation 5,000IU daily  Gastroesophageal reflux disease, unspecified whether esophagitis present Doing well Monitoring diet and avoiding triggers Increase water intake  Morbid obesity with BMI of 40.0-44.9, adult (HCC) Discussed dietary and exercise modifications at length with patient.  CKD stage 2 due to type 2 diabetes mellitus (HCC) Continue glucose monitoring Continue medications Increase fluids Avoid NSAIDS Discussed dietary modifications and exercise Will continue to monitor  Erectile dysfunction associated with type 2 diabetes mellitus (HCC) Continue Cialis 20 mg 1/2-1 tab every 2-3 days as needed Discussed PRN use, was taking daily without desired effect Discussed medication and side effects  Aneurysm artery, iliac common (HCC) Control blood pressure, cholesterol, glucose, increase exercise.   Medication management Continued     Further disposition pending results if labs check today. Discussed med's effects and SE's.    Over 30 minutes of face to face interview, exam, counseling, chart review, and critical decision making was performed.    Future Appointments  Date Time Provider DepHartwell/04/2022 10:30 AM MulMagda BernheimP [facility] None  04/02/2022 10:30 AM McKUnk PintoD [facility] None  09/30/2022 11:00 AM McKUnk PintoD [facility] None     Plan:   During the course of the visit the patient was educated and counseled about appropriate screening and preventive services including:   Pneumococcal vaccine  Influenza vaccine Prevnar 13 Td vaccine Screening electrocardiogram Colorectal cancer screening Diabetes screening Glaucoma screening Nutrition counseling    Subjective:  [name] [name] a 66 42o. AAM who presents for Medicare Annual Wellness Visit and 3 month follow up for HTN, HLD, DMII with CKD, and vitamin D Def. He also has obstructive sleep apnea and on CPAP.  He is on disability for bilateral carpel tunnel syndrome. He has retired from [employer] is prescribed phentermine for weight loss he is going to get prescription at pharmacy to restart.  He denies palpitations, anxiety, trouble sleeping, elevated BP. He does recall benefit when taking previously.  BMI is There is no height or weight on file to calculate BMI., he is currently working on diet and exercise, would like to get down to 185 lb. He is going to restart phentermine.   Wt Readings from Last 3 Encounters:  10/23/21 297 lb 12.8 oz (135.1 kg)  09/24/21 298 lb 9.6 oz (135.4 kg)  06/11/21 295 lb 12.8 oz (134.2 kg)   His HTN predates 2007.  He has not recently been checking BPs at home, today their BP is   He does workout, cutting grass and [facility]  chest pain, shortness of breath, dizziness.    He is not on cholesterol medication (rosuvastatin but had myalgias, tolerated simvastatin 40 mg daily and pravachol all caused myalgias).  He continues to take Fish oil daily. His  cholesterol is not at goal. The cholesterol last visit was:   Lab Results  Component Value Date   CHOL 205 (H) 09/24/2021   HDL 45 09/24/2021   LDLCALC 137 (H) 09/24/2021   TRIG 112 09/24/2021   CHOLHDL 4.6 09/24/2021   He has been working on diet and exercise for Diabetes controlled by metformin with diabetic chronic kidney disease and with diabetic polyneuropathy, he is on gabapentin which is helping, he is on bASA, he is on ACE/ARB, and denies polydipsia, polyuria and visual disturbances. He has not been check fasting glucose. Blood sugars have been running in the 80's.Last A1C was:  Lab Results  Component Value Date   HGBA1C 6.6 (H) 09/24/2021   Last GFR Lab Results  Component Value Date   GFRAA 79 12/17/2020   Patient is on Vitamin D supplement.  Last visit he was at goal. Lab Results  Component Value Date   VD25OH 80 09/24/2021      Pt cut is hand  Medication Review: Current Outpatient Medications on File Prior to Visit  Medication Sig Dispense Refill   Alcohol Swabs (DROPSAFE ALCOHOL PREP) 70 % PADS USE TO CHECK BLOOD SUGAR 1 TIME DAILY 100 each 3   Ascorbic Acid (VITA-C PO) Take by mouth. 3 times a week     aspirin EC 81 MG tablet Take 81 mg by mouth daily.     Blood Glucose Calibration (TRUE METRIX LEVEL 1) Low SOLN Use with glucometer. Check blood sugar 1 time daily-E11.2. 1 each 3   Blood Glucose Monitoring Suppl (TRUE METRIX METER) w/Device KIT Check blood sugar 1 time daily- E11.2 1 kit 0   Cholecalciferol (VITAMIN D-3) 5000 UNITS TABS Take 1 tablet by mouth daily.     Dulaglutide (TRULICITY) 3 ZD/6.3OV SOPN Inject 3 mg as directed once a week. 6 mL 3   ezetimibe (ZETIA) 10 MG tablet Take 1 tab daily for cholesterol. 90 tablet 3   ferrous sulfate 325 (65 FE) MG EC tablet Take 325 mg by mouth 3 (three) times daily with meals. Takes Mondays, Wednesdays, Fidays     gabapentin (NEURONTIN) 100 MG capsule TAKE 1 CAPSULE THREE TIMES DAILY FOR NEUROPATHY PAINS IN HANDS 270  capsule 3   glimepiride (AMARYL) 4 MG tablet TAKE 1 TABLET TWICE DAILY WITH MEALS FOR DIABETES 180 tablet 0   glucose blood (TRUE METRIX BLOOD GLUCOSE TEST) test strip CHECK BLOOD SUGAR 1 TIME DAILY 100 strip 3   hydrochlorothiazide (HYDRODIURIL) 25 MG tablet TAKE 1 TABLET DAILY FOR BLOOD PRESSURE AND FLUID 90 tablet 3   losartan (COZAAR) 100 MG tablet TAKE 1 TABLET EVERY DAY FOR BLOOD PRESSURE AND DIABETIC KIDNEY PROTECTION 90 tablet 3   Magnesium-Zinc (MAGNESIUM-CHELATED ZINC PO) Take 400 mg by mouth daily. Takes 1200 mg daily.     metFORMIN (GLUCOPHAGE-XR) 500 MG 24 hr tablet TAKE 2 TABLETS TWICE DAILY WITH MEALS FOR DIABETES 360 tablet 3   Multiple Vitamins-Minerals (THERAGRAN-M PREMIER 50 PLUS PO) Take 1 tablet by mouth daily.     Omega-3 Fatty Acids (FISH OIL) 500 MG CAPS Take by mouth daily.     phentermine (ADIPEX-P) 37.5 MG tablet Take  1 tablet  every Morning  for Dieting & Weight Loss 90 tablet 1   tadalafil (CIALIS) 20  MG tablet TAKE ONE-HALF TO ONE TABLET BY MOUTH EVERY 2 TO 3 DAYS AS NEEDED 30 tablet 2   TRUEplus Lancets 30G MISC CHECK BLOOD SUGAR 1 TIME DAILY 100 each 3   No current facility-administered medications on file prior to visit.    Allergies: Allergies  Allergen Reactions   Crestor [Rosuvastatin Calcium] Other (See Comments)    Cramping   Simvastatin Other (See Comments)    Muscle cramps    Current Problems (verified) has HTN (hypertension); Hyperlipidemia associated with type 2 diabetes mellitus ([place]); GERD (gastroesophageal reflux disease); Morbid obesity (BMI 40+); Vitamin D deficiency; T2_NIDDM w/ Stage 2 CKD ([place]); Medication management; Aneurysm artery, iliac common ([place]); Chronic pain syndrome; Peripheral sensory neuropathy due to T2_NIDDM; Erectile dysfunction associated with type 2 diabetes mellitus ([place]); Chronic lower back pain; Gluttony; Arthritis of right knee; CKD stage 2 due to type 2 diabetes mellitus ([place]); and Statin myopathy on their problem  list.  Screening Tests Immunization History  Administered Date(s) Administered   PNEUMOCOCCAL CONJUGATE-20 06/11/2021   Pneumococcal-Unspecified 02/16/2013   Td 06/11/2021   Tdap 02/10/2011   Preventative care: Last colonoscopy: 2013, due 2023 Ct head 11/2014 US aorta 2015   Prior vaccinations: TD or Tdap: 06/11/21  Influenza: Declined  Pneumococcal: 06/11/21 Prevnar13: Declined until 65 Shingles/Zostavax: Declined SARS-COV-Moderna Complete: 04/2020   Names of Other Physician/Practitioners you currently use: 1. [facility] here for primary care 2. Dr. Sherral Hammers, eye doctor, last visit 12/05/20 3. Does not have a dentist- reminded to schedule  Patient Care Team: Unk Pinto, MD as PCP - General (Internal Medicine) Sable Feil, MD as Consulting Physician (Gastroenterology) Monna Fam, MD as Consulting Physician (Ophthalmology) Elsie Saas, MD as Consulting Physician (Orthopedic Surgery) Leeroy Cha, MD as Consulting Physician (Neurosurgery) Gean Birchwood, DPM as Consulting Physician (Podiatry)  Surgical: He  has a past surgical history that includes Carpal tunnel release (2008); Knee arthroscopy (2010); and Cervical discectomy (2003). Family His family history includes Diabetes in his father, mother, and sister; Heart disease in his mother; Hypertension in his father; Kidney disease in his sister; Stroke in his mother. Social history  He reports that he quit smoking about 30 years ago. His smoking use included cigarettes. He has never used smokeless tobacco. He reports that he does not currently use alcohol. He reports that he does not use drugs.  MEDICARE WELLNESS OBJECTIVES: Physical activity:   Cardiac risk factors:   Depression/mood screen:      09/23/2021   11:20 PM  Depression screen PHQ 2/9  Decreased Interest 0  Down, Depressed, Hopeless 0  PHQ - 2 Score 0    ADLs:     09/23/2021   11:22 PM  06/11/2021   12:08 PM  In your present state of health, do you have any difficulty performing the following activities:  Hearing? 0 0  Vision? 0 0  Difficulty concentrating or making decisions? 0 0  Walking or climbing stairs? 0 0  Dressing or bathing? 0 0  Doing errands, shopping? 0 0     Cognitive Testing  Alert? Yes  Normal Appearance?Yes  Oriented to person? Yes  Place? Yes   Time? Yes  Recall of three objects?  Yes  Can perform simple calculations? Yes  Displays appropriate judgment?Yes  Can read the correct time from a watch face?Yes  EOL planning:     Do you have any new medications? Current Outpatient Medications on File Prior to Visit  Medication Sig   Alcohol  Swabs (DROPSAFE ALCOHOL PREP) 70 % PADS USE TO CHECK BLOOD SUGAR 1 TIME DAILY   Ascorbic Acid (VITA-C PO) Take by mouth. 3 times a week   aspirin EC 81 MG tablet Take 81 mg by mouth daily.   Blood Glucose Calibration (TRUE METRIX LEVEL 1) Low SOLN Use with glucometer. Check blood sugar 1 time daily-E11.2.   Blood Glucose Monitoring Suppl (TRUE METRIX METER) w/Device KIT Check blood sugar 1 time daily- E11.2   Cholecalciferol (VITAMIN D-3) 5000 UNITS TABS Take 1 tablet by mouth daily.   Dulaglutide (TRULICITY) 3 OH/6.0VP SOPN Inject 3 mg as directed once a week.   ezetimibe (ZETIA) 10 MG tablet Take 1 tab daily for cholesterol.   ferrous sulfate 325 (65 FE) MG EC tablet Take 325 mg by mouth 3 (three) times daily with meals. Takes Mondays, Wednesdays, Fidays   gabapentin (NEURONTIN) 100 MG capsule TAKE 1 CAPSULE THREE TIMES DAILY FOR NEUROPATHY PAINS IN HANDS   glimepiride (AMARYL) 4 MG tablet TAKE 1 TABLET TWICE DAILY WITH MEALS FOR DIABETES   glucose blood (TRUE METRIX BLOOD GLUCOSE TEST) test strip CHECK BLOOD SUGAR 1 TIME DAILY   hydrochlorothiazide (HYDRODIURIL) 25 MG tablet TAKE 1 TABLET DAILY FOR BLOOD PRESSURE AND FLUID   losartan (COZAAR) 100 MG tablet TAKE 1 TABLET EVERY DAY FOR BLOOD PRESSURE AND  DIABETIC KIDNEY PROTECTION   Magnesium-Zinc (MAGNESIUM-CHELATED ZINC PO) Take 400 mg by mouth daily. Takes 1200 mg daily.   metFORMIN (GLUCOPHAGE-XR) 500 MG 24 hr tablet TAKE 2 TABLETS TWICE DAILY WITH MEALS FOR DIABETES   Multiple Vitamins-Minerals (THERAGRAN-M PREMIER 50 PLUS PO) Take 1 tablet by mouth daily.   Omega-3 Fatty Acids (FISH OIL) 500 MG CAPS Take by mouth daily.   phentermine (ADIPEX-P) 37.5 MG tablet Take  1 tablet  every Morning  for Dieting & Weight Loss   tadalafil (CIALIS) 20 MG tablet TAKE ONE-HALF TO ONE TABLET BY MOUTH EVERY 2 TO 3 DAYS AS NEEDED   TRUEplus Lancets 30G MISC CHECK BLOOD SUGAR 1 TIME DAILY   No current facility-administered medications on file prior to visit.     Objective:   There were no vitals filed for this visit.   There is no height or weight on file to calculate BMI.  General Appearance: Morbidly obese, in no apparent distress. Eyes: PERRLA, EOMs, conjunctiva no swelling or erythema Sinuses: No Frontal/maxillary tenderness ENT/Mouth: Ext aud canals clear, TMs without erythema, bulging. No erythema, swelling, or exudate on post pharynx.  Tonsils not swollen or erythematous. Hearing normal.  Neck: Supple, thyroid normal.  Respiratory: Respiratory effort normal, BS equal bilaterally without rales, rhonchi, wheezing or stridor.  Cardio: RRR with no MRGs. Diminished peripheral pulses without edema.  Abdomen: Soft, obese, + BS.  Non tender, no guarding, rebound, hernias, masses. Lymphatics: Non tender without lymphadenopathy.  Musculoskeletal: Full ROM except right knee,wearing brace.  Normal gait Skin: Warm, dry without rashes, lesions, ecchymosis.  Neuro: Cranial nerves intact. No cerebellar symptoms.  Psych: Awake and oriented X 3, normal affect, Insight and Judgment appropriate.   Medicare Attestation I have personally reviewed:  The patient's medical and social history Their use of alcohol, tobacco or illicit drugs Their current  medications and supplements The patient's functional ability including ADLs,fall risks, home safety risks, cognitive, and hearing and visual impairment Diet and physical activities Evidence for depression or mood disorders  The patient's weight, height, BMI, and visual acuity have been recorded in the chart.  I have made referrals, counseling, and provided  education to the patient based on review of the above and I have provided the patient with a written personalized care plan for preventive services.     Magda Bernheim ANP-C  [facility]  12/23/2021

## 2021-12-24 ENCOUNTER — Ambulatory Visit: Payer: Medicare HMO | Admitting: Nurse Practitioner

## 2021-12-27 ENCOUNTER — Ambulatory Visit: Payer: Medicare HMO | Admitting: Nurse Practitioner

## 2022-01-01 ENCOUNTER — Emergency Department (HOSPITAL_COMMUNITY)
Admission: EM | Admit: 2022-01-01 | Discharge: 2022-01-01 | Disposition: A | Discharge status: Home / Self Care | Payer: Medicare HMO | Attending: Emergency Medicine | Admitting: Emergency Medicine

## 2022-01-01 DIAGNOSIS — Y92480 Sidewalk as the place of occurrence of the external cause: Secondary | ICD-10-CM | POA: Insufficient documentation

## 2022-01-01 DIAGNOSIS — Z5321 Procedure and treatment not carried out due to patient leaving prior to being seen by health care provider: Secondary | ICD-10-CM | POA: Diagnosis not present

## 2022-01-01 DIAGNOSIS — W01198A Fall on same level from slipping, tripping and stumbling with subsequent striking against other object, initial encounter: Secondary | ICD-10-CM | POA: Diagnosis not present

## 2022-01-01 DIAGNOSIS — Z043 Encounter for examination and observation following other accident: Secondary | ICD-10-CM | POA: Insufficient documentation

## 2022-01-01 NOTE — ED Notes (Signed)
Pt called for vital reassessment x3. Pt not in lobby. ?

## 2022-01-01 NOTE — ED Notes (Signed)
Pt yelling. Stating he is tired of waiting. Triage RN spoke to patient. Pt threw BP cuff on lobby floor and exited the ED. ?

## 2022-01-01 NOTE — ED Triage Notes (Signed)
Pt states he tripped over the sidewalk yesterday and fell. States he fell onto his right knee and hit his head on the side of the house. Denies LOC. ?

## 2022-01-02 ENCOUNTER — Ambulatory Visit (INDEPENDENT_AMBULATORY_CARE_PROVIDER_SITE_OTHER): Payer: Medicare HMO | Admitting: Nurse Practitioner

## 2022-01-02 ENCOUNTER — Ambulatory Visit
Admission: RE | Admit: 2022-01-02 | Discharge: 2022-01-02 | Disposition: A | Discharge status: Home / Self Care | Payer: Medicare HMO | Source: Ambulatory Visit | Attending: Nurse Practitioner | Admitting: Nurse Practitioner

## 2022-01-02 ENCOUNTER — Encounter: Payer: Self-pay | Admitting: Nurse Practitioner

## 2022-01-02 VITALS — BP 124/70 | HR 96 | Temp 97.0°F | Wt 273.2 lb

## 2022-01-02 DIAGNOSIS — R600 Localized edema: Secondary | ICD-10-CM

## 2022-01-02 DIAGNOSIS — M25562 Pain in left knee: Secondary | ICD-10-CM | POA: Diagnosis not present

## 2022-01-02 DIAGNOSIS — E1122 Type 2 diabetes mellitus with diabetic chronic kidney disease: Secondary | ICD-10-CM

## 2022-01-02 DIAGNOSIS — M25561 Pain in right knee: Secondary | ICD-10-CM

## 2022-01-02 DIAGNOSIS — W19XXXA Unspecified fall, initial encounter: Secondary | ICD-10-CM

## 2022-01-02 DIAGNOSIS — I10 Essential (primary) hypertension: Secondary | ICD-10-CM | POA: Diagnosis not present

## 2022-01-02 DIAGNOSIS — M1711 Unilateral primary osteoarthritis, right knee: Secondary | ICD-10-CM

## 2022-01-02 DIAGNOSIS — N182 Chronic kidney disease, stage 2 (mild): Secondary | ICD-10-CM

## 2022-01-02 DIAGNOSIS — M7989 Other specified soft tissue disorders: Secondary | ICD-10-CM | POA: Diagnosis not present

## 2022-01-02 NOTE — Progress Notes (Signed)
Assessment and Plan:  [name] [name] was seen today for an episodic visit.  Diagnoses and all order for this visit:  1. Fall, initial encounter Discussed fall prevention Monitor surroundings. Skid proof shoes.   2. Acute pain of right knee RICE Method. Continue Aleve PRN  - DG Knee Complete 4 Views Right; Future  3. Acute pain of left knee RICE Method Continue Aleve PRN  4. Localized edema RICE Method Continue to monitor for increase in swelling.  5. Arthritis of right knee Continue to monitor.  6. Primary hypertension Controlled   7. Type 2 diabetes mellitus with stage 2 chronic kidney disease, without long-term current use of insulin (HCC) Keep BG well controlled for adequate healing. Continue  medications as directed. Monitor intake of carbohydrates, sugars.  8. Morbid obesity (BMI 40+) Continue to work on weight loss. Discussed diet rich in fruits and vegetables, less red meat and fatty foods. Remain active.    Notify office for further evaluation and treatment, questions or concerns if s/s fail to improve. The risks and benefits of my recommendations, as well as other treatment options were discussed with the patient today. Questions were answered.  Further disposition pending results of labs. Discussed med's effects and SE's.    Over 20 minutes of exam, counseling, chart review, and critical decision making was performed.   Future Appointments  Date Time Provider [place]  01/10/2022 10:30 AM Magda Bernheim, NP [facility] None  04/02/2022 10:30 AM Unk Pinto, MD [facility] None  09/30/2022 11:00 AM Unk Pinto, MD [facility] None    ------------------------------------------------------------------------------------------------------------------   HPI BP 124/70   Pulse 96   Temp (!) 97 F (36.1 C)   Wt 273 lb 3.2 oz (123.9 kg)   SpO2 96%   BMI 40.94 kg/m   66 y.o.male presents for initial fall that occurred two days ago.  He  states that he fell over some uneven sidewalk when trying to carry a large gas can.  He hit harder on his right knee than left.  He complains of LROM, soreness, edema in right knee.  Pain 6/10.  Left knee is mildly painful, no LROM, soreness.  Pain 3/10.  Has been taking Aleve and Gabapentin with effectiveness. He is trying to implement the RICE method.    He went to the ER yesterday 01/01/22 but left d/t long wait time.   He denies any syncopal episode, LOC.    BP is well controlled in office.    Past Medical History:  Diagnosis Date   Diabetes mellitus without complication ([place])    Hyperlipidemia, mixed 09/29/2012   Hypertension    Vitamin D deficiency      Allergies  Allergen Reactions   Crestor [Rosuvastatin Calcium] Other (See Comments)    Cramping   Simvastatin Other (See Comments)    Muscle cramps    Current Outpatient Medications on File Prior to Visit  Medication Sig   Alcohol Swabs (DROPSAFE ALCOHOL PREP) 70 % PADS USE TO CHECK BLOOD SUGAR 1 TIME DAILY   Ascorbic Acid (VITA-C PO) Take by mouth. 3 times a week   aspirin EC 81 MG tablet Take 81 mg by mouth daily.   Blood Glucose Calibration (TRUE METRIX LEVEL 1) Low SOLN Use with glucometer. Check blood sugar 1 time daily-E11.2.   Blood Glucose Monitoring Suppl (TRUE METRIX METER) w/Device KIT Check blood sugar 1 time daily- E11.2   Cholecalciferol (VITAMIN D-3) 5000 UNITS TABS Take 1 tablet by mouth daily.   Dulaglutide (TRULICITY)  3 MG/0.5ML SOPN Inject 3 mg as directed once a week.   ezetimibe (ZETIA) 10 MG tablet Take 1 tab daily for cholesterol.   ferrous sulfate 325 (65 FE) MG EC tablet Take 325 mg by mouth 3 (three) times daily with meals. Takes Mondays, Wednesdays, Fidays   gabapentin (NEURONTIN) 100 MG capsule TAKE 1 CAPSULE THREE TIMES DAILY FOR NEUROPATHY PAINS IN HANDS   glimepiride (AMARYL) 4 MG tablet TAKE 1 TABLET TWICE DAILY WITH MEALS FOR DIABETES   glucose blood (TRUE METRIX BLOOD GLUCOSE TEST) test strip  CHECK BLOOD SUGAR 1 TIME DAILY   hydrochlorothiazide (HYDRODIURIL) 25 MG tablet TAKE 1 TABLET DAILY FOR BLOOD PRESSURE AND FLUID   losartan (COZAAR) 100 MG tablet TAKE 1 TABLET EVERY DAY FOR BLOOD PRESSURE AND DIABETIC KIDNEY PROTECTION   Magnesium-Zinc (MAGNESIUM-CHELATED ZINC PO) Take 400 mg by mouth daily. Takes 1200 mg daily.   metFORMIN (GLUCOPHAGE-XR) 500 MG 24 hr tablet TAKE 2 TABLETS TWICE DAILY WITH MEALS FOR DIABETES   Multiple Vitamins-Minerals (THERAGRAN-M PREMIER 50 PLUS PO) Take 1 tablet by mouth daily.   Omega-3 Fatty Acids (FISH OIL) 500 MG CAPS Take by mouth daily.   phentermine (ADIPEX-P) 37.5 MG tablet Take  1 tablet  every Morning  for Dieting & Weight Loss   tadalafil (CIALIS) 20 MG tablet TAKE ONE-HALF TO ONE TABLET BY MOUTH EVERY 2 TO 3 DAYS AS NEEDED   TRUEplus Lancets 30G MISC CHECK BLOOD SUGAR 1 TIME DAILY   No current facility-administered medications on file prior to visit.    ROS: all negative except what is noted in the HPI.   Physical Exam:  BP 124/70   Pulse 96   Temp (!) 97 F (36.1 C)   Wt 273 lb 3.2 oz (123.9 kg)   SpO2 96%   BMI 40.94 kg/m   General Appearance: NAD.  Awake, conversant and cooperative. Eyes: PERRLA, EOMs intact.  Sclera white.  Conjunctiva without erythema. Sinuses: No frontal/maxillary tenderness.  No nasal discharge. Nares patent.  ENT/Mouth: Ext aud canals clear.  Bilateral TMs w/DOL and without erythema or bulging. Hearing intact.  Posterior pharynx without swelling or exudate.  Tonsils without swelling or erythema.  Neck: Supple.  No masses, nodules or thyromegaly. Respiratory: Effort is regular with non-labored breathing. Breath sounds are equal bilaterally without rales, rhonchi, wheezing or stridor.  Cardio: RRR with no MRGs. Brisk peripheral pulses without edema.  Abdomen: Active BS in all four quadrants.  Soft and non-tender without guarding, rebound tenderness, hernias or masses. Lymphatics: Non tender without  lymphadenopathy.  Musculoskeletal: LROM in right knee d/t pain.  Moderate edema most notably over medial patellar region.  Tender to palpation.  Antalgic gait.  Favors right leg upon ambulation.   Skin: Appropriate color for ethnicity. Warm without rashes, lesions, ecchymosis, ulcers.  Neuro: CN II-XII grossly normal. Normal muscle tone without cerebellar symptoms and intact sensation.   Psych: AO X 3,  appropriate mood and affect, insight and judgment.     Darrol Jump, NP 9:22 AM [facility]

## 2022-01-02 NOTE — Patient Instructions (Signed)

## 2022-01-07 IMAGING — US US AORTA
1 series · 14 of 25 positions shown · non-contrast
Comparison: Ultrasound abdominal aorta 02/23/2014

CLINICAL DATA: Follow-up bilateral iliac artery dilation.

EXAM:
ULTRASOUND OF ABDOMINAL AORTA
TECHNIQUE: Ultrasound examination of the abdominal aorta and proximal common
iliac arteries was performed to evaluate for aneurysm. Additional
color and Doppler images of the distal aorta were obtained to
document patency.

[Series 1: us aorta · 0.30mm/px · 14 of 30 slices shown]
[im 1/30]
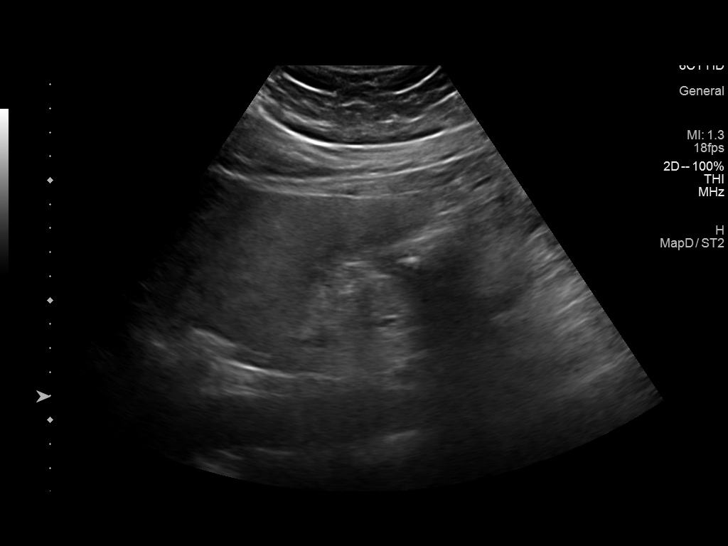
[im 3/30]
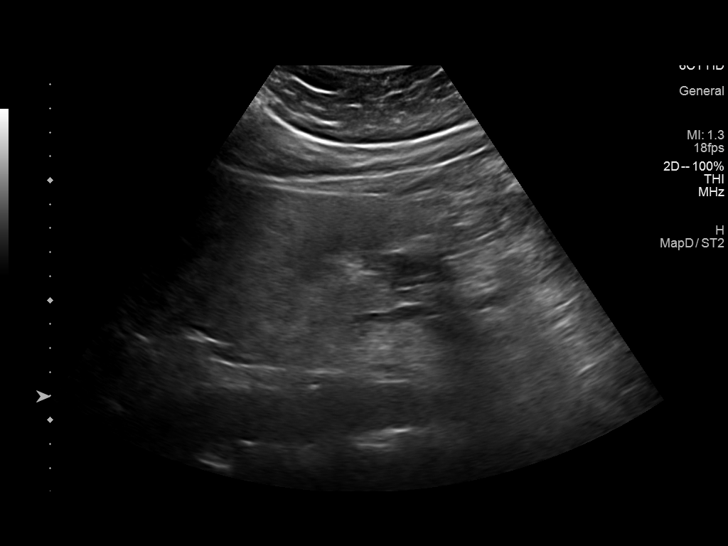
[im 5/30]
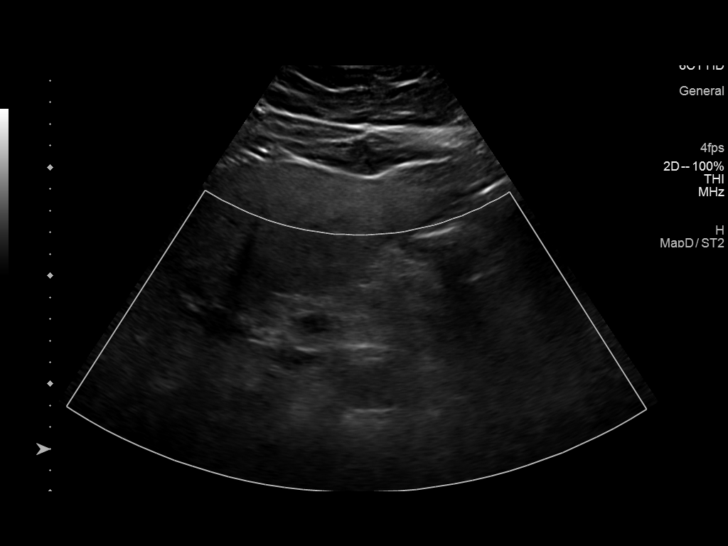
[im 8/30]
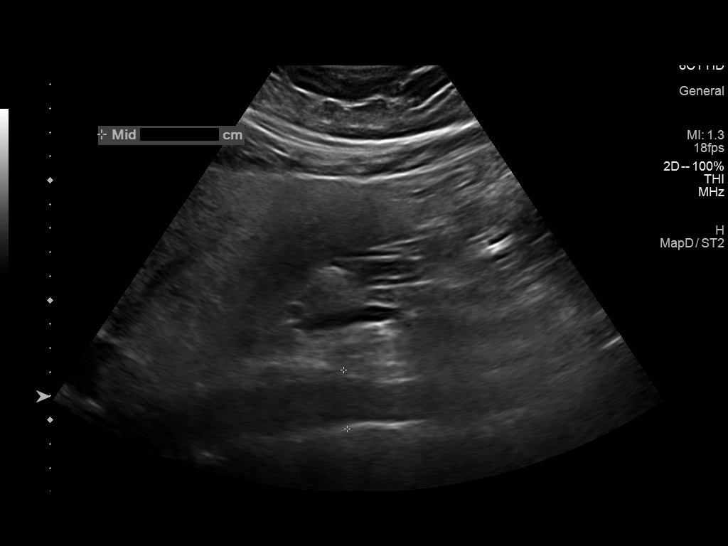
[im 10/30]
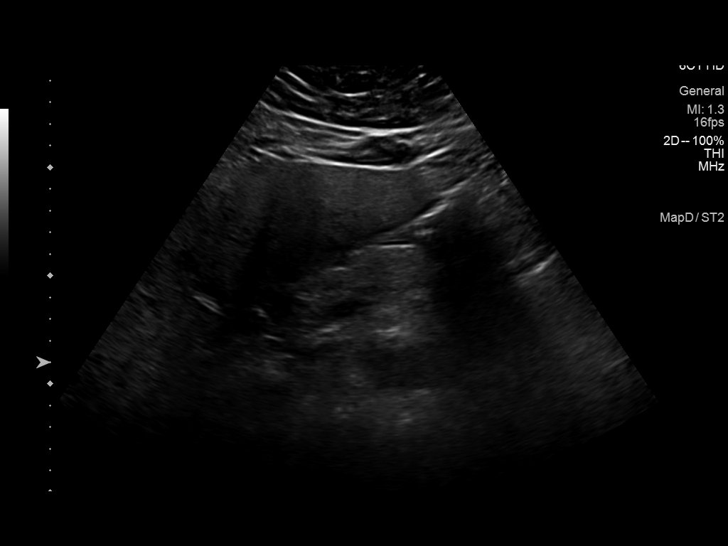
[im 11/30]
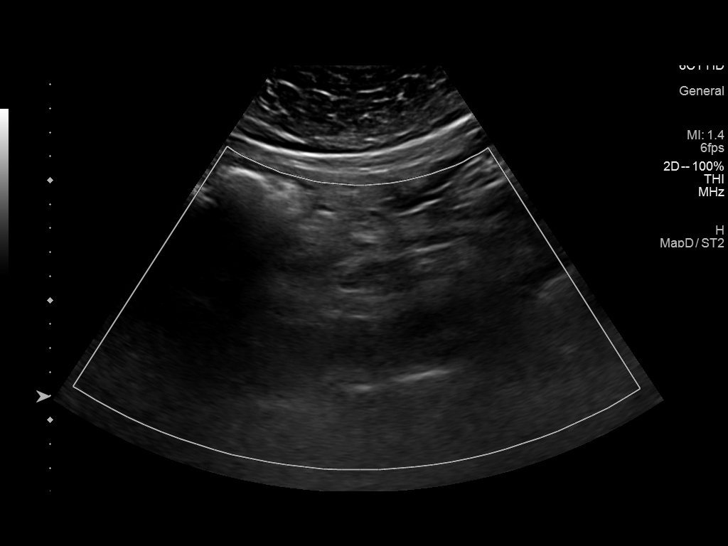
[im 14/30]
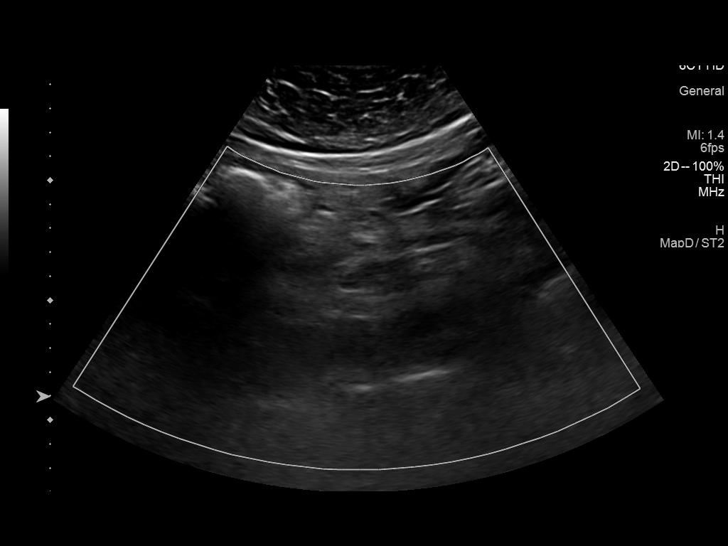
[im 16/30]
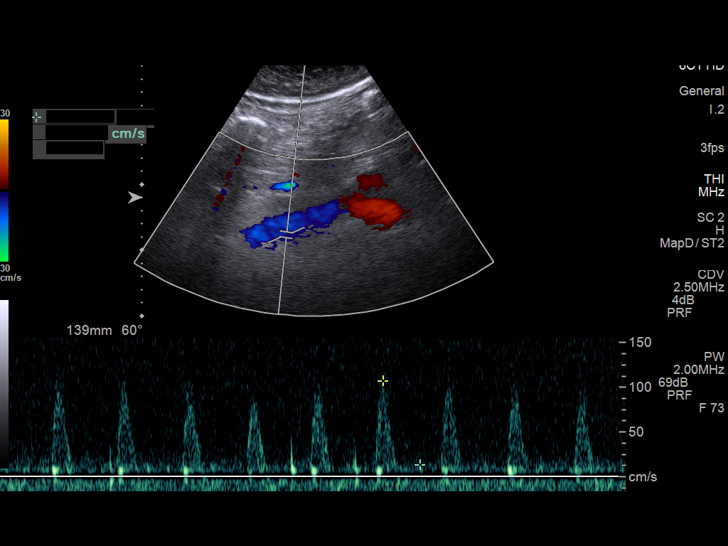
[im 19/30]
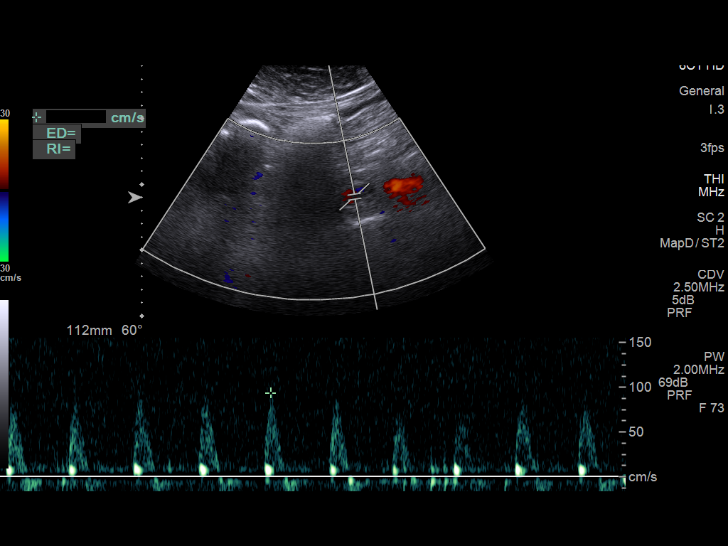
[im 20/30]
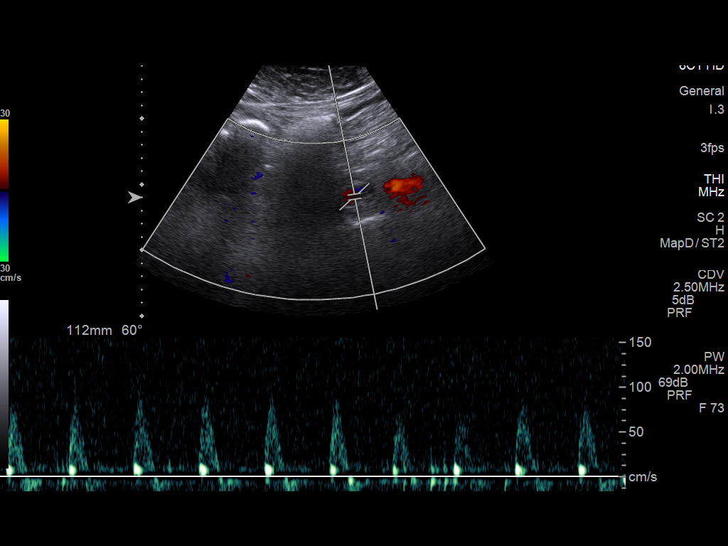
[im 22/30]
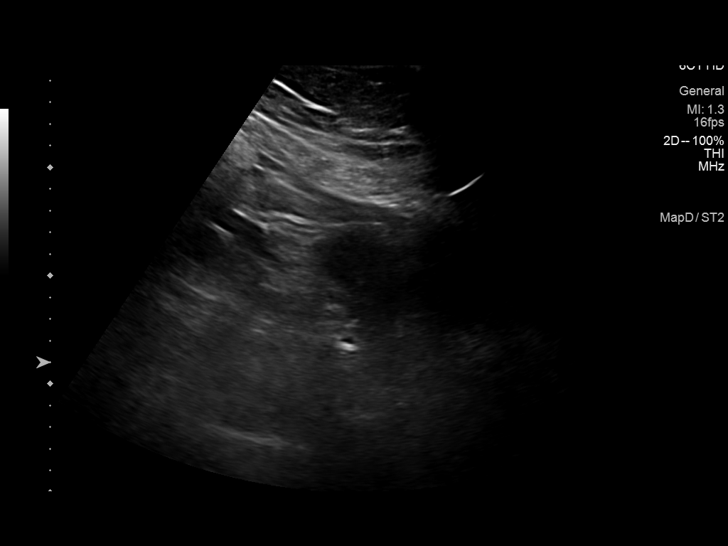
[im 25/30]
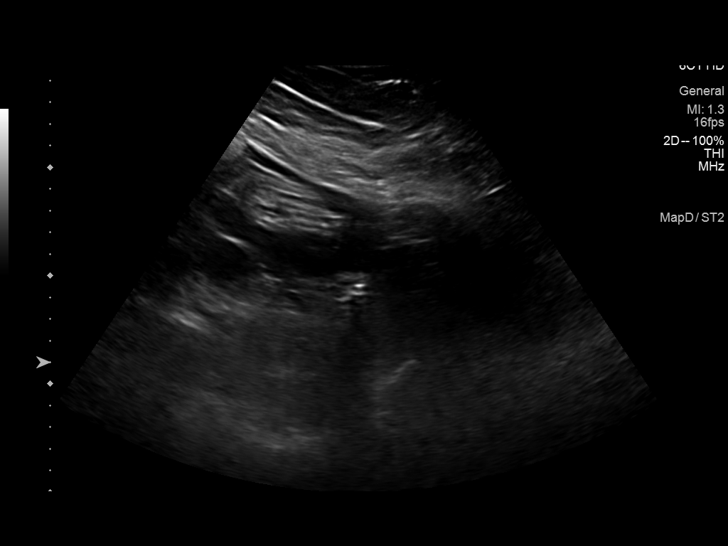
[im 27/30]
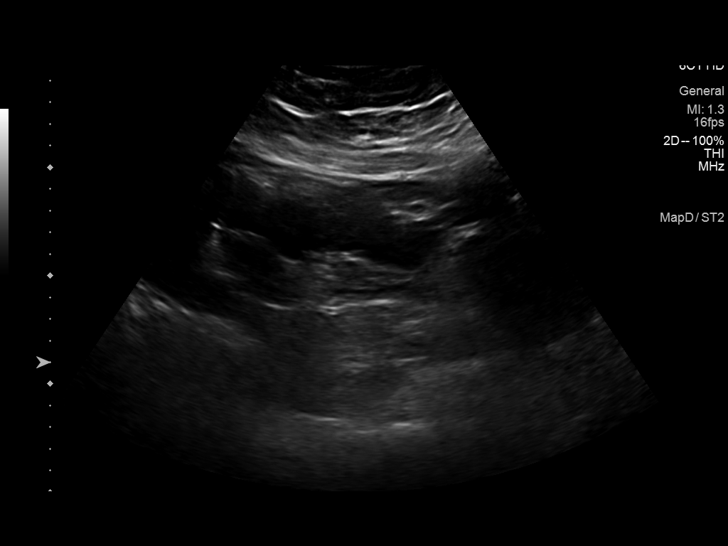
[im 30/30]
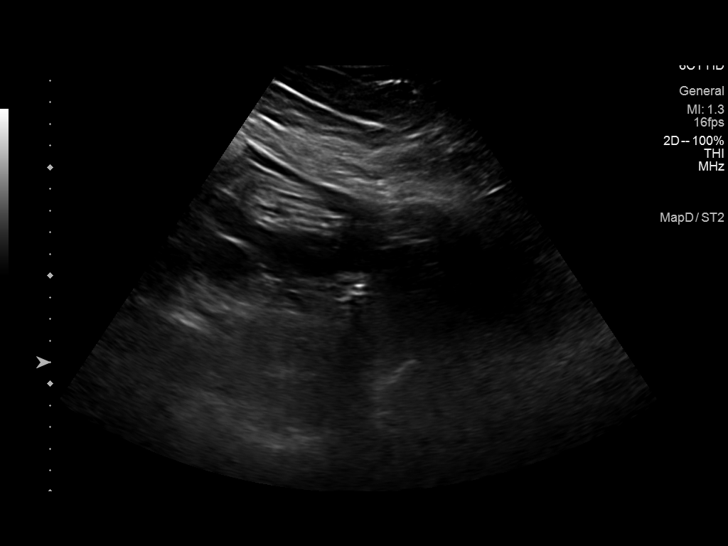

[14 of 25 positions shown; findings below may reference images not displayed]

FINDINGS: Abdominal aortic measurements as follows:

Proximal:  3.4 x 3.0 cm

Mid:  2.5 x 3.0 cm

Distal:  2.8 x 3.3 cm
Patent: Yes, peak systolic velocity is 107 cm/s

Right common iliac artery: 2.2 x 2.3 cm

Left common iliac artery: 2.3 x 2.4 cm
IMPRESSION: Interval progression of aneurysmal dilatation of the common iliac
arteries which now measure 2.2 x 2.3 cm on the right and 2.3 x
cm on the left compared to 1.3 and 1.4 cm respectively on prior
examination. Consider further evaluation with CT angiography of the
abdomen and pelvis to more accurately determine the caliber of the
iliac arteries.

## 2022-01-08 NOTE — Progress Notes (Signed)
MEDICARE ANNUAL WELLNESS VISIT AND FOLLOW UP   Assessment:   [name] [name] was seen today for follow-up and medicare wellness.  Diagnoses and all orders for this visit:  Encounter for Medicare annual wellness exam Yearly  Essential hypertension Continue medication: Cozaar 148m, HCTZ 364mdaily Monitor blood pressure at home; call if consistently over 130/80 Continue DASH diet.   Reminder to go to the ER if any CP, SOB, nausea, dizziness, severe HA, changes vision/speech, left arm numbness and tingling and jaw pain. Check CMP  Hyperlipidemia, mixed associated with Type 2 Diabetes Mellitus/Statin Myopathy    Could not tolerate Simvastatin     Tolerating Zetia      Continue diet and exercise  Lipid Panel, CMP  Type 2 diabetes mellitus with stage 2 chronic kidney disease, without long-term current use of insulin (HCC) Taking Metformin 50042mwo tablets twice a day Continue Trulicity 3 mg SQ QW Not checking blood glucose, discussed this at length with patien       Discussed dietary and exercise modifications  A1c  Vitamin D deficiency Continue supplementation 5,000IU daily  Gastroesophageal reflux disease, unspecified whether esophagitis present Doing well Monitoring diet and avoiding triggers Increase water intake  Morbid obesity with BMI of 40.0-44.9, adult ([place]CC[place]anchesteriscussed dietary and exercise modifications at length with patient. Continue Phentermine and Trulicity  CKD stage 2 due to type 2 diabetes mellitus (HCC) Continue glucose monitoring Continue medications Increase fluids Avoid NSAIDS Discussed dietary modifications and exercise Will continue to monitor  Erectile dysfunction associated with type 2 diabetes mellitus (HCC) Continue Cialis 20 mg 1/2-1 tab every 2-3 days as needed Discussed PRN use, was taking daily without desired effect Discussed medication and side effects  Aneurysm artery, iliac common (HCC) Control blood pressure, cholesterol, glucose,  increase exercise.   Medication management Continued     Further disposition pending results if labs check today. Discussed med's effects and SE's.   Over 30 minutes of face to face interview, exam, counseling, chart review, and critical decision making was performed.    Future Appointments  Date Time Provider [place]/16/2023 10:30 AM McKUnk PintoD [facility] None  09/30/2022 11:00 AM McKUnk PintoD [facility] None     Plan:   During the course of the visit the patient was educated and counseled about appropriate screening and preventive services including:   Pneumococcal vaccine  Influenza vaccine Prevnar 13 Td vaccine Screening electrocardiogram Colorectal cancer screening Diabetes screening Glaucoma screening Nutrition counseling    Subjective:  [name] [name] a 66 78o. AAM who presents for Medicare Annual Wellness Visit and 3 month follow up for HTN, HLD, DMII with CKD, and vitamin D Def. He also has obstructive sleep apnea, does not use CPAP.  He is on disability for bilateral carpel tunnel syndrome. He has retired from [employer] is Body mass index is 42.33 kg/m., he is currently working on diet and exercise, would like to get down to 185 lb. He is going to restart phentermine.  Takes 1/2 tab daily and does decrease appetite. He mows 3-4 yards a week with push mower Wt Readings from Last 3 Encounters:  01/10/22 278 lb 6.4 oz (126.3 kg)  01/02/22 273 lb 3.2 oz (123.9 kg)  01/01/22 223 lb (101.2 kg)   His HTN predates 2007.  He has not recently been checking BPs at home, today their BP is BP: 130/80  BP Readings from Last 3 Encounters:  01/10/22 130/80  01/02/22 124/70  01/01/22 (!) 143/95  He does workout, cutting grass and is starting at the [facility].  He denies chest pain, shortness of breath, dizziness.     He is not on cholesterol medication (rosuvastatin but had myalgias, could not tolerate simvastatin or pravastatin) He is  currently Zetia.  He continues to take Fish oil daily. His cholesterol is not at goal. The cholesterol last visit was:   Lab Results  Component Value Date   CHOL 205 (H) 09/24/2021   HDL 45 09/24/2021   LDLCALC 137 (H) 09/24/2021   TRIG 112 09/24/2021   CHOLHDL 4.6 09/24/2021   He has been working on diet and exercise for Diabetes controlled by metformin with diabetic chronic kidney disease and with diabetic polyneuropathy, he is on gabapentin which is helping, he is on bASA, he is on ACE/ARB, and denies polydipsia, polyuria and visual disturbances. He has not been check fasting glucose. His meter broke last month.  It had been running 90's Lab Results  Component Value Date   HGBA1C 6.6 (H) 09/24/2021   Last GFR Lab Results  Component Value Date   GFRAA 79 12/17/2020   Patient is on Vitamin D supplement.  Last visit he was at goal. Lab Results  Component Value Date   VD25OH 80 09/24/2021        Medication Review: Current Outpatient Medications on File Prior to Visit  Medication Sig Dispense Refill   Alcohol Swabs (DROPSAFE ALCOHOL PREP) 70 % PADS USE TO CHECK BLOOD SUGAR 1 TIME DAILY 100 each 3   Ascorbic Acid (VITA-C PO) Take by mouth. 3 times a week     aspirin EC 81 MG tablet Take 81 mg by mouth daily.     Blood Glucose Calibration (TRUE METRIX LEVEL 1) Low SOLN Use with glucometer. Check blood sugar 1 time daily-E11.2. 1 each 3   Blood Glucose Monitoring Suppl (TRUE METRIX METER) w/Device KIT Check blood sugar 1 time daily- E11.2 1 kit 0   Cholecalciferol (VITAMIN D-3) 5000 UNITS TABS Take 1 tablet by mouth daily.     Dulaglutide (TRULICITY) 3 BW/6.2MB SOPN Inject 3 mg as directed once a week. 6 mL 3   ezetimibe (ZETIA) 10 MG tablet Take 1 tab daily for cholesterol. 90 tablet 3   ferrous sulfate 325 (65 FE) MG EC tablet Take 325 mg by mouth 3 (three) times daily with meals. Takes Mondays, Wednesdays, Fidays     gabapentin (NEURONTIN) 100 MG capsule TAKE 1 CAPSULE THREE  TIMES DAILY FOR NEUROPATHY PAINS IN HANDS 270 capsule 3   glimepiride (AMARYL) 4 MG tablet TAKE 1 TABLET TWICE DAILY WITH MEALS FOR DIABETES 180 tablet 0   glucose blood (TRUE METRIX BLOOD GLUCOSE TEST) test strip CHECK BLOOD SUGAR 1 TIME DAILY 100 strip 3   hydrochlorothiazide (HYDRODIURIL) 25 MG tablet TAKE 1 TABLET DAILY FOR BLOOD PRESSURE AND FLUID 90 tablet 3   losartan (COZAAR) 100 MG tablet TAKE 1 TABLET EVERY DAY FOR BLOOD PRESSURE AND DIABETIC KIDNEY PROTECTION 90 tablet 3   Magnesium-Zinc (MAGNESIUM-CHELATED ZINC PO) Take 400 mg by mouth daily. Takes 1200 mg daily.     metFORMIN (GLUCOPHAGE-XR) 500 MG 24 hr tablet TAKE 2 TABLETS TWICE DAILY WITH MEALS FOR DIABETES 360 tablet 3   Multiple Vitamins-Minerals (THERAGRAN-M PREMIER 50 PLUS PO) Take 1 tablet by mouth daily.     Omega-3 Fatty Acids (FISH OIL) 500 MG CAPS Take by mouth daily.     phentermine (ADIPEX-P) 37.5 MG tablet Take  1 tablet  every Morning  for  Dieting & Weight Loss 90 tablet 1   tadalafil (CIALIS) 20 MG tablet TAKE ONE-HALF TO ONE TABLET BY MOUTH EVERY 2 TO 3 DAYS AS NEEDED 30 tablet 2   TRUEplus Lancets 30G MISC CHECK BLOOD SUGAR 1 TIME DAILY 100 each 3   No current facility-administered medications on file prior to visit.    Allergies: Allergies  Allergen Reactions   Crestor [Rosuvastatin Calcium] Other (See Comments)    Cramping   Simvastatin Other (See Comments)    Muscle cramps    Current Problems (verified) has HTN (hypertension); Hyperlipidemia associated with type 2 diabetes mellitus ([place]); GERD (gastroesophageal reflux disease); Morbid obesity (BMI 40+); Vitamin D deficiency; T2_NIDDM w/ Stage 2 CKD ([place]); Medication management; Aneurysm artery, iliac common ([place]); Chronic pain syndrome; Peripheral sensory neuropathy due to T2_NIDDM; Erectile dysfunction associated with type 2 diabetes mellitus ([place]); Chronic lower back pain; Gluttony; Arthritis of right knee; CKD stage 2 due to type 2 diabetes mellitus  ([place]); and Statin myopathy on their problem list.  Screening Tests Immunization History  Administered Date(s) Administered   PNEUMOCOCCAL CONJUGATE-20 06/11/2021   Pneumococcal-Unspecified 02/16/2013   Td 06/11/2021   Tdap 02/10/2011   Preventative care: Last colonoscopy: 2013, due 2023 Ct head 11/2014 US aorta 2015   Prior vaccinations: TD or Tdap: 06/11/21  Influenza: Declined  Pneumococcal: 06/11/21 Prevnar13: Declined until 65 Shingles/Zostavax: Declined SARS-COV-Moderna Complete: 04/2020   Names of Other Physician/Practitioners you currently use: 1. [facility] here for primary care 2. Dr. Sherral Hammers, eye doctor, last visit 12/05/20 3. Does not have a dentist- reminded to schedule  Patient Care Team: Unk Pinto, MD as PCP - General (Internal Medicine) Sable Feil, MD as Consulting Physician (Gastroenterology) Monna Fam, MD as Consulting Physician (Ophthalmology) Elsie Saas, MD as Consulting Physician (Orthopedic Surgery) Leeroy Cha, MD as Consulting Physician (Neurosurgery) Gean Birchwood, DPM (Inactive) as Consulting Physician (Podiatry)  Surgical: He  has a past surgical history that includes Carpal tunnel release (2008); Knee arthroscopy (2010); and Cervical discectomy (2003). Family His family history includes Diabetes in his father, mother, and sister; Heart disease in his mother; Hypertension in his father; Kidney disease in his sister; Stroke in his mother. Social history  He reports that he quit smoking about 30 years ago. His smoking use included cigarettes. He has never used smokeless tobacco. He reports that he does not currently use alcohol. He reports that he does not use drugs.  MEDICARE WELLNESS OBJECTIVES: Physical activity: Current Exercise Habits: Home exercise routine, Time (Minutes): 40, Frequency (Times/Week): 3, Weekly Exercise (Minutes/Week): 120, Intensity: Mild, Exercise limited  by: orthopedic condition(s) Cardiac risk factors: Cardiac Risk Factors include: diabetes mellitus;dyslipidemia;male gender;hypertension;obesity (BMI >30kg/m2);sedentary lifestyle Depression/mood screen:      01/10/2022   10:55 AM  Depression screen PHQ 2/9  Decreased Interest 0  Down, Depressed, Hopeless 0  PHQ - 2 Score 0    ADLs:     01/10/2022   10:55 AM 09/23/2021   11:22 PM  In your present state of health, do you have any difficulty performing the following activities:  Hearing? 0 0  Vision? 0 0  Difficulty concentrating or making decisions? 0 0  Walking or climbing stairs? 0 0  Dressing or bathing? 0 0  Doing errands, shopping? 0 0     Cognitive Testing  Alert? Yes  Normal Appearance?Yes  Oriented to person? Yes  Place? Yes   Time? Yes  Recall of three objects?  Yes  Can perform simple calculations?  Yes  Displays appropriate judgment?Yes  Can read the correct time from a watch face?Yes  EOL planning: Does Patient Have a Medical Advance Directive?: No Would patient like information on creating a medical advance directive?: Yes (MAU/Ambulatory/Procedural Areas - Information given)   Do you have any new medications? Current Outpatient Medications on File Prior to Visit  Medication Sig   Alcohol Swabs (DROPSAFE ALCOHOL PREP) 70 % PADS USE TO CHECK BLOOD SUGAR 1 TIME DAILY   Ascorbic Acid (VITA-C PO) Take by mouth. 3 times a week   aspirin EC 81 MG tablet Take 81 mg by mouth daily.   Blood Glucose Calibration (TRUE METRIX LEVEL 1) Low SOLN Use with glucometer. Check blood sugar 1 time daily-E11.2.   Blood Glucose Monitoring Suppl (TRUE METRIX METER) w/Device KIT Check blood sugar 1 time daily- E11.2   Cholecalciferol (VITAMIN D-3) 5000 UNITS TABS Take 1 tablet by mouth daily.   Dulaglutide (TRULICITY) 3 EN/2.7PO SOPN Inject 3 mg as directed once a week.   ezetimibe (ZETIA) 10 MG tablet Take 1 tab daily for cholesterol.   ferrous sulfate 325 (65 FE) MG EC tablet Take 325  mg by mouth 3 (three) times daily with meals. Takes Mondays, Wednesdays, Fidays   gabapentin (NEURONTIN) 100 MG capsule TAKE 1 CAPSULE THREE TIMES DAILY FOR NEUROPATHY PAINS IN HANDS   glimepiride (AMARYL) 4 MG tablet TAKE 1 TABLET TWICE DAILY WITH MEALS FOR DIABETES   glucose blood (TRUE METRIX BLOOD GLUCOSE TEST) test strip CHECK BLOOD SUGAR 1 TIME DAILY   hydrochlorothiazide (HYDRODIURIL) 25 MG tablet TAKE 1 TABLET DAILY FOR BLOOD PRESSURE AND FLUID   losartan (COZAAR) 100 MG tablet TAKE 1 TABLET EVERY DAY FOR BLOOD PRESSURE AND DIABETIC KIDNEY PROTECTION   Magnesium-Zinc (MAGNESIUM-CHELATED ZINC PO) Take 400 mg by mouth daily. Takes 1200 mg daily.   metFORMIN (GLUCOPHAGE-XR) 500 MG 24 hr tablet TAKE 2 TABLETS TWICE DAILY WITH MEALS FOR DIABETES   Multiple Vitamins-Minerals (THERAGRAN-M PREMIER 50 PLUS PO) Take 1 tablet by mouth daily.   Omega-3 Fatty Acids (FISH OIL) 500 MG CAPS Take by mouth daily.   phentermine (ADIPEX-P) 37.5 MG tablet Take  1 tablet  every Morning  for Dieting & Weight Loss   tadalafil (CIALIS) 20 MG tablet TAKE ONE-HALF TO ONE TABLET BY MOUTH EVERY 2 TO 3 DAYS AS NEEDED   TRUEplus Lancets 30G MISC CHECK BLOOD SUGAR 1 TIME DAILY   No current facility-administered medications on file prior to visit.     Objective:   Today's Vitals   01/10/22 1044  BP: 130/80  Pulse: 94  Resp: 16  Temp: 97.9 F (36.6 C)  SpO2: 94%  Weight: 278 lb 6.4 oz (126.3 kg)  Height: _0  (1.727 m)     Body mass index is 42.33 kg/m.  General Appearance: Morbidly obese, in no apparent distress. Eyes: PERRLA, EOMs, conjunctiva no swelling or erythema Sinuses: No Frontal/maxillary tenderness ENT/Mouth: Ext aud canals clear, TMs without erythema, bulging. No erythema, swelling, or exudate on post pharynx.  Tonsils not swollen or erythematous. Hearing normal.  Neck: Supple, thyroid normal.  Respiratory: Respiratory effort normal, BS equal bilaterally without rales, rhonchi, wheezing  or stridor.  Cardio: RRR with no MRGs. Diminished peripheral pulses without edema.  Abdomen: Soft, obese, + BS.  Non tender, no guarding, rebound, hernias, masses. Lymphatics: Non tender without lymphadenopathy.  Musculoskeletal: Full ROM except right knee,wearing brace.  Normal gait Skin: Warm, dry without rashes, lesions, ecchymosis.  Neuro: Cranial nerves intact. No cerebellar  symptoms.  Psych: Awake and oriented X 3, normal affect, Insight and Judgment appropriate.   Medicare Attestation I have personally reviewed:  The patient's medical and social history Their use of alcohol, tobacco or illicit drugs Their current medications and supplements The patient's functional ability including ADLs,fall risks, home safety risks, cognitive, and hearing and visual impairment Diet and physical activities Evidence for depression or mood disorders  The patient's weight, height, BMI, and visual acuity have been recorded in the chart.  I have made referrals, counseling, and provided education to the patient based on review of the above and I have provided the patient with a written personalized care plan for preventive services.     Magda Bernheim ANP-C  [facility]  01/10/2022

## 2022-01-10 ENCOUNTER — Ambulatory Visit (INDEPENDENT_AMBULATORY_CARE_PROVIDER_SITE_OTHER): Payer: Medicare HMO | Admitting: Nurse Practitioner

## 2022-01-10 ENCOUNTER — Encounter: Payer: Self-pay | Admitting: Nurse Practitioner

## 2022-01-10 VITALS — BP 130/80 | HR 94 | Temp 97.9°F | Resp 16 | Ht 68.0 in | Wt 278.4 lb

## 2022-01-10 DIAGNOSIS — Z79899 Other long term (current) drug therapy: Secondary | ICD-10-CM

## 2022-01-10 DIAGNOSIS — E785 Hyperlipidemia, unspecified: Secondary | ICD-10-CM | POA: Diagnosis not present

## 2022-01-10 DIAGNOSIS — R6889 Other general symptoms and signs: Secondary | ICD-10-CM | POA: Diagnosis not present

## 2022-01-10 DIAGNOSIS — N182 Chronic kidney disease, stage 2 (mild): Secondary | ICD-10-CM

## 2022-01-10 DIAGNOSIS — I10 Essential (primary) hypertension: Secondary | ICD-10-CM

## 2022-01-10 DIAGNOSIS — Z0001 Encounter for general adult medical examination with abnormal findings: Secondary | ICD-10-CM | POA: Diagnosis not present

## 2022-01-10 DIAGNOSIS — G720 Drug-induced myopathy: Secondary | ICD-10-CM

## 2022-01-10 DIAGNOSIS — K2100 Gastro-esophageal reflux disease with esophagitis, without bleeding: Secondary | ICD-10-CM

## 2022-01-10 DIAGNOSIS — I723 Aneurysm of iliac artery: Secondary | ICD-10-CM

## 2022-01-10 DIAGNOSIS — Z0000 Encounter for general adult medical examination without abnormal findings: Secondary | ICD-10-CM

## 2022-01-10 DIAGNOSIS — E559 Vitamin D deficiency, unspecified: Secondary | ICD-10-CM

## 2022-01-10 DIAGNOSIS — N521 Erectile dysfunction due to diseases classified elsewhere: Secondary | ICD-10-CM

## 2022-01-10 DIAGNOSIS — E1169 Type 2 diabetes mellitus with other specified complication: Secondary | ICD-10-CM

## 2022-01-10 DIAGNOSIS — E1122 Type 2 diabetes mellitus with diabetic chronic kidney disease: Secondary | ICD-10-CM | POA: Diagnosis not present

## 2022-01-10 MED ORDER — TRULICITY 3 MG/0.5ML ~~LOC~~ SOAJ: 3.0000 mg | SUBCUTANEOUS | 3 refills | Status: DC

## 2022-01-11 LAB — COMPLETE METABOLIC PANEL WITH GFR
AG Ratio: 1.3 (calc) (ref 1.0–2.5)
ALT: 7 U/L — ABNORMAL LOW (ref 9–46)
AST: 15 U/L (ref 10–35)
Albumin: 4.2 g/dL (ref 3.6–5.1)
Alkaline phosphatase (APISO): 67 U/L (ref 35–144)
BUN: 25 mg/dL (ref 7–25)
CO2: 25 mmol/L (ref 20–32)
Calcium: 9.5 mg/dL (ref 8.6–10.3)
Chloride: 102 mmol/L (ref 98–110)
Creat: 1.1 mg/dL (ref 0.70–1.35)
Globulin: 3.3 g/dL (calc) (ref 1.9–3.7)
Glucose, Bld: 67 mg/dL (ref 65–99)
Potassium: 4.2 mmol/L (ref 3.5–5.3)
Sodium: 137 mmol/L (ref 135–146)
Total Bilirubin: 0.3 mg/dL (ref 0.2–1.2)
Total Protein: 7.5 g/dL (ref 6.1–8.1)
eGFR: 74 mL/min/{1.73_m2} (ref >=60)

## 2022-01-11 LAB — CBC WITH DIFFERENTIAL/PLATELET
Absolute Monocytes: 723 cells/uL (ref 200–950)
Basophils Absolute: 38 cells/uL (ref 0–200)
Basophils Relative: 0.6 %
Eosinophils Absolute: 160 cells/uL (ref 15–500)
Eosinophils Relative: 2.5 %
HCT: 39.4 % (ref 38.5–50.0)
Hemoglobin: 12.9 g/dL — ABNORMAL LOW (ref 13.2–17.1)
Lymphs Abs: 2246 cells/uL (ref 850–3900)
MCH: 27.2 pg (ref 27.0–33.0)
MCHC: 32.7 g/dL (ref 32.0–36.0)
MCV: 82.9 fL (ref 80.0–100.0)
MPV: 9.7 fL (ref 7.5–12.5)
Monocytes Relative: 11.3 %
Neutro Abs: 3232 cells/uL (ref 1500–7800)
Neutrophils Relative %: 50.5 %
Platelets: 399 10*3/uL (ref 140–400)
RBC: 4.75 10*6/uL (ref 4.20–5.80)
RDW: 12.8 % (ref 11.0–15.0)
Total Lymphocyte: 35.1 %
WBC: 6.4 10*3/uL (ref 3.8–10.8)

## 2022-01-11 LAB — HEMOGLOBIN A1C
Hgb A1c MFr Bld: 6.1 % of total Hgb — ABNORMAL HIGH (ref <5.7)
Mean Plasma Glucose: 128 mg/dL
eAG (mmol/L): 7.1 mmol/L

## 2022-01-11 LAB — LIPID PANEL
Cholesterol: 175 mg/dL (ref <200)
HDL: 48 mg/dL (ref >=40)
LDL Cholesterol (Calc): 109 mg/dL (calc) — ABNORMAL HIGH
Non-HDL Cholesterol (Calc): 127 mg/dL (calc) (ref <130)
Total CHOL/HDL Ratio: 3.6 (calc) (ref <5.0)
Triglycerides: 89 mg/dL (ref <150)

## 2022-01-31 ENCOUNTER — Telehealth: Payer: Self-pay | Admitting: Nurse Practitioner

## 2022-01-31 NOTE — Telephone Encounter (Signed)
Since starting Zetia for Chol he started getting muscle spasms which he never had before. Read that this is a side effects so he is not wanting to continue taking this medication.

## 2022-02-07 ENCOUNTER — Telehealth: Payer: Self-pay | Admitting: Nurse Practitioner

## 2022-03-01 ENCOUNTER — Other Ambulatory Visit: Payer: Self-pay | Admitting: Adult Health

## 2022-03-02 ENCOUNTER — Other Ambulatory Visit: Payer: Self-pay | Admitting: Internal Medicine

## 2022-03-02 MED ORDER — TADALAFIL 20 MG PO TABS: ORAL_TABLET | ORAL | 2 refills | Status: DC

## 2022-04-02 ENCOUNTER — Ambulatory Visit: Payer: Medicare HMO | Admitting: Internal Medicine

## 2022-04-15 ENCOUNTER — Other Ambulatory Visit: Payer: Self-pay

## 2022-04-15 DIAGNOSIS — E1122 Type 2 diabetes mellitus with diabetic chronic kidney disease: Secondary | ICD-10-CM

## 2022-04-15 MED ORDER — TRUE METRIX BLOOD GLUCOSE TEST VI STRP: ORAL_STRIP | 3 refills | Status: AC

## 2022-04-15 MED ORDER — TRUEPLUS LANCETS 30G MISC: 3 refills | Status: AC

## 2022-05-01 NOTE — Patient Instructions (Signed)

## 2022-05-01 NOTE — Progress Notes (Unsigned)
Future Appointments  Date Time Provider Department  05/02/2022 10:30 AM Unk Pinto, MD [facility]  11/14/2022 11:00 AM Unk Pinto, MD [facility]    History of Present Illness:       This very nice 66 y.o. single BM presents for 6 month follow up with HTN, HLD, Pre-Diabetes and Vitamin D Deficiency.        Patient is treated for HTN (2007) & BP has been controlled at home. Today's BP is at goal - 128/78. Patient has had no complaints of any cardiac type chest pain, palpitations, dyspnea / orthopnea / PND, dizziness, claudication, or dependent edema.       Hyperlipidemia is not controlled with diet & meds (alleges Statin Intolerance). Patient denies myalgias or other med SE's. Last Lipids were not at goal:   Lab Results  Component Value Date   CHOL 216 (H) 12/17/2020   HDL 45 12/17/2020   LDLCALC 148 (H) 12/17/2020   TRIG 111 12/17/2020   CHOLHDL 4.8 12/17/2020     Also, the patient has  Morbid Obesity (BMI 43.1+)  history of T2_NIDDM (2014) w/CKD2 (GFR 68)  and has had no symptoms of reactive hypoglycemia, diabetic polys, paresthesias or visual blurring.  Last A1c was not at goal:  Lab Results  Component Value Date   HGBA1C 8.6 (H) 12/17/2020                                                        Further, the patient also has history of Vitamin D Deficiency and supplements vitamin D without any suspected side-effects. Last vitamin D was at goal:   Lab Results  Component Value Date   VD25OH 62 09/13/2020     Current Outpatient Medications on File Prior to Visit  Medication Sig   Ascorbic Acid (VITA-C PO) Take by mouth. 3 times a week   aspirin EC 81 MG tablet Take 81 mg by mouth daily.   Cholecalciferol (VITAMIN D-3) 5000 UNITS TABS Take 1 tablet by mouth daily.   Dulaglutide (TRULICITY) 3 0000000 SOPN Inject 3 mg as directed once a week.   ezetimibe (ZETIA) 10 MG tablet Take 1 tab daily for cholesterol.   ferrous sulfate 325 (65 FE) MG EC tablet  Take 325 mg by mouth 3 (three) times daily with meals. Takes Mondays, Wednesdays, Fidays   gabapentin (NEURONTIN) 100 MG capsule TAKE 1 CAPSULE THREE TIMES DAILY FOR NEUROPATHY PAINS IN HANDS   glimepiride (AMARYL) 4 MG tablet TAKE 1 TABLET TWICE DAILY WITH MEALS FOR DIABETES   glucose blood (TRUE METRIX BLOOD GLUCOSE TEST) test strip CHECK BLOOD SUGAR 1 TIME DAILY   hydrochlorothiazide (HYDRODIURIL) 25 MG tablet TAKE 1 TABLET DAILY FOR BLOOD PRESSURE AND FLUID   losartan (COZAAR) 100 MG tablet TAKE 1 TABLET EVERY DAY FOR BLOOD PRESSURE AND DIABETIC KIDNEY PROTECTION   Magnesium-Zinc  Take 400 mg by mouth daily. Takes 1200 mg daily.   metFORMIN - XR 500 MG 24 hr tablet TAKE 2 TABLETS TWICE A DAY WITH MEALS FOR DIABETES   THERAGRAN-M PREMIER 50 PLUS  Take 1 tablet by mouth daily.   Omega-3 FISH OIL  500 MG CAPS Take by mouth daily.   phentermine 37.5 MG tablet Take  1 tablet  every Morning  for Dieting & Weight Loss   tadalafil (CIALIS)  20 MG tablet Take  1/2 to 1 tablet  every 2 to 3 days  if needede for XXXX                              /          TAKE                                             BY                  MOUTH   TRUEplus Lancets 30G MISC CHECK BLOOD SUGAR 1 TIME DAILY      Allergies  Allergen Reactions   Crestor [Rosuvastatin Calcium] Other (See Comments)    Cramping     PMHx:   Past Medical History:  Diagnosis Date   Diabetes mellitus without complication (HCC)    Hyperlipidemia, mixed 09/29/2012   Hypertension    Vitamin D deficiency      Immunization History  Administered Date(s) Administered   Pneumococcal-23 02/16/2013   Tdap 02/10/2011     Past Surgical History:  Procedure Laterality Date   CARPAL TUNNEL RELEASE  2008   bilateral   CERVICAL DISCECTOMY  2003   KNEE ARTHROSCOPY  2010   left    FHx:    Reviewed / unchanged  SHx:    Reviewed / unchanged   Systems Review:  Constitutional: Denies fever, chills, wt changes, headaches, insomnia,  fatigue, night sweats, change in appetite. Eyes: Denies redness, blurred vision, diplopia, discharge, itchy, watery eyes.  ENT: Denies discharge, congestion, post nasal drip, epistaxis, sore throat, earache, hearing loss, dental pain, tinnitus, vertigo, sinus pain, snoring.  CV: Denies chest pain, palpitations, irregular heartbeat, syncope, dyspnea, diaphoresis, orthopnea, PND, claudication or edema. Respiratory: denies cough, dyspnea, DOE, pleurisy, hoarseness, laryngitis, wheezing.  Gastrointestinal: Denies dysphagia, odynophagia, heartburn, reflux, water brash, abdominal pain or cramps, nausea, vomiting, bloating, diarrhea, constipation, hematemesis, melena, hematochezia  or hemorrhoids. Genitourinary: Denies dysuria, frequency, urgency, nocturia, hesitancy, discharge, hematuria or flank pain. Musculoskeletal: Denies arthralgias, myalgias, stiffness, jt. swelling, pain, limping or strain/sprain.  Skin: Denies pruritus, rash, hives, warts, acne, eczema or change in skin lesion(s). Neuro: No weakness, tremor, incoordination, spasms, paresthesia or pain. Psychiatric: Denies confusion, memory loss or sensory loss. Endo: Denies change in weight, skin or hair change.  Heme/Lymph: No excessive bleeding, bruising or enlarged lymph nodes.  Physical Exam  There were no vitals taken for this visit.  Appears  well nourished, well groomed  and in no distress.  Eyes: PERRLA, EOMs, conjunctiva no swelling or erythema. Sinuses: No frontal/maxillary tenderness ENT/Mouth: EAC's clear, TM's nl w/o erythema, bulging. Nares clear w/o erythema, swelling, exudates. Oropharynx clear without erythema or exudates. Oral hygiene is good. Tongue normal, non obstructing. Hearing intact.  Neck: Supple. Thyroid not palpable. Car 2+/2+ without bruits, nodes or JVD. Chest: Respirations nl with BS clear & equal w/o rales, rhonchi, wheezing or stridor.  Cor: Heart sounds normal w/ regular rate and rhythm without sig.  murmurs, gallops, clicks or rubs. Peripheral pulses normal and equal  without edema.  Abdomen: Soft & bowel sounds normal. Non-tender w/o guarding, rebound, hernias, masses or organomegaly.  Lymphatics: Unremarkable.  Musculoskeletal: Full ROM all peripheral extremities, joint stability, 5/5 strength and normal gait.  Skin: Warm, dry without exposed rashes, lesions or ecchymosis apparent.  Neuro: Cranial nerves intact, reflexes equal bilaterally. Sensory-motor testing grossly intact. Tendon reflexes grossly intact.  Pysch: Alert & oriented x 3.  Insight and judgement nl & appropriate. No ideations.  Assessment and Plan:  1. Essential hypertension  - Continue medication, monitor blood pressure at home.  - Continue DASH diet.  Reminder to go to the ER if any CP,  SOB, nausea, dizziness, severe HA, changes vision/speech.   - CBC with Differential/Platelet - COMPLETE METABOLIC PANEL WITH GFR - Magnesium - TSH  2. Hyperlipidemia associated with type 2 diabetes mellitus (HCC)  - Continue diet/meds, exercise,& lifestyle modifications.  - Continue monitor periodic cholesterol/liver & renal functions    - Lipid panel - TSH  3. Type 2 diabetes mellitus with stage 2 chronic kidney  disease, without long-term current use of insulin (HCC)  - Hemoglobin A1c - Insulin, random  4. Vitamin D deficiency  - Continue diet, exercise  - Lifestyle modifications.  - Monitor appropriate labs. - Continue supplementation.    5. Poorly controlled diabetes mellitus (HCC)  - Hemoglobin A1c - Insulin, random  6. Peripheral sensory neuropathy due to T2_NIDDM  - Hemoglobin A1c - Insulin, random  7. Morbid obesity with BMI of 40.0-44.9, adult (HCC)  - TSH  8. Medication management  - CBC with Differential/Platelet - COMPLETE METABOLIC PANEL WITH GFR - Magnesium - Lipid panel - TSH - Hemoglobin A1c - Insulin, random        Discussed  regular exercise, BP monitoring, weight control to  achieve/maintain BMI less than 25 and discussed med and SE's. Recommended labs to assess and monitor clinical status with further disposition pending results of labs.  I discussed the assessment and treatment plan with the patient. The patient was provided an opportunity to ask questions and all were answered. The patient agreed with the plan and demonstrated an understanding of the instructions.  I provided over 30 minutes of exam, counseling, chart review and  complex critical decision making.        The patient was advised to call back or seek an in-person evaluation if the symptoms worsen or if the condition fails to improve as anticipated.   Marinus Maw, MD

## 2022-05-02 ENCOUNTER — Ambulatory Visit (INDEPENDENT_AMBULATORY_CARE_PROVIDER_SITE_OTHER): Payer: Medicare HMO | Admitting: Internal Medicine

## 2022-05-02 ENCOUNTER — Encounter: Payer: Self-pay | Admitting: Internal Medicine

## 2022-05-02 DIAGNOSIS — E559 Vitamin D deficiency, unspecified: Secondary | ICD-10-CM | POA: Diagnosis not present

## 2022-05-02 DIAGNOSIS — E1142 Type 2 diabetes mellitus with diabetic polyneuropathy: Secondary | ICD-10-CM

## 2022-05-02 DIAGNOSIS — E1165 Type 2 diabetes mellitus with hyperglycemia: Secondary | ICD-10-CM

## 2022-05-02 DIAGNOSIS — I10 Essential (primary) hypertension: Secondary | ICD-10-CM | POA: Diagnosis not present

## 2022-05-02 DIAGNOSIS — E1169 Type 2 diabetes mellitus with other specified complication: Secondary | ICD-10-CM

## 2022-05-02 DIAGNOSIS — E1122 Type 2 diabetes mellitus with diabetic chronic kidney disease: Secondary | ICD-10-CM

## 2022-05-02 DIAGNOSIS — N182 Chronic kidney disease, stage 2 (mild): Secondary | ICD-10-CM

## 2022-05-02 DIAGNOSIS — Z6841 Body Mass Index (BMI) 40.0 and over, adult: Secondary | ICD-10-CM

## 2022-05-02 DIAGNOSIS — Z79899 Other long term (current) drug therapy: Secondary | ICD-10-CM

## 2022-05-02 DIAGNOSIS — E785 Hyperlipidemia, unspecified: Secondary | ICD-10-CM | POA: Diagnosis not present

## 2022-05-03 ENCOUNTER — Encounter: Payer: Self-pay | Admitting: Internal Medicine

## 2022-05-03 NOTE — Progress Notes (Signed)
<><><><><><><><><><><><><><><><><><><><><><><><><><><><><><><><><> <><><><><><><><><><><><><><><><><><><><><><><><><><><><><><><><><>  - A1c Better - Down to 6.0%  - Wonderful  - &  with continued weight loss should eventually be able to                                                                 stop ALL  Diabetic meds & "cure " Diabetes  !  <><><><><><><><><><><><><><><><><><><><><><><><><><><><><><><><><> <><><><><><><><><><><><><><><><><><><><><><><><><><><><><><><><><>  - Total Chol =202  is slightly  elevated                                               & high risk for Heart Attack /Stroke /Vascular Dementia     ( Ideal or Goal is less than 180 ! )  But  - Bad /Dangerous LDL Chol =135 is very high  - - >> Sitting on a time Bomb !     ( Ideal or Goal is less than 70 ! )   - Treating with meds to lower Cholesterol is treating the result                                          & NOT treating the cause - The cause is Bad Diet !  - Read or listen to   Dr Wyman Songster 's book    " How Not to Die ! "   - Recommend a stricter plant based low cholesterol diet  - Cholesterol only comes from animal sources                                                                               - ie. meat, dairy, egg yolks  - Eat all the vegetables you want.  - Avoid meat,  Avoid Meat,  Avoid Meat,                                                               especially red meat - Beef AND Pork  - Avoid cheese & dairy - milk & ice cream.   - Cheese is the most concentrated form of trans-fats which                                                               is the worst thing to clog up our arteries.    - Veggie cheese is OK  which can be found in the fresh produce section at                                                                [facility] or [employer] or  [employer] <><><><><><><><><><><><><><><><><><><><><><><><><><><><><><><><><> <><><><><><><><><><><><><><><><><><><><><><><><><><><><><><><><><>  -  As a last resort, you can take medicines if you refuse to improve your diet   <><><><><><><><><><><><><><><><><><><><><><><><><><><><><><><><><> <><><><><><><><><><><><><><><><><><><><><><><><><><><><><><><><><>  - Vitamin D = 99 - Excellent    !                    Please keep dose same  <><><><><><><><><><><><><><><><><><><><><><><><><><><><><><><><><> <><><><><><><><><><><><><><><><><><><><><><><><><><><><><><><><><>  -  All Else - CBC - Kidneys - Electrolytes - Liver - Magnesium & Thyroid    - all  Normal / OK  <><><><><><><><><><><><><><><><><><><><><><><><><><><><><><><><><> <><><><><><><><><><><><><><><><><><><><><><><><><><><><><><><><><>

## 2022-05-05 LAB — LIPID PANEL
Cholesterol: 202 mg/dL — ABNORMAL HIGH (ref <200)
HDL: 51 mg/dL (ref >=40)
LDL Cholesterol (Calc): 135 mg/dL (calc) — ABNORMAL HIGH
Non-HDL Cholesterol (Calc): 151 mg/dL (calc) — ABNORMAL HIGH (ref <130)
Total CHOL/HDL Ratio: 4 (calc) (ref <5.0)
Triglycerides: 68 mg/dL (ref <150)

## 2022-05-05 LAB — CBC WITH DIFFERENTIAL/PLATELET
Absolute Monocytes: 534 cells/uL (ref 200–950)
Basophils Absolute: 49 cells/uL (ref 0–200)
Basophils Relative: 1 %
Eosinophils Absolute: 162 cells/uL (ref 15–500)
Eosinophils Relative: 3.3 %
HCT: 39.7 % (ref 38.5–50.0)
Hemoglobin: 12.4 g/dL — ABNORMAL LOW (ref 13.2–17.1)
Lymphs Abs: 1891 cells/uL (ref 850–3900)
MCH: 25.5 pg — ABNORMAL LOW (ref 27.0–33.0)
MCHC: 31.2 g/dL — ABNORMAL LOW (ref 32.0–36.0)
MCV: 81.5 fL (ref 80.0–100.0)
MPV: 9.5 fL (ref 7.5–12.5)
Monocytes Relative: 10.9 %
Neutro Abs: 2264 cells/uL (ref 1500–7800)
Neutrophils Relative %: 46.2 %
Platelets: 347 10*3/uL (ref 140–400)
RBC: 4.87 10*6/uL (ref 4.20–5.80)
RDW: 13.2 % (ref 11.0–15.0)
Total Lymphocyte: 38.6 %
WBC: 4.9 10*3/uL (ref 3.8–10.8)

## 2022-05-05 LAB — COMPLETE METABOLIC PANEL WITH GFR
AG Ratio: 1.3 (calc) (ref 1.0–2.5)
ALT: 6 U/L — ABNORMAL LOW (ref 9–46)
AST: 24 U/L (ref 10–35)
Albumin: 4.3 g/dL (ref 3.6–5.1)
Alkaline phosphatase (APISO): 67 U/L (ref 35–144)
BUN: 25 mg/dL (ref 7–25)
CO2: 28 mmol/L (ref 20–32)
Calcium: 9.7 mg/dL (ref 8.6–10.3)
Chloride: 101 mmol/L (ref 98–110)
Creat: 1.12 mg/dL (ref 0.70–1.35)
Globulin: 3.3 g/dL (calc) (ref 1.9–3.7)
Glucose, Bld: 73 mg/dL (ref 65–99)
Potassium: 4.2 mmol/L (ref 3.5–5.3)
Sodium: 138 mmol/L (ref 135–146)
Total Bilirubin: 0.4 mg/dL (ref 0.2–1.2)
Total Protein: 7.6 g/dL (ref 6.1–8.1)
eGFR: 72 mL/min/{1.73_m2} (ref >=60)

## 2022-05-05 LAB — TSH: TSH: 2.35 mIU/L (ref 0.40–4.50)

## 2022-05-05 LAB — INSULIN, RANDOM: Insulin: 14.6 u[IU]/mL

## 2022-05-05 LAB — HEMOGLOBIN A1C
Hgb A1c MFr Bld: 6 % of total Hgb — ABNORMAL HIGH (ref <5.7)
Mean Plasma Glucose: 126 mg/dL
eAG (mmol/L): 7 mmol/L

## 2022-05-05 LAB — MAGNESIUM: Magnesium: 1.8 mg/dL (ref 1.5–2.5)

## 2022-05-05 LAB — VITAMIN D 25 HYDROXY (VIT D DEFICIENCY, FRACTURES): Vit D, 25-Hydroxy: 99 ng/mL (ref 30–100)

## 2022-05-26 ENCOUNTER — Telehealth: Payer: Self-pay | Admitting: Internal Medicine

## 2022-05-26 NOTE — Progress Notes (Signed)
  Chronic Care Management   Note  05/26/2022 Name: [name] [name] MRN: [id number] DOB: [birth year]  [name] [name] is a 66 y.o. year old male who is a primary care patient of Unk Pinto, MD. I reached out to Tilda Franco by phone today in response to a referral sent by [name] [name]'s PCP, Unk Pinto, MD.   [name] [name] was given information about Chronic Care Management services today including:  CCM service includes personalized support from designated clinical staff supervised by his physician, including individualized plan of care and coordination with other care providers 24/7 contact phone numbers for assistance for urgent and routine care needs. Service will only be billed when office clinical staff spend 20 minutes or more in a month to coordinate care. Only one practitioner may furnish and bill the service in a calendar month. The patient may stop CCM services at any time (effective at the end of the month) by phone call to the office staff.   Patient agreed to services and verbal consent obtained.   Follow up [place]

## 2022-06-07 ENCOUNTER — Other Ambulatory Visit: Payer: Self-pay | Admitting: Nurse Practitioner

## 2022-06-07 DIAGNOSIS — I10 Essential (primary) hypertension: Secondary | ICD-10-CM

## 2022-06-11 ENCOUNTER — Ambulatory Visit: Payer: Medicare HMO | Admitting: Nurse Practitioner

## 2022-07-01 ENCOUNTER — Telehealth: Payer: Self-pay

## 2022-07-01 NOTE — Telephone Encounter (Signed)
LM-07/01/22-Chart Prep started, Reviewing OV, Consults, Hospital visits, Labs and medication changes.  Chart prep complete for upcoming CP visit.  Total time spent: 45 min.

## 2022-07-02 NOTE — Telephone Encounter (Signed)
LM-07/02/22-Care plan created for upcoming CP initial visit.  Total time spent: 5 min.

## 2022-07-04 NOTE — Telephone Encounter (Signed)
LM-07/04/22-Called patient and confirmed upcoming initial CP visit on 11/21 at 1:30PM office visit. Pt. Has no problems getting his medication from the pharmacy, and has no health concern he'd like to discuss at his visit. Pt. Aware to bring his medication bottles with him to visit.  Total time spent: 6 min.

## 2022-07-08 ENCOUNTER — Other Ambulatory Visit: Payer: Self-pay | Admitting: Internal Medicine

## 2022-07-08 ENCOUNTER — Ambulatory Visit: Payer: Medicare HMO | Admitting: Pharmacy Technician

## 2022-07-08 ENCOUNTER — Other Ambulatory Visit: Payer: Self-pay | Admitting: Nurse Practitioner

## 2022-07-08 DIAGNOSIS — E1122 Type 2 diabetes mellitus with diabetic chronic kidney disease: Secondary | ICD-10-CM

## 2022-07-08 DIAGNOSIS — G720 Drug-induced myopathy: Secondary | ICD-10-CM

## 2022-07-08 DIAGNOSIS — E1169 Type 2 diabetes mellitus with other specified complication: Secondary | ICD-10-CM

## 2022-07-08 DIAGNOSIS — Z79899 Other long term (current) drug therapy: Secondary | ICD-10-CM

## 2022-07-08 DIAGNOSIS — I10 Essential (primary) hypertension: Secondary | ICD-10-CM

## 2022-07-08 MED ORDER — TRULICITY 4.5 MG/0.5ML ~~LOC~~ SOAJ: SUBCUTANEOUS | 3 refills | Status: DC

## 2022-07-14 NOTE — Telephone Encounter (Signed)
LM-07/14/22-Initial CP visit notes edited and uploaded to pts. Documents for CP to review.  Total time spent: 5 min.

## 2022-07-15 NOTE — Progress Notes (Signed)
Initial Pharmacist Visit (CCM)  [name] [name],[name] [name]  38 years, Male  DOB: [birth year]  M: [phone]  __________________________________________________ Chronic Conditions Patient's Chronic Conditions: Hypertension (HTN), Hyperlipidemia/Dyslipidemia (HLD), Chronic Kidney Disease (CKD), Gastroesophageal Reflux Disease (GERD), Chronic Pain, Diabetes (DM)  Summary for PCP:  1. Patient needs Microalb/SCr lab at visit in December to close Medicare Gap. 2. PAP paperwork submitted for Trulicity re-enrollment by Naida Sleight 07/08/22. 3. PCP consider initiating Repatha for HLD due to statin myopathy. 4. Non-adherence to medications identified during CCM visit.  Doctor and Hospital Visits Were there PCP Visits in last 6 months?: Yes PCP Visits details: 01/02/22-Tonya Cranford, NP- Pt. presented for a fall. BP124/70 HR 96. No medication changes noted.  01/10/22-Dana Mull, NP-Pt. presented for AWV and f/u. BP 130/80 HR 94.  05/02/22-Dr. McKeown-Pt. presented for 6 month f/u. BP 118/70 HR 89. No medication changes noted.  Were there Specialist Visits in last 6 months?: No Was there a Hospital Visit in last 30 days?: No Were there other Hospital Visits in last 6 months?: Yes Hospital Visits details: 01/01/22-[facility]-Pt. for a fall, pt. LWBS after triage.  Disease Assessments Subjective Information Visit Completed on: 07/08/2022 Did patient bring medications to appointment?: Yes Subjective: Very nice gentleman presenting in office for CCM initial visit. Patient did bring in medications. He does have some hesitancy around taking them. His goal is weight loss and getting off most prescription medications. He has lost ~35 pounds since starting Trulicity. He works part time at [employer]. He is not married but has a partner that lives with him. He brought in Trulicity PAP renewal paperwork today to complete. Lifestyle habits: Diet: portion control Exercise: Walking at work Tobacco: None Alcohol:  None Caffeine: Coffee daily Recreational drugs: None What is the patient's sleep pattern?: No sleep issues How many hours per night does patient typically sleep?: 8+  SDOH: Accountable Health Communities Health-Related Social Needs Screening Tool (BloggerBowl.es) SDOH questions were documented and reviewed (EMR or [employer]) within the past 12 months or since hospitalization?: No What is your living situation today? (ref #1): I have a steady place to live Think about the place you live. Do you have problems with any of the following? (ref #2): None of the above Within the past 12 months, you worried that your food would run out before you got money to buy more (ref #3): Never true Within the past 12 months, the food you bought just didn't last and you didn't have money to get more (ref #4): Never true In the past 12 months, has lack of reliable transportation kept you from medical appointments, meetings, work or from getting things needed for daily living? (ref #5): No In the past 12 months, has the electric, gas, oil, or water company threatened to shut off services in your home? (ref #6): No How often does anyone, including family and friends, physically hurt you? (ref #7): Never (1) How often does anyone, including family and friends, insult or talk down to you? (ref #8): Never (1) How often does anyone, including friends and family, threaten you with harm? (ref #9): Never (1) How often does anyone, including family and friends, scream or curse at you? (ref #10): Never (1)  Medication Adherence Does the [facility] have access to medication refill history?: Yes Medication adherence rates for STAR metric medications:  Ezetimibe 26m / 90DS / 12/06/21 & 03/15/22 Losartan 1031m/ 90DS / 03/01/22 & 06/07/22 Metformin 50035m 90DS / 08/18/21 & 03/04/22  Medication adherence rates for non-STAR  metric medications:  Ezetimibe 55m / 90DS / 12/06/21 &  03/15/22 Losartan 1029m/ 90DS / 03/01/22 & 06/07/22 Metformin 50051m 90DS / 08/18/21 & 03/04/22  Factors that may affect medication adherence?: Pill burden, Low literacy / Education level, Lack of understanding / insight of condition, Perceived lack of benefit of therapy Name and location of [facility] insurance plan: [employer] Are meds synced by current pharmacy?: No Are meds delivered by current pharmacy?: Yes - by mail order pharmacy Would patient benefit from direct intervention of clinical lead in dispensing process to optimize clinical outcomes?: Yes Are [facility] pharmacy services available where patient lives?: Yes Is patient disadvantaged to use [facility]?: Yes Does patient experience delays in picking up medications due to transportation concerns (getting to pharmacy)?: No Medication organization: [employer] Additional Info: Patient is late on med refills. He admits to noncompliance. We discussed: Use of adherence aids (pillbox, alarms) Assessment:: Non-adherent  Hypertension (HTN) Most Recent BP: 118/70 Most Recent HR: 89 taken on: 05/02/2022 Care Gap: Need BP documented or last BP 140/90 or higher: Addressed Assessed today?: No Drug: Hydrochlorothiazide 63m7mtab QD  Pharmacist Assessment: Appropriate, Effective, Safe, Accessible Drug: Losartan 100mg61mab QD Pharmacist Assessment: Appropriate, Effective, Safe, Accessible Plan/Follow up: PCP to order Microalb/SCr at upcoming AWV  Hyperlipidemia/Dyslipidemia (HLD) Last Lipid panel on: 05/02/2022 TC (Goal<200): 202 LDL: 135 HDL (Goal>40): 51 TG (Goal<150): 68 ASCVD 10-year risk?is:: High (>20%) ASCVD Risk Score: 25.6% Assessed today?: Yes Additional Risk Factors: DM, Obesity, Sedentary lifestyle, Food choices LDL Goal: <70 Does patient have any of the following diagnosis codes that will exclude them from statin therapy?: Myopathy, unspecified - G72.9 Verified ICD-10  code G72.9 is submitted to EMR: Yes Patient has tried and failed: rosuvastatin, simvastatin Additional Info: Patient stopped on his own Zetia due to aversions. We discussed: Complications of hyperlipidemia and prevention methods with patient for 5-10 minutes, Increasing exercise (walking, biking, swimming) to a goal of 30 minutes per day, as able based on current activity level and health or as directed by your healthcare provider, How a diet high in fruits/vegetables/nuts/whole grains/beans may help to reduce your cholesterol. Increasing soluble fiber intake.  Avoiding sugary foods and trans fat, limiting carbohydrates, and reducing portion sizes. Recommended increasing intake of healthy fats into their diet, Weight reduction- We discussed losing 5-10% of body weight Assessment:: Uncontrolled Drug: None Pharmacist Assessment: Query Appropriateness Plan/Follow up: PCP consider Repatha. Diet and lifestyle modifications discussed  Diabetes (DM) Most recent A1C: 6.0 taken on: 05/02/2022 Previous A1C: 6.1 taken on: 01/10/2022 Most Recent GFR: 72 taken on: 05/02/2022 Type: 2 Most recent microalbumin ratio: 70 tested on: 09/24/2021 Care Gap: Statin therapy needed: Patient excluded from population (Age > 75, e48lusion code present) Care Gap: Need A1c documented or last A1c > 9 %: Addressed Care Gap: Need eye exam documented in EMR or by claim: Addressed Care Gap: Need eGFR and uACR for kidney health evaluation: Addressed Assessed today?: No Drug: Metformin 500mg-36mb BID Pharmacist Assessment: Appropriate, Effective, Safe, Accessible Drug: Trulicity-inject 3ml on73ma week Pharmacist Assessment: Appropriate, Effective, Safe, Accessible Plan/Follow up: PCP okayed to increase Trulicity to 4.5mg w7.8EU. PAP paperwork for re-enrollment completed today  GERD Assessed today?: No Drug: None  Chronic kidney disease (CKD) Most Recent GFR: 72 taken on: 05/02/2022 Previous GFR: 74 taken on:  01/10/2022 Most recent microalbumin ratio: 70 tested on: 09/24/2021 Assessed today?: No Chronic Pain Assessed today?: No Drug: Gabapentin 100mg-1 64mTID Pharmacist Assessment: Appropriate, Effective,  Safe, Accessible  Preventative Health Care Gap: Colorectal cancer screening: Addressed Care Gap: Annual Wellness Visit (AWV): Addressed Immunizations needed: Influenza Additional exercise counseling points. We discussed: aiming to lose 5 to 10% of body weight through lifestyle modifications, targeting at least 150 minutes per week of moderate-intensity aerobic exercise., encouraging participation in insurance-covered exercise program through local gym (i.e. [employer]) Additional diet counseling points. We discussed: key components of the DASH diet, key components of a low-carb eating plan, aiming to consume at least 8 cups of water day  CPP Prep: 75mn CPP OV: 332m CPP Doc: [id number] Care Plan: 5mi14mSummary Next CCM Follow Up: 6 months Next AWV: 11/14/22 at 11:00AM Next PCP Visit: 08/01/2022 at 10:45AM  Attestation Statement:: CCM Services:  This encounter meets complex CCM services and moderate to high medical decision making.  Prior to outreach and patient consent for Chronic Care Management, I referred this patient for services after reviewing the nominated patient list or from a personal encounter with the patient.  I have personally reviewed this encounter including the documentation in this note and have collaborated with the care management provider regarding care management and care coordination activities to include development and update of the comprehensive care plan I am certifying that I agree with the content of this note and encounter as supervising physician.  Pharmacy Interventions Intervention Details Pharmacist Interventions discussed: Yes Started Therapy: Preventative therapy, Immunizations recommended, Untreated medical condition Monitoring: Overdue labs,  Preventative health screenings, Routine monitoring Accessibility: Financial assistance Education: Lifestyle modifications, Medication administr  AverMarda StalkerarmD Clinical Pharmacist AverNaida [email or web address] .care [phone]

## 2022-07-17 DIAGNOSIS — E785 Hyperlipidemia, unspecified: Secondary | ICD-10-CM | POA: Diagnosis not present

## 2022-07-17 DIAGNOSIS — I10 Essential (primary) hypertension: Secondary | ICD-10-CM | POA: Diagnosis not present

## 2022-07-17 DIAGNOSIS — E1169 Type 2 diabetes mellitus with other specified complication: Secondary | ICD-10-CM | POA: Diagnosis not present

## 2022-07-30 NOTE — Progress Notes (Signed)
3 MONTH  FOLLOW UP   Assessment:   [name] [name] was seen today for follow-up and medicare wellness.  Diagnoses and all orders for this visit:  Essential hypertension Discussed DASH (Dietary Approaches to Stop Hypertension) DASH diet is lower in sodium than a typical American diet. Cut back on foods that are high in saturated fat, cholesterol, and trans fats. Eat more whole-grain foods, fish, poultry, and nuts Remain active and exercise as tolerated daily.  Monitor BP at home-Call if greater than 130/80.  Check CMP/CBC   Hyperlipidemia, mixed associated with Type 2 Diabetes Mellitus/Statin Myopathy Could not tolerate Simvastatin-tolerates Ezetimibe Discussed lifestyle modifications. Recommended diet heavy in fruits and veggies, omega 3's. Decrease consumption of animal meats, cheeses, and dairy products. Remain active and exercise as tolerated. Continue to monitor. Check lipids/TSH   Type 2 diabetes mellitus with stage 2 chronic kidney disease, without long-term current use of insulin (HCC)/Neuropathy Taking Metformin 517m two tablets twice a day Continue Trulicity 4.5 mg mg SQ QW Education: Reviewed 'ABCs' of diabetes management  Discussed goals to be met and/or maintained include A1C (<7) Blood pressure (<130/80) Cholesterol (LDL <70) Continue Eye Exam yearly  Continue Dental Exam Q6 mo Discussed dietary recommendations Discussed Physical Activity recommendations Foot exam UTD Check A1C  Gastroesophageal reflux disease, unspecified whether esophagitis present Doing well Monitoring diet and avoiding triggers Increase water intake  Morbid obesity with BMI of 40.0-44.9, adult (HCC) Improving Discussed appropriate BMI Goal of losing 1 lb per month. Diet modification. Physical activity. Encouraged/praised to build confidence.  CKD stage 2 due to type 2 diabetes mellitus ([place] Discussed how what you eat and drink can aide in kidney protection. Stay well  hydrated. Avoid high salt foods. Avoid NSAIDS. Keep BP and BG well controlled.   Take medications as prescribed. Remain active and exercise as tolerated daily. Maintain weight.  Continue to monitor. Check CMP/GFR/Microablumin  Aneurysm artery, iliac common (HCC) Control blood pressure, cholesterol, glucose, increase exercise.   Medication management All medications discussed and reviewed in full. All questions and concerns regarding medications addressed.    B12 Deficiency Monitor levels  Orders Placed This Encounter  Procedures   CBC with Differential/Platelet   COMPLETE METABOLIC PANEL WITH GFR   Lipid panel   Hemoglobin A1c   Vitamin B12    Notify office for further evaluation and treatment, questions or concerns if any reported s/s fail to improve.   The patient was advised to call back or seek an in-person evaluation if any symptoms worsen or if the condition fails to improve as anticipated.   Further disposition pending results of labs. Discussed med's effects and SE's.    I discussed the assessment and treatment plan with the patient. The patient was provided an opportunity to ask questions and all were answered. The patient agreed with the plan and demonstrated an understanding of the instructions.  Discussed med's effects and SE's. Screening labs and tests as requested with regular follow-up as recommended.  I provided 20 minutes of face-to-face time during this encounter including counseling, chart review, and critical decision making was preformed.    Future Appointments  Date Time Provider DOakley 11/14/2022 11:00 AM MUnk Pinto MD [facility] None       Subjective:  [name] MORISis a 66y.o. AAM who presents for Medicare Annual Wellness Visit and 3 month follow up for HTN, HLD, DMII with CKD, and vitamin D Def. He also has obstructive sleep apnea, does not use CPAP.  He is on  disability for bilateral carpel tunnel syndrome. He has  retired from [employer].  Overall he reports feeling well.    He has noticed increase in neuropathy in the bilateral heels.  Mostly in the mornings.  Most likely contributed to DM although well controlled.  He is not currently on a B12 supplement.  BMI is Body mass index is 41.05 kg/m., he is currently working on diet and exercise. Continues weight loss with lifestyle modifications and Trulicity. Wt Readings from Last 3 Encounters:  08/01/22 270 lb (122.5 kg)  05/02/22 262 lb 3.2 oz (118.9 kg)  01/10/22 278 lb 6.4 oz (126.3 kg)   His HTN predates 2007.  He has not recently been checking BPs at home, today their BP is BP: 122/82  BP Readings from Last 3 Encounters:  08/01/22 122/82  05/02/22 118/70  01/10/22 130/80  He does workout, cutting grass and is starting at the [facility].  He denies chest pain, shortness of breath, dizziness.     He is not on cholesterol medication (rosuvastatin but had myalgias, could not tolerate simvastatin or pravastatin) He is currently Zetia.  He continues to take Fish oil daily. His cholesterol is not at goal. The cholesterol last visit was:   Lab Results  Component Value Date   CHOL 202 (H) 05/02/2022   HDL 51 05/02/2022   LDLCALC 135 (H) 05/02/2022   TRIG 68 05/02/2022   CHOLHDL 4.0 05/02/2022   He has been working on diet and exercise for Diabetes controlled by metformin with diabetic chronic kidney disease and with diabetic polyneuropathy, he is on gabapentin which is helping, he is on bASA, he is on ACE/ARB, and denies polydipsia, polyuria and visual disturbances. He has not been check fasting glucose. His meter broke last month.  It had been running 90's Lab Results  Component Value Date   HGBA1C 6.0 (H) 05/02/2022   Last GFR Lab Results  Component Value Date   GFRAA 79 12/17/2020   Medication Review: Current Outpatient Medications on File Prior to Visit  Medication Sig Dispense Refill   Alcohol Swabs (DROPSAFE ALCOHOL PREP) 70 % PADS USE TO CHECK  BLOOD SUGAR 1 TIME DAILY 100 each 3   Ascorbic Acid (VITA-C PO) Take by mouth. 3 times a week     aspirin EC 81 MG tablet Take 81 mg by mouth daily.     Blood Glucose Calibration (TRUE METRIX LEVEL 1) Low SOLN Use with glucometer. Check blood sugar 1 time daily-E11.2. 1 each 3   Blood Glucose Monitoring Suppl (TRUE METRIX METER) w/Device KIT Check blood sugar 1 time daily- E11.2 1 kit 0   Cholecalciferol (VITAMIN D-3) 5000 UNITS TABS Take 1 tablet by mouth daily.     ferrous sulfate 325 (65 FE) MG EC tablet Take 325 mg by mouth 3 (three) times daily with meals. Takes Mondays, Wednesdays, Fidays     gabapentin (NEURONTIN) 100 MG capsule TAKE 1 CAPSULE THREE TIMES DAILY FOR NEUROPATHY PAINS IN HANDS 270 capsule 3   glucose blood (TRUE METRIX BLOOD GLUCOSE TEST) test strip CHECK BLOOD SUGAR 1 TIME DAILY 100 strip 3   hydrochlorothiazide (HYDRODIURIL) 25 MG tablet TAKE 1 TABLET DAILY FOR BLOOD PRESSURE AND FLUID 90 tablet 10   losartan (COZAAR) 100 MG tablet TAKE 1 TABLET EVERY DAY FOR BLOOD PRESSURE AND DIABETIC KIDNEY PROTECTION 90 tablet 3   Magnesium-Zinc (MAGNESIUM-CHELATED ZINC PO) Take 400 mg by mouth daily. Takes 1200 mg daily.     metFORMIN (GLUCOPHAGE-XR) 500 MG 24 hr tablet  TAKE 2 TABLETS TWICE A DAY WITH MEALS FOR DIABETES 360 tablet 3   Multiple Vitamins-Minerals (THERAGRAN-M PREMIER 50 PLUS PO) Take 1 tablet by mouth daily.     Omega-3 Fatty Acids (FISH OIL) 500 MG CAPS Take by mouth daily.     phentermine (ADIPEX-P) 37.5 MG tablet TAKE ONE TABLET BY MOUTH EVERY MORNING FOR DIETING AND WEIGHT LOSS 30 tablet 0   tadalafil (CIALIS) 20 MG tablet Take  1/2 to 1 tablet  every 2 to 3 days  if needede for XXXX                              /          TAKE                                             BY                  MOUTH 30 tablet 2   TRUEplus Lancets 30G MISC CHECK BLOOD SUGAR 1 TIME DAILY 606 each 3   TRULICITY 4.5 TK/1.6WF SOPN Inject 1 pen  (4.5 mg) into Skin every 7 days for Diabetes    (e11.29) 6 mL 3   No current facility-administered medications on file prior to visit.    Allergies: Allergies  Allergen Reactions   Crestor [Rosuvastatin Calcium] Other (See Comments)    Cramping   Simvastatin Other (See Comments)    Muscle cramps    Current Problems (verified) has HTN (hypertension); Hyperlipidemia associated with type 2 diabetes mellitus ([place]); GERD (gastroesophageal reflux disease); Morbid obesity (BMI 40+); Vitamin D deficiency; T2_NIDDM w/ Stage 2 CKD ([place]); Medication management; Aneurysm artery, iliac common ([place]); Chronic pain syndrome; Peripheral sensory neuropathy due to T2_NIDDM; Erectile dysfunction associated with type 2 diabetes mellitus ([place]); Chronic lower back pain; Gluttony; Arthritis of right knee; CKD stage 2 due to type 2 diabetes mellitus ([place]); and Statin myopathy on their problem list.    Names of Other Physician/Practitioners you currently use: 1.  [facility] here for primary care 2. Dr. Sherral Hammers, eye doctor, last visit 12/05/20 3. Does not have a dentist- reminded to schedule  Patient Care Team: Unk Pinto, MD as PCP - General (Internal Medicine) Sable Feil, MD as Consulting Physician (Gastroenterology) Monna Fam, MD as Consulting Physician (Ophthalmology) Elsie Saas, MD as Consulting Physician (Orthopedic Surgery) Leeroy Cha, MD as Consulting Physician (Neurosurgery) Tuchman, Leslye Peer, DPM (Inactive) as Consulting Physician (Podiatry) Carleene Mains, [facility] as Pharmacist (Pharmacist)  Surgical: He  has a past surgical history that includes Carpal tunnel release (2008); Knee arthroscopy (2010); and Cervical discectomy (2003). Family His family history includes Diabetes in his father, mother, and sister; Heart disease in his mother; Hypertension in his father; Kidney disease in his sister; Stroke in his mother. Social history  He reports that he quit smoking about 31 years  ago. His smoking use included cigarettes. He has never used smokeless tobacco. He reports that he does not currently use alcohol. He reports that he does not use drugs.    Do you have any new medications? Current Outpatient Medications on File Prior to Visit  Medication Sig   Alcohol Swabs (DROPSAFE ALCOHOL PREP) 70 % PADS USE TO CHECK BLOOD SUGAR 1 TIME DAILY   Ascorbic Acid (VITA-C  PO) Take by mouth. 3 times a week   aspirin EC 81 MG tablet Take 81 mg by mouth daily.   Blood Glucose Calibration (TRUE METRIX LEVEL 1) Low SOLN Use with glucometer. Check blood sugar 1 time daily-E11.2.   Blood Glucose Monitoring Suppl (TRUE METRIX METER) w/Device KIT Check blood sugar 1 time daily- E11.2   Cholecalciferol (VITAMIN D-3) 5000 UNITS TABS Take 1 tablet by mouth daily.   ferrous sulfate 325 (65 FE) MG EC tablet Take 325 mg by mouth 3 (three) times daily with meals. Takes Mondays, Wednesdays, Fidays   gabapentin (NEURONTIN) 100 MG capsule TAKE 1 CAPSULE THREE TIMES DAILY FOR NEUROPATHY PAINS IN HANDS   glucose blood (TRUE METRIX BLOOD GLUCOSE TEST) test strip CHECK BLOOD SUGAR 1 TIME DAILY   hydrochlorothiazide (HYDRODIURIL) 25 MG tablet TAKE 1 TABLET DAILY FOR BLOOD PRESSURE AND FLUID   losartan (COZAAR) 100 MG tablet TAKE 1 TABLET EVERY DAY FOR BLOOD PRESSURE AND DIABETIC KIDNEY PROTECTION   Magnesium-Zinc (MAGNESIUM-CHELATED ZINC PO) Take 400 mg by mouth daily. Takes 1200 mg daily.   metFORMIN (GLUCOPHAGE-XR) 500 MG 24 hr tablet TAKE 2 TABLETS TWICE A DAY WITH MEALS FOR DIABETES   Multiple Vitamins-Minerals (THERAGRAN-M PREMIER 50 PLUS PO) Take 1 tablet by mouth daily.   Omega-3 Fatty Acids (FISH OIL) 500 MG CAPS Take by mouth daily.   phentermine (ADIPEX-P) 37.5 MG tablet TAKE ONE TABLET BY MOUTH EVERY MORNING FOR DIETING AND WEIGHT LOSS   tadalafil (CIALIS) 20 MG tablet Take  1/2 to 1 tablet  every 2 to 3 days  if needede for XXXX                              /          TAKE                                              BY                  MOUTH   TRUEplus Lancets 30G MISC CHECK BLOOD SUGAR 1 TIME DAILY   TRULICITY 4.5 PJ/8.2NK SOPN Inject 1 pen  (4.5 mg) into Skin every 7 days for Diabetes   (e11.29)   No current facility-administered medications on file prior to visit.     Objective:   Today's Vitals   08/01/22 1009  BP: 122/82  Pulse: 99  Temp: (!) 97.5 F (36.4 C)  SpO2: 97%  Weight: 270 lb (122.5 kg)  Height: _0  (1.727 m)      Body mass index is 41.05 kg/m.  General Appearance: Morbidly obese, in no apparent distress. Eyes: PERRLA, EOMs, conjunctiva no swelling or erythema Sinuses: No Frontal/maxillary tenderness ENT/Mouth: Ext aud canals clear, TMs without erythema, bulging. No erythema, swelling, or exudate on post pharynx.  Tonsils not swollen or erythematous. Hearing normal.  Neck: Supple, thyroid normal.  Respiratory: Respiratory effort normal, BS equal bilaterally without rales, rhonchi, wheezing or stridor.  Cardio: RRR with no MRGs. Diminished peripheral pulses without edema.  Abdomen: Soft, obese, + BS.  Non tender, no guarding, rebound, hernias, masses. Lymphatics: Non tender without lymphadenopathy.  Musculoskeletal: Full ROM except right knee,wearing brace.  Normal gait Skin: Warm, dry without rashes, lesions, ecchymosis.  Neuro: Cranial nerves intact. No cerebellar symptoms.  Psych: Awake  and oriented X 3, normal affect, Insight and Judgment appropriate.     Darrol Jump, NP [facility]  08/01/2022

## 2022-08-01 ENCOUNTER — Ambulatory Visit (INDEPENDENT_AMBULATORY_CARE_PROVIDER_SITE_OTHER): Payer: Medicare HMO | Admitting: Nurse Practitioner

## 2022-08-01 VITALS — BP 122/82 | HR 99 | Temp 97.5°F | Ht 68.0 in | Wt 270.0 lb

## 2022-08-01 DIAGNOSIS — E1142 Type 2 diabetes mellitus with diabetic polyneuropathy: Secondary | ICD-10-CM

## 2022-08-01 DIAGNOSIS — K2100 Gastro-esophageal reflux disease with esophagitis, without bleeding: Secondary | ICD-10-CM | POA: Diagnosis not present

## 2022-08-01 DIAGNOSIS — I723 Aneurysm of iliac artery: Secondary | ICD-10-CM

## 2022-08-01 DIAGNOSIS — I10 Essential (primary) hypertension: Secondary | ICD-10-CM

## 2022-08-01 DIAGNOSIS — E1169 Type 2 diabetes mellitus with other specified complication: Secondary | ICD-10-CM | POA: Diagnosis not present

## 2022-08-01 DIAGNOSIS — E1122 Type 2 diabetes mellitus with diabetic chronic kidney disease: Secondary | ICD-10-CM | POA: Diagnosis not present

## 2022-08-01 DIAGNOSIS — N182 Chronic kidney disease, stage 2 (mild): Secondary | ICD-10-CM | POA: Diagnosis not present

## 2022-08-01 DIAGNOSIS — Z6841 Body Mass Index (BMI) 40.0 and over, adult: Secondary | ICD-10-CM

## 2022-08-01 DIAGNOSIS — E785 Hyperlipidemia, unspecified: Secondary | ICD-10-CM | POA: Diagnosis not present

## 2022-08-01 DIAGNOSIS — E538 Deficiency of other specified B group vitamins: Secondary | ICD-10-CM

## 2022-08-01 DIAGNOSIS — Z79899 Other long term (current) drug therapy: Secondary | ICD-10-CM | POA: Diagnosis not present

## 2022-08-01 DIAGNOSIS — E559 Vitamin D deficiency, unspecified: Secondary | ICD-10-CM

## 2022-08-01 NOTE — Patient Instructions (Signed)
Vitamin B12 Deficiency Vitamin B12 deficiency means that your body does not have enough vitamin B12. The body needs this important vitamin: To make red blood cells. To make genes (DNA). To help the nerves work. If you do not have enough vitamin B12 in your body, you can have health problems, such as not having enough red blood cells in the blood (anemia). What are the causes? Not eating enough foods that contain vitamin B12. Not being able to take in (absorb) vitamin B12 from the food that you eat. Certain diseases. A condition in which the body does not make enough of a certain protein. This results in your body not taking in enough vitamin B12. Having a surgery in which part of the stomach or small intestine is taken out. Taking medicines that make it hard for the body to take in vitamin B12. These include: Heartburn medicines. Some medicines that are used to treat diabetes. What increases the risk? Being an older adult. Eating a vegetarian or vegan diet that does not include any foods that come from animals. Not eating enough foods that contain vitamin B12 while you are pregnant. Taking certain medicines. Having alcoholism. What are the signs or symptoms? In some cases, there are no symptoms. If the condition leads to too few blood cells or nerve damage, symptoms can occur, such as: Feeling weak or tired. Not being hungry. Losing feeling (numbness) or tingling in your hands and feet. Redness and burning of the tongue. Feeling sad (depressed). Confusion or memory problems. Trouble walking. If anemia is very bad, symptoms can include: Being short of breath. Being dizzy. Having a very fast heartbeat. How is this treated? Changing the way you eat and drink, such as: Eating more foods that contain vitamin B12. Drinking little or no alcohol. Getting vitamin B12 shots. Taking vitamin B12 supplements by mouth (orally). Your doctor will tell you the dose that is best for you. Follow  these instructions at home: Eating and drinking  Eat foods that come from animals and have a lot of vitamin B12 in them. These include: Meats and poultry. This includes beef, pork, chicken, turkey, and organ meats, such as liver. Seafood, such as clams, rainbow trout, salmon, tuna, and haddock. Eggs. Dairy foods such as milk, yogurt, and cheese. Eat breakfast cereals that have vitamin B12 added to them (are fortified). Check the label. The items listed above may not be a complete list of foods and beverages you can eat and drink. Contact a dietitian for more information. Alcohol use Do not drink alcohol if: Your doctor tells you not to drink. You are pregnant, may be pregnant, or are planning to become pregnant. If you drink alcohol: Limit how much you have to: 0-1 drink a day for women. 0-2 drinks a day for men. Know how much alcohol is in your drink. In the [place] one drink equals one 12 oz bottle of beer (355 mL), one 5 oz glass of wine (148 mL), or one 1 oz glass of hard liquor (44 mL). General instructions Get any vitamin B12 shots if told by your doctor. Take supplements only as told by your doctor. Follow the directions. Keep all follow-up visits. Contact a doctor if: Your symptoms come back. Your symptoms get worse or do not get better with treatment. Get help right away if: You have trouble breathing. You have a very fast heartbeat. You have chest pain. You get dizzy. You faint. These symptoms may be an emergency. Get help right away. Call 911.   Do not wait to see if the symptoms will go away. Do not drive yourself to the hospital. Summary Vitamin B12 deficiency means that your body is not getting enough of the vitamin. In some cases, there are no symptoms of this condition. Treatment may include making a change in the way you eat and drink, getting shots, or taking supplements. Eat foods that have vitamin B12 in them. This information is not intended to replace advice  given to you by your health care provider. Make sure you discuss any questions you have with your health care provider. Document Revised: 03/29/2021 Document Reviewed: 03/29/2021 Elsevier Patient Education  2023 Elsevier Inc.  

## 2022-08-02 LAB — CBC WITH DIFFERENTIAL/PLATELET
Absolute Monocytes: 533 cells/uL (ref 200–950)
Basophils Absolute: 62 cells/uL (ref 0–200)
Basophils Relative: 1.3 %
Eosinophils Absolute: 202 cells/uL (ref 15–500)
Eosinophils Relative: 4.2 %
HCT: 42.3 % (ref 38.5–50.0)
Hemoglobin: 13.6 g/dL (ref 13.2–17.1)
Lymphs Abs: 2093 cells/uL (ref 850–3900)
MCH: 26 pg — ABNORMAL LOW (ref 27.0–33.0)
MCHC: 32.2 g/dL (ref 32.0–36.0)
MCV: 80.7 fL (ref 80.0–100.0)
MPV: 9.3 fL (ref 7.5–12.5)
Monocytes Relative: 11.1 %
Neutro Abs: 1910 cells/uL (ref 1500–7800)
Neutrophils Relative %: 39.8 %
Platelets: 331 10*3/uL (ref 140–400)
RBC: 5.24 10*6/uL (ref 4.20–5.80)
RDW: 12.7 % (ref 11.0–15.0)
Total Lymphocyte: 43.6 %
WBC: 4.8 10*3/uL (ref 3.8–10.8)

## 2022-08-02 LAB — COMPLETE METABOLIC PANEL WITH GFR
AG Ratio: 1.3 (calc) (ref 1.0–2.5)
ALT: 9 U/L (ref 9–46)
AST: 15 U/L (ref 10–35)
Albumin: 4.4 g/dL (ref 3.6–5.1)
Alkaline phosphatase (APISO): 69 U/L (ref 35–144)
BUN/Creatinine Ratio: 21 (calc) (ref 6–22)
BUN: 26 mg/dL — ABNORMAL HIGH (ref 7–25)
CO2: 28 mmol/L (ref 20–32)
Calcium: 9.9 mg/dL (ref 8.6–10.3)
Chloride: 102 mmol/L (ref 98–110)
Creat: 1.25 mg/dL (ref 0.70–1.35)
Globulin: 3.5 g/dL (calc) (ref 1.9–3.7)
Glucose, Bld: 84 mg/dL (ref 65–99)
Potassium: 4.2 mmol/L (ref 3.5–5.3)
Sodium: 140 mmol/L (ref 135–146)
Total Bilirubin: 0.3 mg/dL (ref 0.2–1.2)
Total Protein: 7.9 g/dL (ref 6.1–8.1)
eGFR: 64 mL/min/{1.73_m2} (ref >=60)

## 2022-08-02 LAB — HEMOGLOBIN A1C
Hgb A1c MFr Bld: 6.2 % of total Hgb — ABNORMAL HIGH (ref <5.7)
Mean Plasma Glucose: 131 mg/dL
eAG (mmol/L): 7.3 mmol/L

## 2022-08-02 LAB — LIPID PANEL
Cholesterol: 188 mg/dL (ref <200)
HDL: 53 mg/dL (ref >=40)
LDL Cholesterol (Calc): 114 mg/dL (calc) — ABNORMAL HIGH
Non-HDL Cholesterol (Calc): 135 mg/dL (calc) — ABNORMAL HIGH (ref <130)
Total CHOL/HDL Ratio: 3.5 (calc) (ref <5.0)
Triglycerides: 100 mg/dL (ref <150)

## 2022-08-02 LAB — VITAMIN B12: Vitamin B-12: 581 pg/mL (ref 200–1100)

## 2022-08-23 ENCOUNTER — Other Ambulatory Visit: Payer: Self-pay | Admitting: Nurse Practitioner

## 2022-09-05 ENCOUNTER — Other Ambulatory Visit: Payer: Self-pay | Admitting: Internal Medicine

## 2022-09-05 ENCOUNTER — Telehealth: Payer: Self-pay | Admitting: Nurse Practitioner

## 2022-09-05 DIAGNOSIS — E1122 Type 2 diabetes mellitus with diabetic chronic kidney disease: Secondary | ICD-10-CM

## 2022-09-05 DIAGNOSIS — I10 Essential (primary) hypertension: Secondary | ICD-10-CM

## 2022-09-05 NOTE — Telephone Encounter (Signed)
Patient called, patient requests referral to Dr. Elsie Saas, increasing right knee pain.

## 2022-09-15 ENCOUNTER — Other Ambulatory Visit: Payer: Self-pay

## 2022-09-15 ENCOUNTER — Other Ambulatory Visit: Payer: Self-pay | Admitting: Internal Medicine

## 2022-09-15 DIAGNOSIS — I10 Essential (primary) hypertension: Secondary | ICD-10-CM

## 2022-09-15 MED ORDER — LOSARTAN POTASSIUM 100 MG PO TABS: ORAL_TABLET | ORAL | 3 refills | Status: DC

## 2022-09-30 ENCOUNTER — Encounter: Payer: Medicare HMO | Admitting: Internal Medicine

## 2022-10-15 ENCOUNTER — Telehealth: Payer: Self-pay

## 2022-10-15 NOTE — Progress Notes (Unsigned)
Patient: [name] [name]             DOB: 03/12/1956                  Time: 11:05AM   HC reviewing patients chart prior to assessment call. Reviewing office visits, consults, hospital visits, labs and medication changes. Chart review complete. HC called pt to complete assessment call. VM not setup.  Total Time spent: 62mn   Patient: [name] [name]            DOB: 09/22/1955                  Time: 10:25AM  HC calling pt to complete DM review call and schedule FPO in May. Unable to LVM, no alt #s listed.   Total Time spent: 131m  Patient: [name] [name]           DOB: 06/04/1956                  Time: 3PM 10/17/22 HC calling pt to complete DM review call and schedule FPO in May. Had appt for knee, would not see pt because of a $10 copay. Needs new battery in glucose meter, he has not been checking his sugars regularly. No signs or symptoms of low sugars. Pt has been checking his feet regularly, no signs of cuts, swelling, blister, redness or infection. Pt has not been to see a dentist. HC will look in his area for dentist that accept [employer]. Pt has been keeping up with his diet, eating more vegetables. He has been walking and has a meHigher education careers advisert ThThe Timken CompanyPt states he has been taking 0.5 tab of Phentermine. FPO scheduled 12/23/22. HC will follow up with pt with found dentist.   Total Time spent: 2445m 10/20/22 Patient: [name] [name]          DOB: 11/05/1955                  Time: 4:17 PM  ([phone] [id number] [address]ee[place] [id number] MartinezS [phone] [address]Anna [id number] Denis ([place]mendorfidMeadowdalemin)[id number]) [phone] [address]10[id number] that I call back tomorrow around 1230.  Total Time spent: 4min  15m/24 Patient: [name] [name]      DOB: 06/07/1956                  Time: 12:42 PM HC called above locations to confirm for patient that these locations  accept [employer].  --[place], [facility] phone number does not connect, looked up on [employer], states "permanently closed" -- [facility] in network with [employer]. -- [facility] -- confirmed in network with [employer].  HC called and provided this information, name of location, phone number and address. Pt states he will call later today to make appointment. HC will follow up with pt in 2 weeks to confirm he was able to make an appt.   Total time spent: 26 min

## 2022-11-04 ENCOUNTER — Telehealth: Payer: Self-pay

## 2022-11-04 NOTE — Progress Notes (Signed)
Patient: [name] [name] DOB: 02/14/1956  HC following up with patient to confirm he was able to get into contact with provided dental offices to make appt. [place]  Total time spent: 25min  11/17/22 1:27PM HC connected with pt and confirmed pt was able to schedule dental appt in May 2024. Pt states he had his eye exam on 12/06/21 (report found in EPIC), and has an upcoming eye appt in 11/2021. Pt states he went to [facility] for knee pain. Meds were given to pt, which he has since completed. Pt asked that provider call in same medication for him in case this happens again. HC will contact [facility] to request. HC connected with [facility], they will follow up with pt.   Total time spent: 20 min

## 2022-11-12 ENCOUNTER — Other Ambulatory Visit: Payer: Self-pay

## 2022-11-12 ENCOUNTER — Ambulatory Visit (HOSPITAL_COMMUNITY)
Admission: EM | Admit: 2022-11-12 | Discharge: 2022-11-12 | Disposition: A | Discharge status: Home / Self Care | Payer: Medicare HMO | Attending: Family Medicine | Admitting: Family Medicine

## 2022-11-12 ENCOUNTER — Ambulatory Visit (INDEPENDENT_AMBULATORY_CARE_PROVIDER_SITE_OTHER): Payer: Medicare HMO

## 2022-11-12 ENCOUNTER — Encounter (HOSPITAL_COMMUNITY): Payer: Self-pay | Admitting: Emergency Medicine

## 2022-11-12 DIAGNOSIS — M25561 Pain in right knee: Secondary | ICD-10-CM | POA: Diagnosis not present

## 2022-11-12 DIAGNOSIS — M1711 Unilateral primary osteoarthritis, right knee: Secondary | ICD-10-CM | POA: Diagnosis not present

## 2022-11-12 DIAGNOSIS — G8929 Other chronic pain: Secondary | ICD-10-CM

## 2022-11-12 MED ORDER — PREDNISONE 20 MG PO TABS: 40.0000 mg | ORAL_TABLET | Freq: Every day | ORAL | 0 refills | Status: DC

## 2022-11-12 MED ORDER — MELOXICAM 15 MG PO TABS: 15.0000 mg | ORAL_TABLET | Freq: Every day | ORAL | 0 refills | Status: DC

## 2022-11-12 NOTE — ED Triage Notes (Signed)
Patient has chronic issues with right knee.  Has had cortisone injections.    This episode of pain in right knee started 2 weeks ago.  Pain has worsened recently.  No known recent injury.  Reports pain and swelling to anterior right knee.    Has been taking tylenol arthritis.  Takes gabapentin.

## 2022-11-12 NOTE — Discharge Instructions (Addendum)
Be aware, your blood sugars will likely rise while you are taking the prednisone prescribed. Monitor closely.

## 2022-11-12 NOTE — ED Provider Notes (Addendum)
[facility]   [id number] 11/12/22 Arrival Time: [id number]  ASSESSMENT & PLAN:  1. Chronic pain of right knee   Acute exacerbation,  I have personally viewed and independently interpreted the imaging studies ordered this visit. Degenerative changes of RIGHT knee. No fractures observed.  Trial of: Discharge Medication List as of 11/12/2022 12:13 PM     START taking these medications   Details  meloxicam (MOBIC) 15 MG tablet Take 1 tablet (15 mg total) by mouth daily., Starting Wed 11/12/2022, Normal    predniSONE (DELTASONE) 20 MG tablet Take 2 tablets (40 mg total) by mouth daily., Starting Wed 11/12/2022, Normal       Work/school excuse note: provided. Recommend:  Follow-up Information     Schedule an appointment as soon as possible for a visit  with Robertson.   Contact information: [address] [place] [phone]               Activities as tolerated. Reviewed expectations re: course of current medical issues. Questions answered. Outlined signs and symptoms indicating need for more acute intervention. Patient verbalized understanding. After Visit Summary given.  SUBJECTIVE: History from: patient. [name] [name] is a 67 y.o. male who reports long history of R knee pain. Acute on chronic exacerbation over past week. Denies trauma/injury. "Just hurting more." Occasional swelling. No extremity sensation changes or weakness. No tx PTA. Is ambulatory without difficulty.   Past Surgical History:  Procedure Laterality Date   CARPAL TUNNEL RELEASE  2008   bilateral   CERVICAL DISCECTOMY  2003   KNEE ARTHROSCOPY  2010   left      OBJECTIVE:  Vitals:   11/12/22 1103  BP: (!) 144/84  Pulse: 99  Resp: 20  Temp: 98.3 F (36.8 C)  TempSrc: Oral  SpO2: 94%    General appearance: alert; no distress HEENT: Central; AT Neck: supple with FROM Resp: unlabored respirations Extremities: RLE:  warm with well perfused appearance; mild "soreness" when palpating anterior/medial knee that is poorly localized; without gross deformities; swelling: minimal; bruising: none; knee ROM: normal CV: brisk extremity capillary refill of RLE; 2+ DP pulse of RLE. Skin: warm and dry; no visible rashes Neurologic: gait normal; normal sensation and strength of RLE Psychological: alert and cooperative; normal mood and affect  Imaging: DG Knee 2 Views Right  Result Date: 11/12/2022 CLINICAL DATA:  Acute on chronic knee pain EXAM: RIGHT KNEE - 2 VIEW COMPARISON:  None Available. FINDINGS: No fracture. No dislocation. No knee joint effusion severe tricompartmental degenerative changes, worst in the medial compartment. IMPRESSION: Severe tricompartmental degenerative changes, worst in the medial compartment. Electronically Signed   By: Marin Roberts M.D.   On: 11/12/2022 12:07      Allergies  Allergen Reactions   Crestor [Rosuvastatin Calcium] Other (See Comments)    Cramping   Simvastatin Other (See Comments)    Muscle cramps    Past Medical History:  Diagnosis Date   Diabetes mellitus without complication ([place])    Hyperlipidemia, mixed 09/29/2012   Hypertension    Vitamin D deficiency    Social History   Socioeconomic History   Marital status: Single    Spouse name: Not on file   Number of children: Not on file   Years of education: Not on file   Highest education level: Not on file  Occupational History   Not on file  Tobacco Use   Smoking status: Former  Types: Cigarettes    Quit date: 02/25/1991    Years since quitting: 31.7   Smokeless tobacco: Never  Vaping Use   Vaping Use: Never used  Substance and Sexual Activity   Alcohol use: Not Currently   Drug use: No   Sexual activity: Not on file  Other Topics Concern   Not on file  Social History Narrative   Not on file   Social Determinants of Health   Financial Resource Strain: Not on file  Food Insecurity: Not on file   Transportation Needs: Not on file  Physical Activity: Not on file  Stress: Not on file  Social Connections: Not on file   Family History  Problem Relation Age of Onset   Heart disease Mother    Stroke Mother    Diabetes Mother    Diabetes Father    Hypertension Father    Diabetes Sister    Kidney disease Sister    Colon cancer Neg Hx    Stomach cancer Neg Hx    Past Surgical History:  Procedure Laterality Date   CARPAL TUNNEL RELEASE  2008   bilateral   CERVICAL DISCECTOMY  2003   KNEE ARTHROSCOPY  2010   left       Vanessa Kick, MD 11/12/22 1322    Vanessa Kick, MD 11/12/22 1322

## 2022-11-14 ENCOUNTER — Encounter: Payer: Medicare HMO | Admitting: Internal Medicine

## 2022-11-17 ENCOUNTER — Telehealth: Payer: Self-pay

## 2022-11-17 NOTE — Progress Notes (Signed)
error 

## 2022-12-09 ENCOUNTER — Ambulatory Visit: Payer: Medicare HMO | Admitting: Sports Medicine

## 2022-12-10 ENCOUNTER — Encounter: Payer: Medicare HMO | Admitting: Internal Medicine

## 2022-12-14 ENCOUNTER — Other Ambulatory Visit: Payer: Self-pay | Admitting: Nurse Practitioner

## 2022-12-16 NOTE — Progress Notes (Unsigned)
CPE AND FOLLOW UP   Assessment:   [name] [name] was seen today for follow-up and medicare wellness.  Diagnoses and all orders for this visit:  Encounter for General adult medical examination with abnormal findings Yearly  Essential hypertension Continue medication: Cozaar 100mg , HCTZ 35mg  daily Monitor blood pressure at home; call if consistently over 130/80 Continue DASH diet.   Reminder to go to the ER if any CP, SOB, nausea, dizziness, severe HA, changes vision/speech, left arm numbness and tingling and jaw pain. Check CMP  Hyperlipidemia, mixed associated with Type 2 Diabetes Mellitus/Statin Myopathy    Could not tolerate Simvastatin     Tolerating Zetia      Continue diet and exercise  Lipid Panel, CMP  Type 2 diabetes mellitus with stage 2 chronic kidney disease, without long-term current use of insulin (HCC) Taking Metformin 500mg  two tablets twice a day Continue Trulicity 3 mg SQ QW Not checking blood glucose, discussed this at length with patien       Discussed dietary and exercise modifications  A1c  Vitamin D deficiency Continue supplementation 5,000IU daily  Gastroesophageal reflux disease, unspecified whether esophagitis present Doing well Monitoring diet and avoiding triggers Increase water intake  Morbid obesity with BMI of 40.0-44.9, adult (HCC) Discussed dietary and exercise modifications at length with patient. Continue Phentermine and Trulicity  CKD stage 2 due to type 2 diabetes mellitus (HCC) Continue glucose monitoring Continue medications Increase fluids Avoid NSAIDS Discussed dietary modifications and exercise Will continue to monitor  Erectile dysfunction associated with type 2 diabetes mellitus (HCC) Continue Cialis 20 mg 1/2-1 tab every 2-3 days as needed Discussed PRN use, was taking daily without desired effect Discussed medication and side effects  Aneurysm artery, iliac common (HCC) Control blood pressure, cholesterol, glucose,  increase exercise.   Medication management Continued     Further disposition pending results if labs check today. Discussed med's effects and SE's.   Over 30 minutes of face to face interview, exam, counseling, chart review, and critical decision making was performed.    Future Appointments  Date Time Provider Department Center  12/17/2022  3:00 PM Raynelle Dick, NP [facility] None  12/17/2023  3:00 PM Raynelle Dick, NP [facility] None       Subjective:  [name] [name] is a 67 y.o. AAM who presents for Medicare Annual Wellness Visit and 3 month follow up for HTN, HLD, DMII with CKD, and vitamin D Def. He also has obstructive sleep apnea, does not use CPAP.  He is on disability for bilateral carpel tunnel syndrome. He has retired from [employer].  BMI is There is no height or weight on file to calculate BMI., he is currently working on diet and exercise, would like to get down to 185 lb. He is going to restart phentermine.  Takes 1/2 tab daily and does decrease appetite. He mows 3-4 yards a week with push mower Wt Readings from Last 3 Encounters:  08/01/22 270 lb (122.5 kg)  05/02/22 262 lb 3.2 oz (118.9 kg)  01/10/22 278 lb 6.4 oz (126.3 kg)   His HTN predates 2007.  He has not recently been checking BPs at home, today their BP is    BP Readings from Last 3 Encounters:  11/12/22 (!) 144/84  08/01/22 122/82  05/02/22 118/70  He does workout, cutting grass and is starting at the [facility].  He denies chest pain, shortness of breath, dizziness.     He is not on cholesterol medication (rosuvastatin but had myalgias, could  not tolerate simvastatin or pravastatin) He is currently Zetia.  He continues to take Fish oil daily. His cholesterol is not at goal. The cholesterol last visit was:   Lab Results  Component Value Date   CHOL 188 08/01/2022   HDL 53 08/01/2022   LDLCALC 114 (H) 08/01/2022   TRIG 100 08/01/2022   CHOLHDL 3.5 08/01/2022   He has been working on diet and exercise  for Diabetes controlled by metformin with diabetic chronic kidney disease and with diabetic polyneuropathy, he is on gabapentin which is helping, he is on bASA, he is on ACE/ARB, and denies polydipsia, polyuria and visual disturbances. He has not been check fasting glucose. His meter broke last month.  It had been running 90's Lab Results  Component Value Date   HGBA1C 6.2 (H) 08/01/2022   Last GFR Lab Results  Component Value Date   GFRAA 79 12/17/2020   Patient is on Vitamin D supplement.  Last visit he was at goal. Lab Results  Component Value Date   VD25OH 99 05/02/2022        Medication Review: Current Outpatient Medications on File Prior to Visit  Medication Sig Dispense Refill   Alcohol Swabs (DROPSAFE ALCOHOL PREP) 70 % PADS USE TO CHECK BLOOD SUGAR 1 TIME DAILY 100 each 3   Ascorbic Acid (VITA-C PO) Take by mouth. 3 times a week     aspirin EC 81 MG tablet Take 81 mg by mouth daily.     Blood Glucose Calibration (TRUE METRIX LEVEL 1) Low SOLN Use with glucometer. Check blood sugar 1 time daily-E11.2. 1 each 3   Blood Glucose Monitoring Suppl (TRUE METRIX METER) w/Device KIT Check blood sugar 1 time daily- E11.2 1 kit 0   Cholecalciferol (VITAMIN D-3) 5000 UNITS TABS Take 1 tablet by mouth daily.     ferrous sulfate 325 (65 FE) MG EC tablet Take 325 mg by mouth 3 (three) times daily with meals. Takes Mondays, Wednesdays, Fidays     gabapentin (NEURONTIN) 100 MG capsule TAKE 1 CAPSULE THREE TIMES DAILY FOR NEUROPATHY PAINS IN HANDS 270 capsule 3   glimepiride (AMARYL) 4 MG tablet TAKE 1 TABLET TWICE DAILY WITH MEALS FOR DIABETES 180 tablet 3   glucose blood (TRUE METRIX BLOOD GLUCOSE TEST) test strip CHECK BLOOD SUGAR 1 TIME DAILY 100 strip 3   hydrochlorothiazide (HYDRODIURIL) 25 MG tablet TAKE 1 TABLET DAILY FOR BLOOD PRESSURE AND FLUID 90 tablet 10   losartan (COZAAR) 100 MG tablet Take 1 TABLET  Daily FOR BP & DIABETIC Kidney Protection 90 tablet 3   Magnesium-Zinc  (MAGNESIUM-CHELATED ZINC PO) Take 400 mg by mouth daily. Takes 1200 mg daily.     meloxicam (MOBIC) 15 MG tablet Take 1 tablet (15 mg total) by mouth daily. 7 tablet 0   metFORMIN (GLUCOPHAGE-XR) 500 MG 24 hr tablet TAKE 2 TABLETS TWICE A DAY WITH MEALS FOR DIABETES 360 tablet 3   Multiple Vitamins-Minerals (THERAGRAN-M PREMIER 50 PLUS PO) Take 1 tablet by mouth daily.     Omega-3 Fatty Acids (FISH OIL) 500 MG CAPS Take by mouth daily.     phentermine (ADIPEX-P) 37.5 MG tablet TAKE ONE TABLET BY MOUTH EVERY MORNING FOR DIETING AND WEIGHT LOSS 30 tablet 0   predniSONE (DELTASONE) 20 MG tablet Take 2 tablets (40 mg total) by mouth daily. 10 tablet 0   tadalafil (CIALIS) 20 MG tablet Take  1/2 to 1 tablet  every 2 to 3 days  if needede for XXXX                              /  TAKE                                             BY                  MOUTH 30 tablet 2   TRUEplus Lancets 30G MISC CHECK BLOOD SUGAR 1 TIME DAILY 100 each 3   TRULICITY 4.5 MG/0.5ML SOPN Inject 1 pen  (4.5 mg) into Skin every 7 days for Diabetes   (e11.29) 6 mL 3   No current facility-administered medications on file prior to visit.    Allergies: Allergies  Allergen Reactions   Crestor [Rosuvastatin Calcium] Other (See Comments)    Cramping   Simvastatin Other (See Comments)    Muscle cramps    Current Problems (verified) has HTN (hypertension); Hyperlipidemia associated with type 2 diabetes mellitus (HCC); GERD (gastroesophageal reflux disease); Morbid obesity (BMI 40+); Vitamin D deficiency; T2_NIDDM w/ Stage 2 CKD (HCC); Medication management; Aneurysm artery, iliac common (HCC); Chronic pain syndrome; Peripheral sensory neuropathy due to T2_NIDDM; Erectile dysfunction associated with type 2 diabetes mellitus (HCC); Chronic lower back pain; Gluttony; Arthritis of right knee; CKD stage 2 due to type 2 diabetes mellitus (HCC); and Statin myopathy on their problem list.  Screening Tests Immunization History   Administered Date(s) Administered   PNEUMOCOCCAL CONJUGATE-20 06/11/2021   Pneumococcal-Unspecified 02/16/2013   Td 06/11/2021   Tdap 02/10/2011   Preventative care: Last colonoscopy: 2013, due 2023 Ct head 11/2014 US aorta 2015   Prior vaccinations: TD or Tdap: 06/11/21  Influenza: Declined  Pneumococcal: 06/11/21 Prevnar13: Declined until 65 Shingles/Zostavax: Declined SARS-COV-Moderna Complete: 04/2020   Names of Other Physician/Practitioners you currently use: 1. [facility] here for primary care 2. Dr. Joseph Art, eye doctor, last visit 12/05/20 3. Does not have a dentist- reminded to schedule  Patient Care Team: Lucky Cowboy, MD as PCP - General (Internal Medicine) Mardella Layman, MD as Consulting Physician (Gastroenterology) Mateo Flow, MD as Consulting Physician (Ophthalmology) Salvatore Marvel, MD as Consulting Physician (Orthopedic Surgery) Hilda Lias, MD as Consulting Physician (Neurosurgery) Tuchman, Fanny Bien, DPM (Inactive) as Consulting Physician (Podiatry) Sundra Aland, [facility] as Pharmacist (Pharmacist)  Surgical: He  has a past surgical history that includes Carpal tunnel release (2008); Knee arthroscopy (2010); and Cervical discectomy (2003). Family His family history includes Diabetes in his father, mother, and sister; Heart disease in his mother; Hypertension in his father; Kidney disease in his sister; Stroke in his mother. Social history  He reports that he quit smoking about 31 years ago. His smoking use included cigarettes. He has never used smokeless tobacco. He reports that he does not currently use alcohol. He reports that he does not use drugs.    Do you have any new medications? Current Outpatient Medications on File Prior to Visit  Medication Sig   Alcohol Swabs (DROPSAFE ALCOHOL PREP) 70 % PADS USE TO CHECK BLOOD SUGAR 1 TIME DAILY   Ascorbic Acid (VITA-C PO) Take by mouth. 3 times a week    aspirin EC 81 MG tablet Take 81 mg by mouth daily.   Blood Glucose Calibration (TRUE METRIX LEVEL 1) Low SOLN Use with glucometer. Check blood sugar 1 time daily-E11.2.   Blood Glucose Monitoring Suppl (TRUE METRIX METER) w/Device KIT Check blood sugar 1 time daily- E11.2   Cholecalciferol (VITAMIN D-3) 5000 UNITS TABS Take  1 tablet by mouth daily.   ferrous sulfate 325 (65 FE) MG EC tablet Take 325 mg by mouth 3 (three) times daily with meals. Takes Mondays, Wednesdays, Fidays   gabapentin (NEURONTIN) 100 MG capsule TAKE 1 CAPSULE THREE TIMES DAILY FOR NEUROPATHY PAINS IN HANDS   glimepiride (AMARYL) 4 MG tablet TAKE 1 TABLET TWICE DAILY WITH MEALS FOR DIABETES   glucose blood (TRUE METRIX BLOOD GLUCOSE TEST) test strip CHECK BLOOD SUGAR 1 TIME DAILY   hydrochlorothiazide (HYDRODIURIL) 25 MG tablet TAKE 1 TABLET DAILY FOR BLOOD PRESSURE AND FLUID   losartan (COZAAR) 100 MG tablet Take 1 TABLET  Daily FOR BP & DIABETIC Kidney Protection   Magnesium-Zinc (MAGNESIUM-CHELATED ZINC PO) Take 400 mg by mouth daily. Takes 1200 mg daily.   meloxicam (MOBIC) 15 MG tablet Take 1 tablet (15 mg total) by mouth daily.   metFORMIN (GLUCOPHAGE-XR) 500 MG 24 hr tablet TAKE 2 TABLETS TWICE A DAY WITH MEALS FOR DIABETES   Multiple Vitamins-Minerals (THERAGRAN-M PREMIER 50 PLUS PO) Take 1 tablet by mouth daily.   Omega-3 Fatty Acids (FISH OIL) 500 MG CAPS Take by mouth daily.   phentermine (ADIPEX-P) 37.5 MG tablet TAKE ONE TABLET BY MOUTH EVERY MORNING FOR DIETING AND WEIGHT LOSS   predniSONE (DELTASONE) 20 MG tablet Take 2 tablets (40 mg total) by mouth daily.   tadalafil (CIALIS) 20 MG tablet Take  1/2 to 1 tablet  every 2 to 3 days  if needede for XXXX                              /          TAKE                                             BY                  MOUTH   TRUEplus Lancets 30G MISC CHECK BLOOD SUGAR 1 TIME DAILY   TRULICITY 4.5 MG/0.5ML SOPN Inject 1 pen  (4.5 mg) into Skin every 7 days for Diabetes    (e11.29)   No current facility-administered medications on file prior to visit.     Objective:   There were no vitals filed for this visit.    There is no height or weight on file to calculate BMI.  General Appearance: Morbidly obese, in no apparent distress. Eyes: PERRLA, EOMs, conjunctiva no swelling or erythema Sinuses: No Frontal/maxillary tenderness ENT/Mouth: Ext aud canals clear, TMs without erythema, bulging. No erythema, swelling, or exudate on post pharynx.  Tonsils not swollen or erythematous. Hearing normal.  Neck: Supple, thyroid normal.  Respiratory: Respiratory effort normal, BS equal bilaterally without rales, rhonchi, wheezing or stridor.  Cardio: RRR with no MRGs. Diminished peripheral pulses without edema.  Abdomen: Soft, obese, + BS.  Non tender, no guarding, rebound, hernias, masses. Lymphatics: Non tender without lymphadenopathy.  Musculoskeletal: Full ROM except right knee,wearing brace.  Normal gait Skin: Warm, dry without rashes, lesions, ecchymosis.  Neuro: Cranial nerves intact. No cerebellar symptoms.  Psych: Awake and oriented X 3, normal affect, Insight and Judgment appropriate.     Revonda Humphrey ANP-C  [facility]  12/16/2022

## 2022-12-17 ENCOUNTER — Ambulatory Visit (INDEPENDENT_AMBULATORY_CARE_PROVIDER_SITE_OTHER): Payer: Medicare HMO | Admitting: Nurse Practitioner

## 2022-12-17 ENCOUNTER — Encounter: Payer: Self-pay | Admitting: Nurse Practitioner

## 2022-12-17 VITALS — BP 124/66 | HR 94 | Temp 97.7°F | Ht 68.0 in | Wt 275.0 lb

## 2022-12-17 DIAGNOSIS — I10 Essential (primary) hypertension: Secondary | ICD-10-CM | POA: Diagnosis not present

## 2022-12-17 DIAGNOSIS — I700 Atherosclerosis of aorta: Secondary | ICD-10-CM | POA: Diagnosis not present

## 2022-12-17 DIAGNOSIS — K2100 Gastro-esophageal reflux disease with esophagitis, without bleeding: Secondary | ICD-10-CM | POA: Diagnosis not present

## 2022-12-17 DIAGNOSIS — I723 Aneurysm of iliac artery: Secondary | ICD-10-CM

## 2022-12-17 DIAGNOSIS — Z0000 Encounter for general adult medical examination without abnormal findings: Secondary | ICD-10-CM | POA: Diagnosis not present

## 2022-12-17 DIAGNOSIS — Z125 Encounter for screening for malignant neoplasm of prostate: Secondary | ICD-10-CM

## 2022-12-17 DIAGNOSIS — E1122 Type 2 diabetes mellitus with diabetic chronic kidney disease: Secondary | ICD-10-CM

## 2022-12-17 DIAGNOSIS — E1169 Type 2 diabetes mellitus with other specified complication: Secondary | ICD-10-CM

## 2022-12-17 DIAGNOSIS — Z79899 Other long term (current) drug therapy: Secondary | ICD-10-CM

## 2022-12-17 DIAGNOSIS — R350 Frequency of micturition: Secondary | ICD-10-CM

## 2022-12-17 DIAGNOSIS — G720 Drug-induced myopathy: Secondary | ICD-10-CM

## 2022-12-17 DIAGNOSIS — Z1329 Encounter for screening for other suspected endocrine disorder: Secondary | ICD-10-CM

## 2022-12-17 DIAGNOSIS — Z136 Encounter for screening for cardiovascular disorders: Secondary | ICD-10-CM | POA: Diagnosis not present

## 2022-12-17 DIAGNOSIS — Z0001 Encounter for general adult medical examination with abnormal findings: Secondary | ICD-10-CM

## 2022-12-17 DIAGNOSIS — E785 Hyperlipidemia, unspecified: Secondary | ICD-10-CM | POA: Diagnosis not present

## 2022-12-17 DIAGNOSIS — Z1389 Encounter for screening for other disorder: Secondary | ICD-10-CM

## 2022-12-17 DIAGNOSIS — E1142 Type 2 diabetes mellitus with diabetic polyneuropathy: Secondary | ICD-10-CM

## 2022-12-17 DIAGNOSIS — N182 Chronic kidney disease, stage 2 (mild): Secondary | ICD-10-CM | POA: Diagnosis not present

## 2022-12-17 DIAGNOSIS — E559 Vitamin D deficiency, unspecified: Secondary | ICD-10-CM | POA: Diagnosis not present

## 2022-12-17 LAB — CBC WITH DIFFERENTIAL/PLATELET
Eosinophils Absolute: 352 cells/uL (ref 15–500)
HCT: 38.4 % — ABNORMAL LOW (ref 38.5–50.0)
Hemoglobin: 12.3 g/dL — ABNORMAL LOW (ref 13.2–17.1)
Lymphs Abs: 2765 cells/uL (ref 850–3900)
Monocytes Relative: 9.4 %
RDW: 13.3 % (ref 11.0–15.0)

## 2022-12-17 MED ORDER — REPATHA SURECLICK 140 MG/ML ~~LOC~~ SOAJ
140.0000 mg | SUBCUTANEOUS | 4 refills | Status: DC
Start: 2022-12-17 — End: 2023-07-24

## 2022-12-17 MED ORDER — REPATHA SURECLICK 140 MG/ML ~~LOC~~ SOAJ: 140.0000 mg | SUBCUTANEOUS | 0 refills | Status: DC

## 2022-12-17 NOTE — Patient Instructions (Addendum)
Inject repatha once every 14 days  Evolocumab Injection What is this medication? EVOLOCUMAB (e voe LOK ue mab) treats high cholesterol. It may also be used to lower the risk of heart attack, stroke, and a type of heart surgery. It works by decreasing bad cholesterol (such as LDL) in your blood. It is a monoclonal antibody. Changes to diet and exercise are often combined with this medication. This medicine may be used for other purposes; ask your health care provider or pharmacist if you have questions. COMMON BRAND NAME(S): Repatha, Repatha SureClick What should I tell my care team before I take this medication? They need to know if you have any of these conditions: An unusual or allergic reaction to evolocumab, latex, other medications, foods, dyes, or preservatives Pregnant or trying to get pregnant Breast-feeding How should I use this medication? This medication is injected under the skin. You will be taught how to prepare and give it. Take it as directed on the prescription label at the same time every day. Keep taking it unless your care team tells you to stop. It is important that you put your used needles and syringes in a special sharps container. Do not put them in a trash can. If you do not have a sharps container, call your pharmacist or care team to get one. This medication comes with INSTRUCTIONS FOR USE. Ask your pharmacist for directions on how to use this medication. Read the information carefully. Talk to your pharmacist or care team if you have questions. Talk to your care team about the use of this medication in children. While it may be prescribed for children as young as 10 years for selected conditions, precautions do apply. Overdosage: If you think you have taken too much of this medicine contact a poison control center or emergency room at once. NOTE: This medicine is only for you. Do not share this medicine with others. What if I miss a dose? It is important not to miss  any doses. Talk to your care team about what to do if you miss a dose. What may interact with this medication? Interactions are not expected. This list may not describe all possible interactions. Give your health care provider a list of all the medicines, herbs, non-prescription drugs, or dietary supplements you use. Also tell them if you smoke, drink alcohol, or use illegal drugs. Some items may interact with your medicine. What should I watch for while using this medication? Visit your care team for regular checks on your progress. Tell your care team if your symptoms do not start to get better or if they get worse. You may need blood work while you are taking this medication. Do not wear the on-body infuser during an MRI. Taking this medication is only part of a total heart healthy program. Ask your care team if there are other changes you can make to improve your overall health. What side effects may I notice from receiving this medication? Side effects that you should report to your care team as soon as possible: Allergic reactions or angioedema--skin rash, itching or hives, swelling of the face, eyes, lips, tongue, arms, or legs, trouble swallowing or breathing Side effects that usually do not require medical attention (report to your care team if they continue or are bothersome): Back pain Flu-like symptoms--fever, chills, muscle pain, cough, headache, fatigue Pain, redness, or irritation at injection site Runny or stuffy nose Sore throat This list may not describe all possible side effects. Call your doctor for  medical advice about side effects. You may report side effects to FDA at 1-800-FDA-1088. Where should I keep my medication? Keep out of the reach of children and pets. Store in a refrigerator or at room temperature between 20 and 25 degrees C (68 and 77 degrees F). Refrigeration (preferred): Store it in the refrigerator. Do not freeze. Keep it in the original carton until you are  ready to take it. Remove the dose from the carton about 30 minutes before it is time for you to take it. Get rid of any unused medication after the expiration date. Room temperature: This medication may be stored at room temperature for up to 30 days. Keep it in the original carton until you are ready to take it. If it is stored at room temperature, get rid of any unused medication after 30 days or after it expires, whichever is first. Protect from light. Do not shake. Avoid exposure to extreme heat. To get rid of medications that are no longer needed or have expired: Take the medication to a medication take-back program. Check with your pharmacy or law enforcement to find a location. If you cannot return the medication, ask your pharmacist or care team how to get rid of this medication safely. NOTE: This sheet is a summary. It may not cover all possible information. If you have questions about this medicine, talk to your doctor, pharmacist, or health care provider.  2023 Elsevier/Gold Standard (2021-08-06 00:00:00)

## 2022-12-18 ENCOUNTER — Telehealth: Payer: Self-pay

## 2022-12-18 LAB — MICROALBUMIN / CREATININE URINE RATIO
Creatinine, Urine: 136 mg/dL (ref 20–320)
Microalb Creat Ratio: 8 mg/g creat (ref <30)
Microalb, Ur: 1.1 mg/dL

## 2022-12-18 LAB — URINALYSIS, ROUTINE W REFLEX MICROSCOPIC
Bilirubin Urine: NEGATIVE
Glucose, UA: NEGATIVE
Hgb urine dipstick: NEGATIVE
Ketones, ur: NEGATIVE
Leukocytes,Ua: NEGATIVE
Nitrite: NEGATIVE
Protein, ur: NEGATIVE
Specific Gravity, Urine: 1.022 (ref 1.001–1.035)
pH: 5.5 (ref 5.0–8.0)

## 2022-12-18 LAB — COMPLETE METABOLIC PANEL WITH GFR
AG Ratio: 1.2 (calc) (ref 1.0–2.5)
ALT: 7 U/L — ABNORMAL LOW (ref 9–46)
AST: 17 U/L (ref 10–35)
Albumin: 4.1 g/dL (ref 3.6–5.1)
Alkaline phosphatase (APISO): 71 U/L (ref 35–144)
BUN: 25 mg/dL (ref 7–25)
CO2: 26 mmol/L (ref 20–32)
Calcium: 9.7 mg/dL (ref 8.6–10.3)
Chloride: 102 mmol/L (ref 98–110)
Creat: 1.23 mg/dL (ref 0.70–1.35)
Globulin: 3.4 g/dL (calc) (ref 1.9–3.7)
Glucose, Bld: 79 mg/dL (ref 65–99)
Potassium: 4 mmol/L (ref 3.5–5.3)
Sodium: 139 mmol/L (ref 135–146)
Total Bilirubin: 0.3 mg/dL (ref 0.2–1.2)
Total Protein: 7.5 g/dL (ref 6.1–8.1)
eGFR: 64 mL/min/{1.73_m2} (ref >=60)

## 2022-12-18 LAB — CBC WITH DIFFERENTIAL/PLATELET
Absolute Monocytes: 602 cells/uL (ref 200–950)
Basophils Absolute: 58 cells/uL (ref 0–200)
Basophils Relative: 0.9 %
Eosinophils Relative: 5.5 %
MCH: 26.2 pg — ABNORMAL LOW (ref 27.0–33.0)
MCHC: 32 g/dL (ref 32.0–36.0)
MCV: 81.7 fL (ref 80.0–100.0)
MPV: 9.4 fL (ref 7.5–12.5)
Neutro Abs: 2624 cells/uL (ref 1500–7800)
Neutrophils Relative %: 41 %
Platelets: 382 10*3/uL (ref 140–400)
RBC: 4.7 10*6/uL (ref 4.20–5.80)
Total Lymphocyte: 43.2 %
WBC: 6.4 10*3/uL (ref 3.8–10.8)

## 2022-12-18 LAB — LIPID PANEL
Cholesterol: 210 mg/dL — ABNORMAL HIGH (ref <200)
HDL: 51 mg/dL (ref >=40)
LDL Cholesterol (Calc): 136 mg/dL (calc) — ABNORMAL HIGH
Non-HDL Cholesterol (Calc): 159 mg/dL (calc) — ABNORMAL HIGH (ref <130)
Total CHOL/HDL Ratio: 4.1 (calc) (ref <5.0)
Triglycerides: 123 mg/dL (ref <150)

## 2022-12-18 LAB — HEMOGLOBIN A1C
Hgb A1c MFr Bld: 6.4 % of total Hgb — ABNORMAL HIGH (ref <5.7)
Mean Plasma Glucose: 137 mg/dL
eAG (mmol/L): 7.6 mmol/L

## 2022-12-18 LAB — MAGNESIUM: Magnesium: 1.8 mg/dL (ref 1.5–2.5)

## 2022-12-18 LAB — VITAMIN D 25 HYDROXY (VIT D DEFICIENCY, FRACTURES): Vit D, 25-Hydroxy: 81 ng/mL (ref 30–100)

## 2022-12-18 LAB — PSA: PSA: 1.2 ng/mL (ref <=4.00)

## 2022-12-18 LAB — TSH: TSH: 1.53 mIU/L (ref 0.40–4.50)

## 2022-12-18 NOTE — Telephone Encounter (Signed)
Prior auth completed and submitted. 

## 2022-12-18 NOTE — Telephone Encounter (Signed)
Prior auth approved through 08/18/23. 

## 2022-12-23 ENCOUNTER — Telehealth: Payer: Self-pay | Admitting: Pharmacist

## 2022-12-23 NOTE — Progress Notes (Unsigned)
HC reviewing pts chart prior to general review call. Reviewing office visits, consults, hospital visits, lab and medication adherence/changes. No voicemail set up. ( )  12/25/22-BP- HC spoke with patient and completed general review call. Patient is requesting that PCP refill his Meloxicam and Prednisone that was prescribed at Urgent care in March for right knee pain. Patient also states that he missed his Orthopedic appointment and has been marked as a no show and needs that referral redone. [employer] is waiting on information from PCP to send patient a new glucometer at no cost and patient is asking in regard to that as well. I advised patient that I would send a message to PCP addressing all the above and someone would reach out with questions. Was recently started on Repatha, picked that up recently as it needed a PA that was approved. Patient also states the finally seen a dentist and is scheduled to have a deep cleaning done. ( )  Total time ( )

## 2022-12-25 ENCOUNTER — Encounter: Payer: Self-pay | Admitting: Internal Medicine

## 2022-12-25 NOTE — Progress Notes (Cosign Needed)
Error

## 2023-01-06 DIAGNOSIS — E119 Type 2 diabetes mellitus without complications: Secondary | ICD-10-CM | POA: Diagnosis not present

## 2023-01-07 LAB — HM DIABETES EYE EXAM

## 2023-01-13 DIAGNOSIS — H524 Presbyopia: Secondary | ICD-10-CM | POA: Diagnosis not present

## 2023-01-13 DIAGNOSIS — H5203 Hypermetropia, bilateral: Secondary | ICD-10-CM | POA: Diagnosis not present

## 2023-01-14 ENCOUNTER — Telehealth: Payer: Self-pay | Admitting: *Deleted

## 2023-01-14 NOTE — Telephone Encounter (Signed)
Review call from 5/7   [name] [name]  67 years, Male  DOB: 1956/05/08  M: [phone]  __________________________________________________ General Review [facility])  Chart Review Have there been any documented new, changed, or discontinued medications since last visit?  (Include name, dose, frequency, date):  05/01/2024Aundria [name] (PCP office)- started Evolocumab 140mg  every 14 days. 11/12/2022- [name] [name] (Urgent Care)- started Meloxicam 15mg  once a day for 7 days, started prednisone 40mg  once a day for 5 days.  Has there been any documented recent hospitalizations or ED visits since last visit with Clinical Lead?: No  Adherence Review Does the [facility] have access to medication refill data?: Yes Adherence rates for STAR metric medications: Metformin 500mg - 10/31/2022 90DS, 08/04/2022 90DS Losartan 100mg - 12/02/2022 90DS, 09/16/2022 90DS Trulicity 4.25mg /0.51ml- unknown Adherence rates for medications indicated for disease state being reviewed: HCTZ 25mg - 09/03/2022 90DS, 06/09/2022 90DS Does the patient have >5 day gap between last estimated fill dates for any of the above medications or other medication gaps?: Yes Reasons for medication gaps: HCTZ 25mg - 09/03/2022 90DS, 06/09/2022 90DS  Disease State Questions Able to connect with the Patient?: Yes Did patient have any problems with their health recently?: No Have you had any admissions or emergency room visits or worsening of your condition(s) since last visit?: No Have you had any visits with new specialists or providers since your last visit?: No Have you had any new health care problem(s) since your last visit?: No Have you run out of any of your medications since you last spoke with your clinical lead?: No Are there any medications you are not taking as prescribed?: No Are you having any issues or side effects with your medications?: No Do you have any other health concerns or questions you want to discuss with your Clinical lead  before  your next visit?: No Are there any health concerns that you feel we can do a better job addressing?: No Any falls since last visit?: No Any increased or uncontrolled pain since last visit?: Yes Details: Patient is having chronic right knee pain, was seen at urgent care 11/12/2022 for this issue. Was prescribed meloxicam15mg  once a day and prednisone 20mg  twice a day. Patient states that it helped, and he is requesting refills on those medications. Next visit Type:: Phone Visit with:: CP Date:: 03/24/2023 Time:: 3-5pm Additional Details: Patient is requesting that PCP refill his Meloxicam and Prednisone that was prescribed at Urgent care in March for right knee pain. Patient also states that he missed his Orthopedic appointment and has been marked as a no show and needs that referral redone. [employer] is waiting on information from PCP to send patient a new glucometer at no cost and patient is asking in regard to that as well. I advised patient that I would send a message to PCP addressing all the above and someone would reach out with questions. Was recently started on Repatha, picked that up recently as it needed a PA that was approved. Patient also states the finally seen a dentist and is scheduled to have a deep cleaning done.  Clinical Lead Review Review Adherence gaps identified?: Yes Details: HCTZ 25mg - 09/03/2022 90DS, 06/09/2022 90DS Drug Therapy Problems identified?: No Assessment: New needs identified Plan: CRN will follow up with patient in 1 month CRN Review: (3 min)

## 2023-01-16 DIAGNOSIS — I10 Essential (primary) hypertension: Secondary | ICD-10-CM | POA: Diagnosis not present

## 2023-01-16 DIAGNOSIS — E785 Hyperlipidemia, unspecified: Secondary | ICD-10-CM | POA: Diagnosis not present

## 2023-01-16 DIAGNOSIS — E1169 Type 2 diabetes mellitus with other specified complication: Secondary | ICD-10-CM | POA: Diagnosis not present

## 2023-01-22 ENCOUNTER — Encounter: Payer: Self-pay | Admitting: Internal Medicine

## 2023-02-13 ENCOUNTER — Other Ambulatory Visit: Payer: Self-pay | Admitting: Nurse Practitioner

## 2023-03-26 ENCOUNTER — Other Ambulatory Visit: Payer: Self-pay | Admitting: Nurse Practitioner

## 2023-04-07 ENCOUNTER — Other Ambulatory Visit: Payer: Self-pay | Admitting: Nurse Practitioner

## 2023-04-09 ENCOUNTER — Encounter: Payer: Self-pay | Admitting: Internal Medicine

## 2023-04-09 ENCOUNTER — Ambulatory Visit (INDEPENDENT_AMBULATORY_CARE_PROVIDER_SITE_OTHER): Payer: Medicare HMO | Admitting: Internal Medicine

## 2023-04-09 DIAGNOSIS — E785 Hyperlipidemia, unspecified: Secondary | ICD-10-CM | POA: Diagnosis not present

## 2023-04-09 DIAGNOSIS — E1122 Type 2 diabetes mellitus with diabetic chronic kidney disease: Secondary | ICD-10-CM

## 2023-04-09 DIAGNOSIS — I10 Essential (primary) hypertension: Secondary | ICD-10-CM | POA: Diagnosis not present

## 2023-04-09 DIAGNOSIS — E559 Vitamin D deficiency, unspecified: Secondary | ICD-10-CM | POA: Diagnosis not present

## 2023-04-09 DIAGNOSIS — E1142 Type 2 diabetes mellitus with diabetic polyneuropathy: Secondary | ICD-10-CM

## 2023-04-09 DIAGNOSIS — Z6841 Body Mass Index (BMI) 40.0 and over, adult: Secondary | ICD-10-CM

## 2023-04-09 DIAGNOSIS — Z79899 Other long term (current) drug therapy: Secondary | ICD-10-CM | POA: Diagnosis not present

## 2023-04-09 DIAGNOSIS — N182 Chronic kidney disease, stage 2 (mild): Secondary | ICD-10-CM | POA: Diagnosis not present

## 2023-04-09 DIAGNOSIS — E1169 Type 2 diabetes mellitus with other specified complication: Secondary | ICD-10-CM

## 2023-04-09 DIAGNOSIS — E1165 Type 2 diabetes mellitus with hyperglycemia: Secondary | ICD-10-CM

## 2023-04-09 NOTE — Progress Notes (Signed)
Future Appointments  Date Time Provider Department  05/02/2022                         6 mo ov  10:30 AM Lucky Cowboy, MD [facility]  11/14/2022                         cpe 11:00 AM Lucky Cowboy, MD [facility]    History of Present Illness:       This very nice 67 y.o. single BM presents for 6 month follow up with HTN, HLD, Pre-Diabetes and Vitamin D Deficiency.        Patient is treated for HTN (2007) & BP has been controlled at home. Today's BP is at goal - 124/80 .   Patient has had no complaints of any cardiac type chest pain, palpitations, dyspnea Pollyann Kennedy /PND, dizziness, claudication  or dependent edema.       Hyperlipidemia is not controlled with diet & meds (alleges Statin Intolerance) . Patient denies myalgias or other med SE's. Last Lipids were not at goal:   Lab Results  Component Value Date   CHOL 216 (H) 12/17/2020   HDL 45 12/17/2020   LDLCALC 148 (H) 12/17/2020   TRIG 111 12/17/2020   CHOLHDL 4.8 12/17/2020     Also, the patient has  Morbid Obesity (BMI 43.1+)  history of T2_NIDDM (2014) w/CKD2 (GFR 68)  and has had no symptoms of reactive hypoglycemia, diabetic polys, paresthesias or visual blurring.  Last A1c was not at goal:  Lab Results  Component Value Date   HGBA1C 8.6 (H) 12/17/2020                                                        Further, the patient also has history of Vitamin D Deficiency and supplements vitamin D without any suspected side-effects. Last vitamin D was at goal:   Lab Results  Component Value Date   VD25OH 62 09/13/2020     Current Outpatient Medications on File Prior to Visit  Medication Sig   VIT-C  Take by mouth. 3 times a week   aspirin EC 81 MG tablet Take daily.   VITAMIN D  5000 UNITS  Take 1 tablet daily.   Dulaglutide (TRULICITY) 3 mg Inject 3 mg as directed once a week.   ezetimibe (ZETIA) 10 MG tablet Take 1 tab daily for cholesterol.   ferrous sulfate 325 (65 FE) MG EC  Takes Mondays,  Wednesdays, Fridays   gabapentin  100 MG capsule TAKE 1 CAPSULE THREE TIMES DAILY   glimepiride 4 MG tablet TAKE 1 TABLET TWICE DAILY WITH MEALS    hydrochlorothiazide  25 MG tablet TAKE 1 TABLET DAILY    losartan  100 MG tablet TAKE 1 TABLET EVERY DAY   Magnesium-Zinc  Take 400 mg by mouth daily. Takes 1200 mg daily.   metFORMIN - XR 500 MG  TAKE 2 TABLETS TWICE A DAY WITH MEALS   THERAGRAN-M   Take 1 tablet daily.   Omega-3 FISH OIL  500 MG CAPS Take daily.   phentermine 37.5 MG tablet Take  1 tablet  every Morning     tadalafil 20 MG tablet Take  1/2 to 1 tablet  every 2 to 3  days  if needed for XXXX          Allergies  Allergen Reactions   Crestor [Rosuvastatin Calcium] Other (See Comments)    Cramping     PMHx:   Past Medical History:  Diagnosis Date   Diabetes mellitus without complication (HCC)    Hyperlipidemia, mixed 09/29/2012   Hypertension    Vitamin D deficiency      Immunization History  Administered Date(s) Administered   Pneumococcal-23 02/16/2013   Tdap 02/10/2011     Past Surgical History:  Procedure Laterality Date   CARPAL TUNNEL RELEASE  2008   bilateral   CERVICAL DISCECTOMY  2003   KNEE ARTHROSCOPY  2010   left    FHx:    Reviewed / unchanged  SHx:    Reviewed / unchanged   Systems Review:  Constitutional: Denies fever, chills, wt changes, headaches, insomnia, fatigue, night sweats, change in appetite. Eyes: Denies redness, blurred vision, diplopia, discharge, itchy, watery eyes.  ENT: Denies discharge, congestion, post nasal drip, epistaxis, sore throat, earache, hearing loss, dental pain, tinnitus, vertigo, sinus pain, snoring.  CV: Denies chest pain, palpitations, irregular heartbeat, syncope, dyspnea, diaphoresis, orthopnea, PND, claudication or edema. Respiratory: denies cough, dyspnea, DOE, pleurisy, hoarseness, laryngitis, wheezing.  Gastrointestinal: Denies dysphagia, odynophagia, heartburn, reflux, water brash, abdominal pain or  cramps, nausea, vomiting, bloating, diarrhea, constipation, hematemesis, melena, hematochezia  or hemorrhoids. Genitourinary: Denies dysuria, frequency, urgency, nocturia, hesitancy, discharge, hematuria or flank pain. Musculoskeletal: Denies arthralgias, myalgias, stiffness, jt. swelling, pain, limping or strain/sprain.  Skin: Denies pruritus, rash, hives, warts, acne, eczema or change in skin lesion(s). Neuro: No weakness, tremor, incoordination, spasms, paresthesia or pain. Psychiatric: Denies confusion, memory loss or sensory loss. Endo: Denies change in weight, skin or hair change.  Heme/Lymph: No excessive bleeding, bruising or enlarged lymph nodes.  Physical Exam  BP 124/80   Pulse 95   Temp 97.9 F (36.6 C)   Resp 17   Ht 5' 8.5" (1.74 m)   Wt 271 lb 12.8 oz (123.3 kg)   SpO2 97%   BMI 40.73 kg/m   Appears  over nourished  and in no distress.  Eyes: PERRLA, EOMs, conjunctiva no swelling or erythema. Sinuses: No frontal/maxillary tenderness ENT/Mouth: EAC's clear, TM's nl w/o erythema, bulging. Nares clear w/o erythema, swelling, exudates. Oropharynx clear without erythema or exudates. Oral hygiene is good. Tongue normal, non obstructing. Hearing intact.  Neck: Supple. Thyroid not palpable. Car 2+/2+ without bruits, nodes or JVD. Chest: Respirations nl with BS clear & equal w/o rales, rhonchi, wheezing or stridor.  Cor: Heart sounds normal w/ regular rate and rhythm without sig. murmurs, gallops, clicks or rubs. Peripheral pulses normal and equal  without edema.  Abdomen: Soft & bowel sounds normal. Non-tender w/o guarding, rebound, hernias, masses or organomegaly.  Lymphatics: Unremarkable.  Musculoskeletal: Full ROM all peripheral extremities, joint stability, 5/5 strength and normal gait.  Skin: Warm, dry without exposed rashes, lesions or ecchymosis apparent.  Neuro: Cranial nerves intact, reflexes equal bilaterally. Sensory-motor testing grossly intact. Tendon reflexes  grossly intact.  Pysch: Alert & oriented x 3.  Insight and judgement nl & appropriate. No ideations.  Assessment and Plan:  1. Essential hypertension  - Continue medication, monitor blood pressure at home.  - Continue DASH diet.  Reminder to go to the ER if any CP,  SOB, nausea, dizziness, severe HA, changes vision/speech.   - CBC with Differential/Platelet - COMPLETE METABOLIC PANEL WITH GFR - Magnesium - TSH  2. Hyperlipidemia associated  with type 2 diabetes mellitus (HCC)  - Continue diet/meds, exercise,& lifestyle modifications.  - Continue monitor periodic cholesterol/liver & renal functions    - Lipid panel - TSH  3. Type 2 diabetes mellitus with stage 2 chronic kidney  disease, without long-term current use of insulin (HCC)  - Hemoglobin A1c - Insulin, random  4. Vitamin D deficiency  - Continue diet, exercise  - Lifestyle modifications.  - Monitor appropriate labs. - Continue supplementation.    5. Poorly controlled diabetes mellitus (HCC)  - Hemoglobin A1c - Insulin, random  6. Peripheral sensory neuropathy due to T2_NIDDM  - Hemoglobin A1c - Insulin, random  7. Morbid obesity with BMI of 40.0-44.9, adult (HCC)  - TSH  8. Medication management  - CBC with Differential/Platelet - COMPLETE METABOLIC PANEL WITH GFR - Magnesium - Lipid panel - TSH - Hemoglobin A1c - Insulin, random        Discussed  regular exercise, BP monitoring, weight control to achieve/maintain BMI less than 25 and discussed med and SE's. Recommended labs to assess and monitor clinical status with further disposition pending results of labs.  I discussed the assessment and treatment plan with the patient. The patient was provided an opportunity to ask questions and all were answered. The patient agreed with the plan and demonstrated an understanding of the instructions.  I provided over 30 minutes of exam, counseling, chart review and  complex critical decision  making.   Marinus Maw, MD

## 2023-04-09 NOTE — Patient Instructions (Signed)

## 2023-04-10 LAB — CBC WITH DIFFERENTIAL/PLATELET
Absolute Monocytes: 627 {cells}/uL (ref 200–950)
Basophils Absolute: 50 {cells}/uL (ref 0–200)
Basophils Relative: 0.9 %
Eosinophils Absolute: 248 {cells}/uL (ref 15–500)
Eosinophils Relative: 4.5 %
HCT: 40 % (ref 38.5–50.0)
Hemoglobin: 12.7 g/dL — ABNORMAL LOW (ref 13.2–17.1)
Lymphs Abs: 2217 {cells}/uL (ref 850–3900)
MCH: 26.1 pg — ABNORMAL LOW (ref 27.0–33.0)
MCHC: 31.8 g/dL — ABNORMAL LOW (ref 32.0–36.0)
MCV: 82.1 fL (ref 80.0–100.0)
MPV: 9.4 fL (ref 7.5–12.5)
Monocytes Relative: 11.4 %
Neutro Abs: 2360 {cells}/uL (ref 1500–7800)
Neutrophils Relative %: 42.9 %
Platelets: 352 10*3/uL (ref 140–400)
RBC: 4.87 10*6/uL (ref 4.20–5.80)
RDW: 12.5 % (ref 11.0–15.0)
Total Lymphocyte: 40.3 %
WBC: 5.5 10*3/uL (ref 3.8–10.8)

## 2023-04-10 LAB — COMPLETE METABOLIC PANEL WITH GFR
AG Ratio: 1.1 (calc) (ref 1.0–2.5)
ALT: 7 U/L — ABNORMAL LOW (ref 9–46)
AST: 16 U/L (ref 10–35)
Albumin: 4.2 g/dL (ref 3.6–5.1)
Alkaline phosphatase (APISO): 70 U/L (ref 35–144)
BUN/Creatinine Ratio: 25 (calc) — ABNORMAL HIGH (ref 6–22)
BUN: 30 mg/dL — ABNORMAL HIGH (ref 7–25)
CO2: 27 mmol/L (ref 20–32)
Calcium: 10.1 mg/dL (ref 8.6–10.3)
Chloride: 102 mmol/L (ref 98–110)
Creat: 1.2 mg/dL (ref 0.70–1.35)
Globulin: 3.7 g/dL (ref 1.9–3.7)
Glucose, Bld: 84 mg/dL (ref 65–99)
Potassium: 4.5 mmol/L (ref 3.5–5.3)
Sodium: 138 mmol/L (ref 135–146)
Total Bilirubin: 0.3 mg/dL (ref 0.2–1.2)
Total Protein: 7.9 g/dL (ref 6.1–8.1)
eGFR: 66 mL/min/{1.73_m2} (ref >=60)

## 2023-04-10 LAB — LIPID PANEL
Cholesterol: 121 mg/dL (ref <200)
HDL: 53 mg/dL (ref >=40)
LDL Cholesterol (Calc): 49 mg/dL
Non-HDL Cholesterol (Calc): 68 mg/dL (ref <130)
Total CHOL/HDL Ratio: 2.3 (calc) (ref <5.0)
Triglycerides: 100 mg/dL (ref <150)

## 2023-04-10 LAB — INSULIN, RANDOM: Insulin: 20.6 u[IU]/mL — ABNORMAL HIGH

## 2023-04-10 LAB — HEMOGLOBIN A1C
Hgb A1c MFr Bld: 6.5 %{Hb} — ABNORMAL HIGH (ref <5.7)
Mean Plasma Glucose: 140 mg/dL
eAG (mmol/L): 7.7 mmol/L

## 2023-04-10 LAB — VITAMIN D 25 HYDROXY (VIT D DEFICIENCY, FRACTURES): Vit D, 25-Hydroxy: 105 ng/mL — ABNORMAL HIGH (ref 30–100)

## 2023-04-10 LAB — MAGNESIUM: Magnesium: 1.9 mg/dL (ref 1.5–2.5)

## 2023-04-10 LAB — TSH: TSH: 3.21 m[IU]/L (ref 0.40–4.50)

## 2023-04-11 NOTE — Progress Notes (Signed)
^<^<^<^<^<^<^<^<^<^<^<^<^<^<^<^<^<^<^<^<^<^<^<^<^<^<^<^<^<^<^<^<^<^<^<^<^  ^>^>^>^>^>^>^>^>^>^>^>>^>^>^>^>^>^>^>^>^>^>^>^>^>^>^>^>^>^>^>^>^>^>^>^>^>  -   Kidney functions still Stage  2   - Kidney functions still look a little dehydrated    Very important to drink adequate amounts of fluids to prevent permanent damage    - Recommend drink at least 6 bottles (16 ounces) of fluids /water /day = 96 Oz ~100 oz   - 100 oz = 3,000 cc or 3 liters / day  - >> That's 1 &1/2 bottles of a 2 liter soda bottle /day !   ^<^<^<^<^<^<^<^<^<^<^<^<^<^<^<^<^<^<^<^<^<^<^<^<^<^<^<^<^<^<^<^<^<^<^<^<^  ^>^>^>^>^>^>^>^>^>^>^>^>^>^>^>^>^>^>^>^>^>^>^>^>^>^>^>^>^>^>^>^>^>^>^>^>^   - A1c = 6.5% - Still too High - Goal is less than 5.7% to prevent                                          Diabetic complications as                                                           Strokes, Heart Attack Kidney Failure or Dementia  !   ^<^<^<^<^<^<^<^<^<^<^<^<^<^<^<^<^<^<^<^<^<^<^<^<^<^<^<^<^<^<^<^<^<^<^<^<^  ^>^>^>^>^>^>^>^>^>^>^>^>^>^>^>^>^>^>^>^>^>^>^>^>^>^>^>^>^>^>^>^>^>^>^>^>^   - Vitamin D = 105  - Great - Please keep Dose same   ^<^<^<^<^<^<^<^<^<^<^<^<^<^<^<^<^<^<^<^<^<^<^<^<^<^<^<^<^<^<^<^<^<^<^<^<^  ^>^>^>^>^>^>^>^>^>^>^>^>^>^>^>^>^>^>^>^>^>^>^>^>^>^>^>^>^>^>^>^>^>^>^>^>   -  Chol = 121 - Great  Excellent   ( much better )   - Very low risk for Heart Attack  / Stroke  ^>^>^>^>^>^>^>^>^>^>^>^>^>^>^>^>^>^>^>^>^>^>^>^>^>^>^>^>^>^>^>^>^>^>^>^>^ ^>^>^>^>^>^>^>^>^>^>^>^>^>^>^>^>^>^>^>^>^>^>^>^>^>^>^>^>^>^>^>^>^>^>^>^>^  -  All Else - CBC - Kidneys - Electrolytes - Liver - Magnesium & Thyroid    - all  Normal / OK  ^<^<^<^<^<^<^<^<^<^<^<^<^<^<^<^<^<^<^<^<^<^<^<^<^<^<^<^<^<^<^<^<^<^<^<^<^  ^>^>^>^>^>^>^>^>^>^>^>^>^>^>^>^>^>^>^>^>^>^>^>^>^>^>^>^>^>^>^>^>^>^>^>^>

## 2023-04-12 ENCOUNTER — Encounter: Payer: Self-pay | Admitting: Internal Medicine

## 2023-04-15 ENCOUNTER — Telehealth: Payer: Self-pay

## 2023-04-15 NOTE — Telephone Encounter (Signed)
Patient is not getting all of the Repatha medication from the injections when he gives it to himself. I told him he needs to read the directions again to make sure he is administering it correctly. If the next shot does not go good then we will set up a nurse visit to go over how to administer it correctly. He also said his pinched nerve was hurting more. Dr. Oneta Rack said for him to try extra strength tylenol 500mg  2 tabs, 4 times a day. He is going to start with 3 times a day and see if that helps. If it is not working he will call and make an appointment to see if we need to refer him out.

## 2023-04-29 ENCOUNTER — Encounter: Payer: Self-pay | Admitting: Podiatry

## 2023-04-29 ENCOUNTER — Other Ambulatory Visit: Payer: Self-pay | Admitting: Nurse Practitioner

## 2023-04-29 ENCOUNTER — Ambulatory Visit: Payer: Medicare HMO | Admitting: Podiatry

## 2023-04-29 DIAGNOSIS — M79675 Pain in left toe(s): Secondary | ICD-10-CM

## 2023-04-29 DIAGNOSIS — M79674 Pain in right toe(s): Secondary | ICD-10-CM

## 2023-04-29 DIAGNOSIS — E1122 Type 2 diabetes mellitus with diabetic chronic kidney disease: Secondary | ICD-10-CM | POA: Diagnosis not present

## 2023-04-29 DIAGNOSIS — B351 Tinea unguium: Secondary | ICD-10-CM | POA: Diagnosis not present

## 2023-04-29 DIAGNOSIS — N182 Chronic kidney disease, stage 2 (mild): Secondary | ICD-10-CM

## 2023-04-29 NOTE — Progress Notes (Signed)
This patient presents to the office with chief complaint of long thick nails and diabetic feet.  This patient  says there  is  no pain and discomfort in their feet.  This patient says there are long thick painful bif toe nails.  These nails are painful walking and wearing shoes.  Patient has no history of infection or drainage from both feet.  Patient is unable to  self treat his own nails . This patient presents  to the office today for treatment of the  long nails and a foot evaluation due to history of  diabetes.  General Appearance  Alert, conversant and in no acute stress.  Vascular  Dorsalis pedis and posterior tibial  pulses are palpable  bilaterally.  Capillary return is within normal limits  bilaterally. Temperature is within normal limits  bilaterally.  Neurologic  Senn-Weinstein monofilament wire test within normal limits  bilaterally. Muscle power within normal limits bilaterally.  Nails Thick disfigured discolored nails with subungual debris  from hallux to fifth toes bilaterally. No evidence of bacterial infection or drainage bilaterally.  Orthopedic  No limitations of motion of motion feet .  No crepitus or effusions noted.  No bony pathology or digital deformities noted.  Skin  normotropic skin with no porokeratosis noted bilaterally.  No signs of infections or ulcers noted.     Onychomycosis  Diabetes with no foot complications  IE  Debride nails x 10.  A diabetic foot exam was performed and there is no evidence of any vascular or neurologic pathology.   RTC 4  months.   Helane Gunther DPM

## 2023-05-04 ENCOUNTER — Other Ambulatory Visit: Payer: Self-pay

## 2023-05-04 ENCOUNTER — Encounter (HOSPITAL_COMMUNITY): Payer: Self-pay | Admitting: Emergency Medicine

## 2023-05-04 ENCOUNTER — Ambulatory Visit (INDEPENDENT_AMBULATORY_CARE_PROVIDER_SITE_OTHER): Payer: Medicare HMO

## 2023-05-04 ENCOUNTER — Ambulatory Visit (HOSPITAL_COMMUNITY)
Admission: EM | Admit: 2023-05-04 | Discharge: 2023-05-04 | Disposition: A | Discharge status: Home / Self Care | Payer: Medicare HMO | Attending: Family Medicine | Admitting: Family Medicine

## 2023-05-04 DIAGNOSIS — M25511 Pain in right shoulder: Secondary | ICD-10-CM | POA: Diagnosis not present

## 2023-05-04 DIAGNOSIS — S43401A Unspecified sprain of right shoulder joint, initial encounter: Secondary | ICD-10-CM | POA: Diagnosis not present

## 2023-05-04 DIAGNOSIS — M19011 Primary osteoarthritis, right shoulder: Secondary | ICD-10-CM | POA: Diagnosis not present

## 2023-05-04 DIAGNOSIS — R000 Tachycardia, unspecified: Secondary | ICD-10-CM

## 2023-05-04 DIAGNOSIS — M25711 Osteophyte, right shoulder: Secondary | ICD-10-CM | POA: Diagnosis not present

## 2023-05-04 LAB — CBC
HCT: 42.4 % (ref 39.0–52.0)
Hemoglobin: 13.7 g/dL (ref 13.0–17.0)
MCH: 26.4 pg (ref 26.0–34.0)
MCHC: 32.3 g/dL (ref 30.0–36.0)
MCV: 81.7 fL (ref 80.0–100.0)
Platelets: 350 10*3/uL (ref 150–400)
RBC: 5.19 MIL/uL (ref 4.22–5.81)
RDW: 13.5 % (ref 11.5–15.5)
WBC: 9.5 10*3/uL (ref 4.0–10.5)
nRBC: 0 % (ref 0.0–0.2)

## 2023-05-04 LAB — COMPREHENSIVE METABOLIC PANEL
ALT: 12 U/L (ref 0–44)
AST: 21 U/L (ref 15–41)
Albumin: 3.9 g/dL (ref 3.5–5.0)
Alkaline Phosphatase: 75 U/L (ref 38–126)
Anion gap: 12 (ref 5–15)
BUN: 19 mg/dL (ref 8–23)
CO2: 25 mmol/L (ref 22–32)
Calcium: 9.3 mg/dL (ref 8.9–10.3)
Chloride: 98 mmol/L (ref 98–111)
Creatinine, Ser: 1.09 mg/dL (ref 0.61–1.24)
GFR, Estimated: >60 mL/min (ref >60)
Glucose, Bld: 100 mg/dL — ABNORMAL HIGH (ref 70–99)
Potassium: 4 mmol/L (ref 3.5–5.1)
Sodium: 135 mmol/L (ref 135–145)
Total Bilirubin: 0.7 mg/dL (ref 0.3–1.2)
Total Protein: 8.4 g/dL — ABNORMAL HIGH (ref 6.5–8.1)

## 2023-05-04 LAB — TSH: TSH: 1.669 u[IU]/mL (ref 0.350–4.500)

## 2023-05-04 MED ORDER — LIDOCAINE 5 % EX OINT
1.0000 | TOPICAL_OINTMENT | CUTANEOUS | 0 refills | Status: AC | PRN
Start: 2023-05-04 — End: ?

## 2023-05-04 MED ORDER — MELOXICAM 15 MG PO TABS
15.0000 mg | ORAL_TABLET | Freq: Every day | ORAL | 0 refills | Status: AC
Start: 2023-05-04 — End: ?

## 2023-05-04 NOTE — ED Triage Notes (Signed)
Reports having gradual right shoulder pain x 5 days .  Now pain is so significant that he cannot lift right arm over head.    "I have to go to work tomorrow.. can't go back"  Has taken tylenol-states he has a pinched nerve in back, has used icy hot

## 2023-05-04 NOTE — ED Provider Notes (Signed)
[facility]    CSN: [id number] Arrival date & time: 05/04/23  1145      History   Chief Complaint Chief Complaint  Patient presents with   Shoulder Pain    HPI [name] [name] is a 67 y.o. male.   Patient presents today with several concerns.  His primary concern today is right shoulder pain.  Reports that approximately 5 days ago he was lifting a heavy box at work when he felt a pain in his shoulder.  This was minimal at first but then increased overnight and is now rated 10 on a 0-10 pain scale, described as sharp, worse with certain activities, no alleviating factors identified.  He denies previous injury or surgery involving his hand.  He is right-handed.  He is having difficulty with daily duties including sleeping at night as result of the pain.  He has tried Tylenol as well as IcyHot without improvement of symptoms.  Patient was noted to be mildly tachycardic on intake.  He denies any chest pain, shortness of breath, lightheadedness, near syncope.  Does report he has had occasional palpitations.  Denies history of tachycardia or arrhythmia.  Denies increased caffeine consumption.  Denies any history of thyroid disease or known anemia.    Past Medical History:  Diagnosis Date   Diabetes mellitus without complication (HCC)    Hyperlipidemia, mixed 09/29/2012   Hypertension    Vitamin D deficiency     Patient Active Problem List   Diagnosis Date Noted   Pain due to onychomycosis of toenails of both feet 04/29/2023   Statin myopathy 12/06/2021   CKD stage 2 due to type 2 diabetes mellitus (HCC) 11/16/2018   Arthritis of right knee 05/06/2018   Gluttony 07/19/2017   Chronic lower back pain 04/16/2017   Aneurysm artery, iliac common (HCC) 01/23/2015   Chronic pain syndrome 01/23/2015   Peripheral sensory neuropathy due to T2_NIDDM 01/23/2015   Erectile dysfunction associated with type 2 diabetes mellitus (HCC) 01/23/2015   T2_NIDDM w/ Stage 2 CKD (HCC)  06/06/2014   Medication management 06/06/2014   Vitamin D deficiency    HTN (hypertension) 09/29/2012   Hyperlipidemia associated with type 2 diabetes mellitus (HCC) 09/29/2012   GERD (gastroesophageal reflux disease) 09/29/2012   Morbid obesity (BMI 40+) 09/29/2012    Past Surgical History:  Procedure Laterality Date   CARPAL TUNNEL RELEASE  2008   bilateral   CERVICAL DISCECTOMY  2003   KNEE ARTHROSCOPY  2010   left       Home Medications    Prior to Admission medications   Medication Sig Start Date End Date Taking? Authorizing Provider  lidocaine (XYLOCAINE) 5 % ointment Apply 1 Application topically as needed. 05/04/23  Yes Gerilynn Mccullars, Noberto Retort, PA-C  meloxicam (MOBIC) 15 MG tablet Take 1 tablet (15 mg total) by mouth daily. 05/04/23  Yes Yovanny Coats, Noberto Retort, PA-C  Alcohol Swabs (DROPSAFE ALCOHOL PREP) 70 % PADS USE TO CHECK BLOOD SUGAR 1 TIME DAILY 04/15/21   Raynelle Dick, NP  Ascorbic Acid (VITA-C PO) Take by mouth. 3 times a week    [provider]  aspirin EC 81 MG tablet Take 81 mg by mouth daily.    [provider]  Blood Glucose Calibration (TRUE METRIX LEVEL 1) Low SOLN Use with glucometer. Check blood sugar 1 time daily-E11.2. 03/30/20   Lucky Cowboy, MD  Blood Glucose Monitoring Suppl (TRUE METRIX METER) w/Device KIT Check blood sugar 1 time daily- E11.2 04/02/20   Oneta Rack,  Chrissie Noa, MD  Cholecalciferol (VITAMIN D-3) 5000 UNITS TABS Take 1 tablet by mouth daily.    [provider]  Evolocumab (REPATHA SURECLICK) 140 MG/ML SOAJ Inject 140 mg into the skin every 14 (fourteen) days. 12/17/22   Raynelle Dick, NP  Evolocumab (REPATHA SURECLICK) 140 MG/ML SOAJ Inject 140 mg into the skin every 14 (fourteen) days. 12/17/22   Raynelle Dick, NP  ferrous sulfate 325 (65 FE) MG EC tablet Take 325 mg by mouth 3 (three) times daily with meals. Takes Mondays, Wednesdays, Fidays    [provider]  gabapentin (NEURONTIN) 100 MG capsule TAKE 1 CAPSULE  THREE TIMES DAILY FOR NEUROPATHY PAINS IN HANDS Patient not taking: Reported on 05/04/2023 04/07/23   Raynelle Dick, NP  glucose blood (TRUE METRIX BLOOD GLUCOSE TEST) test strip CHECK BLOOD SUGAR 1 TIME DAILY 04/15/22   Raynelle Dick, NP  hydrochlorothiazide (HYDRODIURIL) 25 MG tablet TAKE 1 TABLET DAILY FOR BLOOD PRESSURE AND FLUID 06/09/22   Raynelle Dick, NP  losartan (COZAAR) 100 MG tablet Take 1 TABLET  Daily FOR BP & DIABETIC Kidney Protection 09/15/22   Lucky Cowboy, MD  Magnesium-Zinc (MAGNESIUM-CHELATED ZINC PO) Take 400 mg by mouth daily. Takes 1200 mg daily.    [provider]  metFORMIN (GLUCOPHAGE-XR) 500 MG 24 hr tablet TAKE 2 TABLETS TWICE A DAY WITH MEALS FOR DIABETES 03/26/23   Raynelle Dick, NP  Multiple Vitamins-Minerals (THERAGRAN-M PREMIER 50 PLUS PO) Take 1 tablet by mouth daily.    [provider]  Omega-3 Fatty Acids (FISH OIL) 500 MG CAPS Take by mouth daily.    [provider]  phentermine (ADIPEX-P) 37.5 MG tablet Take  1 tablet every morning for Dieting & Weight loss                                                    /                                                                   TAKE                                         BY                                                 MOUTH 04/29/23   Lucky Cowboy, MD  tadalafil (CIALIS) 20 MG tablet Take  1/2 to 1 tablet  every 2 to 3 days  if needede for XXXX                              /          TAKE  BY                  MOUTH 03/02/22   Lucky Cowboy, MD  TRUEplus Lancets 30G MISC CHECK BLOOD SUGAR 1 TIME DAILY 04/15/22   Raynelle Dick, NP  TRULICITY 4.5 MG/0.5ML SOPN Inject 1 pen  (4.5 mg) into Skin every 7 days for Diabetes   (e11.29) 07/08/22   Lucky Cowboy, MD    Family History Family History  Problem Relation Age of Onset   Heart disease Mother    Stroke Mother    Diabetes Mother    Diabetes Father    Hypertension  Father    Diabetes Sister    Kidney disease Sister    Colon cancer Neg Hx    Stomach cancer Neg Hx     Social History Social History   Tobacco Use   Smoking status: Former    Current packs/day: 0.00    Types: Cigarettes    Quit date: 02/25/1991    Years since quitting: 32.2   Smokeless tobacco: Never  Vaping Use   Vaping status: Never Used  Substance Use Topics   Alcohol use: Not Currently   Drug use: No     Allergies   Crestor [rosuvastatin calcium] and Simvastatin   Review of Systems Review of Systems  Constitutional:  Positive for activity change. Negative for appetite change, fatigue and fever.  Respiratory:  Negative for cough and shortness of breath.   Cardiovascular:  Positive for palpitations. Negative for chest pain and leg swelling.  Gastrointestinal:  Negative for abdominal pain, diarrhea, nausea and vomiting.  Musculoskeletal:  Positive for arthralgias. Negative for joint swelling and myalgias.  Neurological:  Negative for dizziness, weakness, light-headedness and numbness.     Physical Exam Triage Vital Signs ED Triage Vitals  Encounter Vitals Group     BP 05/04/23 1307 (!) 147/87     Systolic BP Percentile --      Diastolic BP Percentile --      Pulse Rate 05/04/23 1307 (!) 109     Resp 05/04/23 1307 20     Temp 05/04/23 1307 98.8 F (37.1 C)     Temp Source 05/04/23 1307 Oral     SpO2 05/04/23 1307 95 %     Weight --      Height --      Head Circumference --      Peak Flow --      Pain Score 05/04/23 1304 10     Pain Loc --      Pain Education --      Exclude from Growth Chart --    No data found.  Updated Vital Signs BP (!) 147/87 (BP Location: Left Arm) Comment (BP Location): large cuff  Pulse (!) 109   Temp 98.8 F (37.1 C) (Oral)   Resp 20   SpO2 95%   Visual Acuity Right Eye Distance:   Left Eye Distance:   Bilateral Distance:    Right Eye Near:   Left Eye Near:    Bilateral Near:     Physical Exam Vitals reviewed.   Constitutional:      General: He is awake.     Appearance: Normal appearance. He is well-developed. He is not ill-appearing.     Comments: Very pleasant male appears stated age in no acute distress sitting comfortably in exam room  HENT:     Head: Normocephalic and atraumatic.  Cardiovascular:     Rate and Rhythm: Regular rhythm. Tachycardia present.  Heart sounds: Normal heart sounds, S1 normal and S2 normal. No murmur heard. Pulmonary:     Effort: Pulmonary effort is normal.     Breath sounds: Normal breath sounds. No stridor. No wheezing, rhonchi or rales.     Comments: Clear to auscultation bilaterally Musculoskeletal:     Right shoulder: Tenderness present. No swelling or bony tenderness. Decreased range of motion. Normal strength.     Comments: Right shoulder: Tender to palpation over AC joint.  No deformity noted.  Strength 4/5 right upper extremity.  Patient unable to perform special tests including Hawking's, Apley scratch, empty can secondary to pain.  Hand is neurovascularly intact.  Neurological:     Mental Status: He is alert.  Psychiatric:        Behavior: Behavior is cooperative.      UC Treatments / Results  Labs (all labs ordered are listed, but only abnormal results are displayed) Labs Reviewed  COMPREHENSIVE METABOLIC PANEL  TSH  CBC    EKG   Radiology No results found.  Procedures Procedures (including critical care time)  Medications Ordered in UC Medications - No data to display  Initial Impression / Assessment and Plan / UC Course  I have reviewed the triage vital signs and the nursing notes.  Pertinent labs & imaging results that were available during my care of the patient were reviewed by me and considered in my medical decision making (see chart for details).     Patient is well-appearing, afebrile, nontoxic.  X-ray was obtained given focal tenderness over the [facility] joint that showed no acute osseous abnormality based on my primary  read.  At the time of discharge I was being on radiologist over read and we will contact him if this is different and changes our treatment plan.  Given his clinical presentation I am more concerned for rotator cuff pathology.  Recommended that he avoid strenuous activity including heavy lifting and follow-up closely with orthopedics.  He was given contact information for local provider with instruction to call to schedule an appointment as soon as possible.  He was started on Mobic 15 mg daily.  No indication for dose adjustment based on metabolic panel from 04/09/2023 with creatinine of 1.2 and calculated creatinine clearance of 103.86 mL/min.  He was also given lidocaine topical gel to apply to specific bothersome areas.  Discussed that if anything worsens or changes he should return for reevaluation.  Work excuse note provided.  Patient was noted to be tachycardic on intake and on recheck.  He denies any chest pain, shortness of breath, lightheadedness, near syncope.  EKG was obtained given his tachycardia that showed sinus tachycardia with ventricular rate of 108 bpm without ischemic changes; compared to 12/17/2022 tracing sinus tachycardia replaces normal sinus rhythm but no other significant change.  I suspect that this is more related to pain but will obtain basic blood work including CBC, CMP, thyroid to investigate this further.  Recommended that he follow-up with his primary care within the week for recheck and discussed that if he develops any worsening symptoms including chest pain, shortness of breath, palpitations, lightheadedness, weakness, near syncope, syncopal episode he needs to go to the emergency room immediately.  Strict return precautions given.  Final Clinical Impressions(s) / UC Diagnoses   Final diagnoses:  Sprain of right shoulder, unspecified shoulder sprain type, initial encounter  Acute pain of right shoulder  Tachycardia     Discharge Instructions      I believe that you  have  injured your rotator cuff.  Please follow-up with orthopedics.  Call to schedule an appointment with them.  I will call you if your x-ray is abnormal.  Take Mobic daily.  Do not take NSAIDs with this medication including aspirin, ibuprofen/Advil, naproxen/Aleve; you can take your 81 aspirin daily but should not take additional aspirin.  Apply lidocaine ointment to help with pain relief.  If anything worsens or changes please be seen immediately.  Your EKG was normal but did show your heart rate is a little bit high.  I suspect this is because of pain.  Please follow-up with your primary care later this week for recheck.  If you develop any chest pain, shortness of breath, heart racing, lightheadedness, feeling like you are going to pass out you need to go to the emergency room immediately.  They will call you if your workup is abnormal.     ED Prescriptions     Medication Sig Dispense Auth. Provider   meloxicam (MOBIC) 15 MG tablet Take 1 tablet (15 mg total) by mouth daily. 30 tablet Arnesha Schiraldi K, PA-C   lidocaine (XYLOCAINE) 5 % ointment Apply 1 Application topically as needed. 50 g Terryl Niziolek K, PA-C      PDMP not reviewed this encounter.   Jeani Hawking, PA-C 05/04/23 1534

## 2023-05-04 NOTE — Discharge Instructions (Signed)
I believe that you have injured your rotator cuff.  Please follow-up with orthopedics.  Call to schedule an appointment with them.  I will call you if your x-ray is abnormal.  Take Mobic daily.  Do not take NSAIDs with this medication including aspirin, ibuprofen/Advil, naproxen/Aleve; you can take your 81 aspirin daily but should not take additional aspirin.  Apply lidocaine ointment to help with pain relief.  If anything worsens or changes please be seen immediately.  Your EKG was normal but did show your heart rate is a little bit high.  I suspect this is because of pain.  Please follow-up with your primary care later this week for recheck.  If you develop any chest pain, shortness of breath, heart racing, lightheadedness, feeling like you are going to pass out you need to go to the emergency room immediately.  They will call you if your workup is abnormal.

## 2023-05-04 NOTE — ED Notes (Signed)
Patient stopped taking gabapentin last week when he started taking tylenol-without pcp advice

## 2023-05-08 DIAGNOSIS — M25511 Pain in right shoulder: Secondary | ICD-10-CM | POA: Diagnosis not present

## 2023-05-21 ENCOUNTER — Telehealth: Payer: Self-pay | Admitting: Nurse Practitioner

## 2023-05-21 ENCOUNTER — Encounter: Payer: Self-pay | Admitting: Nurse Practitioner

## 2023-05-21 ENCOUNTER — Ambulatory Visit: Payer: Medicare HMO | Admitting: Nurse Practitioner

## 2023-05-21 ENCOUNTER — Telehealth: Payer: Self-pay

## 2023-05-21 ENCOUNTER — Other Ambulatory Visit: Payer: Self-pay | Admitting: Nurse Practitioner

## 2023-05-21 VITALS — BP 108/70 | HR 101 | Temp 97.7°F | Ht 68.0 in | Wt 282.4 lb

## 2023-05-21 DIAGNOSIS — R202 Paresthesia of skin: Secondary | ICD-10-CM

## 2023-05-21 DIAGNOSIS — N182 Chronic kidney disease, stage 2 (mild): Secondary | ICD-10-CM | POA: Diagnosis not present

## 2023-05-21 DIAGNOSIS — R2689 Other abnormalities of gait and mobility: Secondary | ICD-10-CM | POA: Diagnosis not present

## 2023-05-21 DIAGNOSIS — E1169 Type 2 diabetes mellitus with other specified complication: Secondary | ICD-10-CM

## 2023-05-21 DIAGNOSIS — E1122 Type 2 diabetes mellitus with diabetic chronic kidney disease: Secondary | ICD-10-CM | POA: Diagnosis not present

## 2023-05-21 DIAGNOSIS — R200 Anesthesia of skin: Secondary | ICD-10-CM | POA: Diagnosis not present

## 2023-05-21 DIAGNOSIS — T466X5A Adverse effect of antihyperlipidemic and antiarteriosclerotic drugs, initial encounter: Secondary | ICD-10-CM

## 2023-05-21 DIAGNOSIS — G720 Drug-induced myopathy: Secondary | ICD-10-CM

## 2023-05-21 DIAGNOSIS — M5442 Lumbago with sciatica, left side: Secondary | ICD-10-CM

## 2023-05-21 MED ORDER — PREDNISONE 10 MG PO TABS
ORAL_TABLET | ORAL | 0 refills | Status: DC
Start: 2023-05-21 — End: 2023-07-24

## 2023-05-21 MED ORDER — NEXLETOL 180 MG PO TABS: 1.0000 | ORAL_TABLET | Freq: Every day | ORAL | 3 refills | Status: AC

## 2023-05-21 NOTE — Telephone Encounter (Signed)
Can try Nexletol but may also be expensive. I sent in a prescription but will probably need a prior auth which my nurse will complete when it is received.

## 2023-05-21 NOTE — Telephone Encounter (Signed)
Prior auth approved. Will let patient know.

## 2023-05-21 NOTE — Telephone Encounter (Signed)
Pt says Repatha sureclick is too expensive. He is wondering if there are cheaper alternatives. If so, pharm is [facility] [phone] [place], [place] - [address]

## 2023-05-21 NOTE — Progress Notes (Signed)
Assessment and Plan:  [name] [name] was seen today for an episodic visit.  Diagnoses and all order for this visit:  Low back pain with neuralgia of left sciatic nerve Start steroid taper - monitor BG. Will obtain updated lumbar x-ray considering hx of Grade 1 anterolisthesis of L3 on L4.  - DG Lumbar Spine Complete; Future - predniSONE (DELTASONE) 10 MG tablet; 1 tab 3 x day for 2 days, then 1 tab 2 x day for 2 days, then 1 tab 1 x day for 3 days  Dispense: 13 tablet; Refill: 0  Balance problem  - DG Lumbar Spine Complete; Future  Numbness and tingling  - DG Lumbar Spine Complete; Future  Notify office for further evaluation and treatment, questions or concerns if s/s fail to improve. The risks and benefits of my recommendations, as well as other treatment options were discussed with the patient today. Questions were answered.  Further disposition pending results of labs. Discussed med's effects and SE's.    Over 20 minutes of exam, counseling, chart review, and critical decision making was performed.   Future Appointments  Date Time Provider Department Center  07/24/2023 10:30 AM Raynelle Dick, NP [facility] None  08/27/2023  3:45 PM Helane Gunther, DPM [facility]  12/17/2023  3:00 PM Raynelle Dick, NP [facility] None    ------------------------------------------------------------------------------------------------------------------   HPI BP 108/70   Pulse (!) 101   Temp 97.7 F (36.5 C)   Ht 5\' 8"  (1.727 m)   Wt 282 lb 6.4 oz (128.1 kg)   SpO2 98%   BMI 42.94 kg/m   67 y.o.male presents for evalaution of lower back pain that radiates down left leg.  Feels as though this is affecting his balance.  Associated numbness and tingling.  Most effected when standing, sitting, or walking for long periods of time. He does have a hx of DM currently being treated with Trulicity.  .  He does take Gabapentin 100 mg TID along with Mobic 15 mg with some  effectiveness.  Does cause drowsiness.  Also uses lidocaine ointment.  Denies any recent fall, trauma, injury.    Last review of lumbar x-ray 03/26/21. There is grade 1 anterolisthesis of L3 on L4, new since 2008. Alignment is otherwise normal.  There is disc space narrowing and degenerative endplate change at L4-S1. The remaining disc spaces are preserved. The SI joints are normal. The soft tissues are unremarkable.  BMI is Body mass index is 42.94 kg/m., he has been working on diet and exercise and has recently gained weight which could be contributing for increase in back pain. Wt Readings from Last 3 Encounters:  05/21/23 282 lb 6.4 oz (128.1 kg)  04/09/23 271 lb 12.8 oz (123.3 kg)  12/17/22 275 lb (124.7 kg)   His A1c is well controlled currently on Trulicity.   Lab Results  Component Value Date   HGBA1C 6.5 (H) 04/09/2023    Past Medical History:  Diagnosis Date   Diabetes mellitus without complication (HCC)    Hyperlipidemia, mixed 09/29/2012   Hypertension    Vitamin D deficiency      Allergies  Allergen Reactions   Crestor [Rosuvastatin Calcium] Other (See Comments)    Cramping   Simvastatin Other (See Comments)    Muscle cramps    Current Outpatient Medications on File Prior to Visit  Medication Sig   Alcohol Swabs (DROPSAFE ALCOHOL PREP) 70 % PADS USE TO CHECK BLOOD SUGAR 1 TIME DAILY   Ascorbic Acid (VITA-C PO)  Take by mouth. 3 times a week   aspirin EC 81 MG tablet Take 81 mg by mouth daily.   Bempedoic Acid (NEXLETOL) 180 MG TABS Take 1 tablet (180 mg total) by mouth daily.   Blood Glucose Calibration (TRUE METRIX LEVEL 1) Low SOLN Use with glucometer. Check blood sugar 1 time daily-E11.2.   Blood Glucose Monitoring Suppl (TRUE METRIX METER) w/Device KIT Check blood sugar 1 time daily- E11.2   Cholecalciferol (VITAMIN D-3) 5000 UNITS TABS Take 1 tablet by mouth daily.   ferrous sulfate 325 (65 FE) MG EC tablet Take 325 mg by mouth 3 (three) times daily with  meals. Takes Mondays, Wednesdays, Fidays   gabapentin (NEURONTIN) 100 MG capsule TAKE 1 CAPSULE THREE TIMES DAILY FOR NEUROPATHY PAINS IN HANDS   glucose blood (TRUE METRIX BLOOD GLUCOSE TEST) test strip CHECK BLOOD SUGAR 1 TIME DAILY   hydrochlorothiazide (HYDRODIURIL) 25 MG tablet TAKE 1 TABLET DAILY FOR BLOOD PRESSURE AND FLUID   lidocaine (XYLOCAINE) 5 % ointment Apply 1 Application topically as needed.   losartan (COZAAR) 100 MG tablet Take 1 TABLET  Daily FOR BP & DIABETIC Kidney Protection   Magnesium-Zinc (MAGNESIUM-CHELATED ZINC PO) Take 400 mg by mouth daily. Takes 1200 mg daily.   meloxicam (MOBIC) 15 MG tablet Take 1 tablet (15 mg total) by mouth daily.   metFORMIN (GLUCOPHAGE-XR) 500 MG 24 hr tablet TAKE 2 TABLETS TWICE A DAY WITH MEALS FOR DIABETES   Multiple Vitamins-Minerals (THERAGRAN-M PREMIER 50 PLUS PO) Take 1 tablet by mouth daily.   Omega-3 Fatty Acids (FISH OIL) 500 MG CAPS Take by mouth daily.   phentermine (ADIPEX-P) 37.5 MG tablet Take  1 tablet every morning for Dieting & Weight loss                                                    /                                                                   TAKE                                         BY                                                 MOUTH   tadalafil (CIALIS) 20 MG tablet Take  1/2 to 1 tablet  every 2 to 3 days  if needede for XXXX                              /          TAKE  BY                  MOUTH   TRUEplus Lancets 30G MISC CHECK BLOOD SUGAR 1 TIME DAILY   TRULICITY 4.5 MG/0.5ML SOPN Inject 1 pen  (4.5 mg) into Skin every 7 days for Diabetes   (e11.29)   Evolocumab (REPATHA SURECLICK) 140 MG/ML SOAJ Inject 140 mg into the skin every 14 (fourteen) days. (Patient not taking: Reported on 05/21/2023)   Evolocumab (REPATHA SURECLICK) 140 MG/ML SOAJ Inject 140 mg into the skin every 14 (fourteen) days. (Patient not taking: Reported on 05/21/2023)   No current  facility-administered medications on file prior to visit.    ROS: all negative except what is noted in the HPI.   Physical Exam:  BP 108/70   Pulse (!) 101   Temp 97.7 F (36.5 C)   Ht 5\' 8"  (1.727 m)   Wt 282 lb 6.4 oz (128.1 kg)   SpO2 98%   BMI 42.94 kg/m   General Appearance: NAD.  Awake, conversant and cooperative. Eyes: PERRLA, EOMs intact.  Sclera white.  Conjunctiva without erythema. Sinuses: No frontal/maxillary tenderness.  No nasal discharge. Nares patent.  ENT/Mouth: Ext aud canals clear.  Bilateral TMs w/DOL and without erythema or bulging. Hearing intact.  Posterior pharynx without swelling or exudate.  Tonsils without swelling or erythema.  Neck: Supple.  No masses, nodules or thyromegaly. Respiratory: Effort is regular with non-labored breathing. Breath sounds are equal bilaterally without rales, rhonchi, wheezing or stridor.  Cardio: RRR with no MRGs. Brisk peripheral pulses without edema.  Abdomen: Active BS in all four quadrants.  Soft and non-tender without guarding, rebound tenderness, hernias or masses. Lymphatics: Non tender without lymphadenopathy.  Musculoskeletal: Full ROM, 5/5 strength, normal ambulation.  No clubbing or cyanosis. Skin: Appropriate color for ethnicity. Warm without rashes, lesions, ecchymosis, ulcers.  Neuro: CN II-XII grossly normal. Normal muscle tone without cerebellar symptoms and intact sensation.   Psych: AO X 3,  appropriate mood and affect, insight and judgment.     Adela Glimpse, NP 1:58 PM [facility]

## 2023-05-21 NOTE — Telephone Encounter (Signed)
Prior auth completed and submitted. 

## 2023-05-22 ENCOUNTER — Ambulatory Visit
Admission: RE | Admit: 2023-05-22 | Discharge: 2023-05-22 | Disposition: A | Discharge status: Home / Self Care | Payer: Medicare HMO | Source: Ambulatory Visit | Attending: Nurse Practitioner | Admitting: Nurse Practitioner

## 2023-05-22 DIAGNOSIS — M47816 Spondylosis without myelopathy or radiculopathy, lumbar region: Secondary | ICD-10-CM | POA: Diagnosis not present

## 2023-05-22 DIAGNOSIS — M5442 Lumbago with sciatica, left side: Secondary | ICD-10-CM

## 2023-05-22 DIAGNOSIS — R200 Anesthesia of skin: Secondary | ICD-10-CM

## 2023-05-22 DIAGNOSIS — M4316 Spondylolisthesis, lumbar region: Secondary | ICD-10-CM | POA: Diagnosis not present

## 2023-05-22 DIAGNOSIS — R2689 Other abnormalities of gait and mobility: Secondary | ICD-10-CM

## 2023-05-28 NOTE — Patient Instructions (Signed)
Spondylolysis  Spondylolysis is a small break or crack (stress fracture) in a bone in the spine (vertebra) in the lower back (lumbar spine). The stress fracture occurs on the bony mass between and behind the vertebra. Spondylolysis may be caused by an injury (trauma) or by overuse. Since the lower back is almost always under pressure from daily living, this stress fracture usually does not heal normally. Spondylolysis may eventually cause one vertebra to slip forward and out of place (spondylolisthesis). What are the causes? This condition may be caused by: Trauma, such as a fall. Excessive wear and tear. This often results from doing sports or physical activities that involve repetitive overstretching and rotation of the spine. What increases the risk? You are more likely to develop this condition if you have: A family history of this condition. An inward curvature of your spine (lordosis). A condition that affects your spine, such as spina bifida. You are more likely to develop this condition if you participate in: Gymnastics or dance. Football. Wrestling or martial arts. Weight lifting. Tennis. Swimming. What are the signs or symptoms? Symptoms of this condition may include: Long-lasting (chronic) pain in the lower back. Stiffness in the back or legs. Tightness in the backs of the thighs (hamstring muscles). In some cases, there may be no symptoms. How is this diagnosed? This condition may be diagnosed based on: Your symptoms. Your medical history. A physical exam. Imaging tests, such as: X-rays. A CT scan. An MRI. How is this treated? This condition may be treated with: Rest. You may be asked to avoid or change activities that put strain on your back until your symptoms improve. Pain medicines, or medicines such as NSAIDs to help with swelling and discomfort. Injections of steroid medicine in your back to relieve pain, swelling, and numbness. A brace to stabilize and  support your back. Physical therapy. You may work with an occupational therapist or physical therapist who can teach you how to reduce pressure on your back while you do activities. Surgery. This may be needed if you have: A severe injury. Pain that lasts for more than 6 months. Numbness in your pelvic region. Changes in your ability to control your bladder or bowels. Follow these instructions at home: Medicines Take over-the-counter and prescription medicines only as told by your health care provider. Ask your health care provider if the medicine prescribed to you: Requires you to avoid driving or using heavy machinery. Can cause constipation. You may need to take these actions to prevent or treat constipation: Drink enough fluid to keep your urine pale yellow. Take over-the-counter or prescription medicines. Eat foods that are high in fiber, such as beans, whole grains, and fresh fruits and vegetables. Limit foods that are high in fat and processed sugars, such as fried or sweet foods. If you have a removable brace: Wear the brace as told by your health care provider. Remove it only as told by your health care provider. Keep the brace clean. If the brace is not waterproof: Do not let it get wet. Cover it with a watertight covering when you take a bath or a shower. Activity Rest and return to your normal activities as told by your health care provider. Ask your health care provider what activities are safe for you. Ask your health care provider when it is safe to drive if you have a back brace. Work with a physical therapist to make a safe exercise program as told by your health care provider. Do exercises as told  by your physical therapist. This may include exercises to strengthen your back and abdominal muscles (core exercises). Managing pain, stiffness, and swelling     If directed, put ice on the affected area. If you have a removable brace, remove it as told by your health care  provider. Put ice in a plastic bag. Place a towel between your skin and the bag. Leave the ice on for 20 minutes, 2-3 times a day. If your skin turns bright red, remove the ice right away to prevent skin damage. The risk of skin damage is higher if you cannot feel pain, heat, or cold. If directed, apply heat to the affected area as often as told by your health care provider. Use the heat source that your health care provider recommends, such as a moist heat pack or a heating pad. If you have a removable brace, remove it as told by your health care provider. Place a towel between your skin and the heat source. Leave the heat on for 20-30 minutes. If your skin turns bright red, remove the heat right away to prevent burns. The risk of burns is higher if you cannot feel pain, heat, or cold. General instructions Do not use any products that contain nicotine or tobacco. These products include cigarettes, chewing tobacco, and vaping devices, such as e-cigarettes. These can delay bone healing. If you need help quitting, ask your health care provider. Maintain a healthy weight. Extra weight puts stress on your back. Keep all follow-up visits. Your health care provider will monitor if your fracture is healing and help you manage your pain. Contact a health care provider if: You have pain that gets worse or does not get better. Get help right away if: You have severe back pain. Your ability to control your bowel or bladder changes. You develop weakness or numbness in your legs. You cannot stand or walk. Summary Spondylolysis is a small break or crack (stress fracture) in a bone in the spine (vertebra) in the lower back (lumbar spine). This condition may be treated by resting, medicines, physical therapy, wearing a brace, or surgery. Rest and return to your normal activities as told by your health care provider. Contact a health care provider if you have pain that gets worse or does not get  better. This information is not intended to replace advice given to you by your health care provider. Make sure you discuss any questions you have with your health care provider. Document Revised: 10/17/2021 Document Reviewed: 10/17/2021 Elsevier Patient Education  2024 [employer].

## 2023-06-02 ENCOUNTER — Telehealth: Payer: Self-pay | Admitting: Nurse Practitioner

## 2023-06-02 NOTE — Telephone Encounter (Signed)
patient called to request ortho referral for back pain. He would like to go to [facility]. Please enter referral if recommended.

## 2023-06-25 DIAGNOSIS — M5459 Other low back pain: Secondary | ICD-10-CM | POA: Diagnosis not present

## 2023-06-25 DIAGNOSIS — M5451 Vertebrogenic low back pain: Secondary | ICD-10-CM | POA: Diagnosis not present

## 2023-07-09 ENCOUNTER — Other Ambulatory Visit: Payer: Self-pay | Admitting: Internal Medicine

## 2023-07-09 DIAGNOSIS — I10 Essential (primary) hypertension: Secondary | ICD-10-CM

## 2023-07-11 ENCOUNTER — Other Ambulatory Visit: Payer: Self-pay | Admitting: Nurse Practitioner

## 2023-07-11 DIAGNOSIS — I10 Essential (primary) hypertension: Secondary | ICD-10-CM

## 2023-07-23 ENCOUNTER — Encounter: Payer: Self-pay | Admitting: Nurse Practitioner

## 2023-07-23 NOTE — Progress Notes (Signed)
FOLLOW UP  Assessment and Plan:  Pt will return Monday to review insurance with [place] Hypertension At goal; continue medications:losartan 100 mg every day, hydrochlorothiazide 25 mg every day Monitor blood pressure at home; patient to call if consistently greater than 130/80 Continue DASH diet.   Reminder to go to the ER if any CP, SOB, nausea, dizziness, severe HA, changes vision/speech, left arm numbness and tingling and jaw pain.  Hyperlipidemia associated with T2DM (HCC)/ Statin myopathy Currently above goal; reports SE with 3 statins, zetia, welchol, nexletol not covered.  Discussed fenofibrate if trigs remain severely elevated; likely r/t last severe hyperglycemia LDL goal <70 Continue low cholesterol diet and exercise.  Check lipid panel.   Diabetes with diabetic chronic kidney disease and neuropathy and circulatory complications Continue medication: metformin 2000 mg daily, Trulicity 4.5 mg SQ QW Has been unable to check blood sugars as it is broken- new glucometer prescription sent to pharmacy Continue diet and exercise.  Perform daily foot/skin check, notify office of any concerning changes.  Check A1C  CKD II associated with T2DM (HCC) Increase fluids, avoid NSAIDS, monitor sugars, will monitor - CBC - CMP  Type 2 Diabetes Mellitus with Morbid obesity (HCC) Long discussion about weight loss, diet, and exercise  Recommended diet heavy in fruits and veggies and low in animal meats, cheeses, and dairy products, appropriate calorie intake Discussed ideal weight for height  Patient will work on cutting out processed foods -  Focus on whole foods;  Continue Phentermine and Trulicity Will follow up in 3 months  Vitamin D Def At goal at last visit; continue supplementation to maintain goal of 70-100 Defer Vit D level  Medication management -     CBC with Differential/Platelet -     COMPLETE METABOLIC PANEL WITH GFR -     Lipid panel -     Hemoglobin A1C w/out  eAG     Continue diet and meds as discussed. Further disposition pending results of labs. Discussed med's effects and SE's.   Over 30 minutes of exam, counseling, chart review, and critical decision making was performed.   Future Appointments  Date Time Provider Department Center  08/27/2023  3:45 PM Helane Gunther, DPM [facility]  12/17/2023  3:00 PM Raynelle Dick, NP [facility] None    ----------------------------------------------------------------------------------------------------------------------  HPI 67 y.o. male  presents for 3 month follow up on hypertension, cholesterol, diabetes, morbid obesity and vitamin D deficiency.   He is followed by Dr. Thurston Hole for Bil knee pain, worse L, s/p scope several years ago. Has been advised need for replacement but needs to lose weight. He uses brace and gabapentin, tylenol PRN pain, recently improved.   he is prescribed phentermine for weight loss, takes 1/2 tab daily only with perceived benefit during the day, but does admit to eating more in the evening. While on the medication they have lost 0 lbs since last visit. They deny palpitations, anxiety, trouble sleeping, elevated BP.   BMI is Body mass index is 45.89 kg/m., he is not working on diet, exercise limited due to knee pain. He is currently on Trulicity 4.5 mg SQ QW . Continue Phentermine, has been out of the medication recently.  Wt Readings from Last 3 Encounters:  07/24/23 (!) 301 lb 12.8 oz (136.9 kg)  05/21/23 282 lb 6.4 oz (128.1 kg)  04/09/23 271 lb 12.8 oz (123.3 kg)    He does have an upcoming MRI through orthopedics to evaluate back pain.   His does check  BP at home,controlled on losartan 100 mg every day, hydrochlorothiazide 25 mg every day  today their BP is BP: 136/84 BP Readings from Last 3 Encounters:  07/24/23 136/84  05/21/23 108/70  05/04/23 (!) 147/87  He does not workout. He denies chest pain, shortness of breath, dizziness   He is not on  cholesterol medication (SE with rosuvastatin, simvastatin, pravastatin, zetia, ? welchol) and denies myalgias. His cholesterol is not at goal. The cholesterol last visit was:   Lab Results  Component Value Date   CHOL 121 04/09/2023   HDL 53 04/09/2023   LDLCALC 49 04/09/2023   TRIG 100 04/09/2023   CHOLHDL 2.3 04/09/2023    He has been working on diet and exercise for T2 diabetes (treated by metformin 2000 mg daily, Trulicity 4.5 mg SQ QW he has fairly labile A1Cs depending on lifestyle adherence - typically in 6-7 range) and denies foot ulcerations, hyperglycemia, hypoglycemia , increased appetite, nausea, polydipsia, polyuria, visual disturbances, vomiting and weight loss.   Has not been checking his blood sugars because his meter is broken  Down from 400+ in Jan.   He does have peripheral sensory neuropathy, prescribed gabapentin 100 mg, typically takes BID  Last A1C in the office was:  Lab Results  Component Value Date   HGBA1C 6.5 (H) 04/09/2023   He has CKD II associated with T2DM monitored at this office:  Lab Results  Component Value Date   GFRAA 79 12/17/2020   Patient is on Vitamin D supplement and at goal at last check:    Lab Results  Component Value Date   VD25OH 105 (H) 04/09/2023       Current Medications:  Current Outpatient Medications on File Prior to Visit  Medication Sig   Alcohol Swabs (DROPSAFE ALCOHOL PREP) 70 % PADS USE TO CHECK BLOOD SUGAR 1 TIME DAILY   Ascorbic Acid (VITA-C PO) Take by mouth. 3 times a week   aspirin EC 81 MG tablet Take 81 mg by mouth daily.   Bempedoic Acid (NEXLETOL) 180 MG TABS Take 1 tablet (180 mg total) by mouth daily.   Blood Glucose Calibration (TRUE METRIX LEVEL 1) Low SOLN Use with glucometer. Check blood sugar 1 time daily-E11.2.   Blood Glucose Monitoring Suppl (TRUE METRIX METER) w/Device KIT Check blood sugar 1 time daily- E11.2   Cholecalciferol (VITAMIN D-3) 5000 UNITS TABS Take 1 tablet by mouth daily.   Evolocumab  (REPATHA SURECLICK) 140 MG/ML SOAJ Inject 140 mg into the skin every 14 (fourteen) days. (Patient not taking: Reported on 05/21/2023)   Evolocumab (REPATHA SURECLICK) 140 MG/ML SOAJ Inject 140 mg into the skin every 14 (fourteen) days. (Patient not taking: Reported on 05/21/2023)   ferrous sulfate 325 (65 FE) MG EC tablet Take 325 mg by mouth 3 (three) times daily with meals. Takes Mondays, Wednesdays, Fidays   gabapentin (NEURONTIN) 100 MG capsule TAKE 1 CAPSULE THREE TIMES DAILY FOR NEUROPATHY PAINS IN HANDS   glucose blood (TRUE METRIX BLOOD GLUCOSE TEST) test strip CHECK BLOOD SUGAR 1 TIME DAILY   hydrochlorothiazide (HYDRODIURIL) 25 MG tablet TAKE 1 TABLET DAILY FOR BLOOD PRESSURE AND FLUID   lidocaine (XYLOCAINE) 5 % ointment Apply 1 Application topically as needed.   losartan (COZAAR) 100 MG tablet TAKE 1 TABLET EVERY DAY FOR BLOOD PRESSURE AND DIABETIC KIDNEY PROTECTION   Magnesium-Zinc (MAGNESIUM-CHELATED ZINC PO) Take 400 mg by mouth daily. Takes 1200 mg daily.   meloxicam (MOBIC) 15 MG tablet Take 1 tablet (15 mg total) by  mouth daily.   metFORMIN (GLUCOPHAGE-XR) 500 MG 24 hr tablet TAKE 2 TABLETS TWICE A DAY WITH MEALS FOR DIABETES   Multiple Vitamins-Minerals (THERAGRAN-M PREMIER 50 PLUS PO) Take 1 tablet by mouth daily.   Omega-3 Fatty Acids (FISH OIL) 500 MG CAPS Take by mouth daily.   phentermine (ADIPEX-P) 37.5 MG tablet Take  1 tablet every morning for Dieting & Weight loss                                                    /                                                                   TAKE                                         BY                                                 MOUTH   predniSONE (DELTASONE) 10 MG tablet 1 tab 3 x day for 2 days, then 1 tab 2 x day for 2 days, then 1 tab 1 x day for 3 days   tadalafil (CIALIS) 20 MG tablet Take  1/2 to 1 tablet  every 2 to 3 days  if needede for XXXX                              /          TAKE                                              BY                  MOUTH   TRUEplus Lancets 30G MISC CHECK BLOOD SUGAR 1 TIME DAILY   TRULICITY 4.5 MG/0.5ML SOPN Inject 1 pen  (4.5 mg) into Skin every 7 days for Diabetes   (e11.29)   No current facility-administered medications on file prior to visit.     Allergies:  Allergies  Allergen Reactions   Crestor [Rosuvastatin Calcium] Other (See Comments)    Cramping   Simvastatin Other (See Comments)    Muscle cramps     Medical History:  Past Medical History:  Diagnosis Date   Diabetes mellitus without complication (HCC)    Hyperlipidemia, mixed 09/29/2012   Hypertension    Vitamin D deficiency    Family history- Reviewed and unchanged Social history- Reviewed and unchanged   Review of Systems:  Review of Systems  Constitutional:  Negative for malaise/fatigue and weight loss.  HENT:  Negative for hearing loss and tinnitus.   Eyes:  Negative for blurred vision and  double vision.  Respiratory:  Negative for cough, shortness of breath and wheezing.   Cardiovascular:  Negative for chest pain, palpitations, orthopnea, claudication and leg swelling.  Gastrointestinal:  Negative for abdominal pain, blood in stool, constipation, diarrhea, heartburn, melena, nausea and vomiting.  Genitourinary: Negative.   Musculoskeletal:  Positive for joint pain (bilateral knees, shooting pain left leg). Negative for myalgias.  Skin:  Negative for rash.  Neurological:  Negative for dizziness, tingling, sensory change, weakness and headaches.  Endo/Heme/Allergies:  Negative for polydipsia.  Psychiatric/Behavioral: Negative.    All other systems reviewed and are negative.   Physical Exam: BP 136/84   Pulse 90   Temp 97.9 F (36.6 C)   Resp 16   Ht 5\' 8"  (1.727 m)   Wt (!) 301 lb 12.8 oz (136.9 kg)   SpO2 98%   BMI 45.89 kg/m  Wt Readings from Last 3 Encounters:  07/24/23 (!) 301 lb 12.8 oz (136.9 kg)  05/21/23 282 lb 6.4 oz (128.1 kg)  04/09/23 271 lb 12.8 oz (123.3 kg)    General Appearance: Morbidly obese AA male, well dressed, in no apparent distress. Eyes: PERRLA, EOMs, conjunctiva no swelling or erythema Sinuses: No Frontal/maxillary tenderness ENT/Mouth: Ext aud canals clear, TMs without erythema, bulging. No erythema, swelling, or exudate on post pharynx.  Hearing normal.  Neck: Supple, thyroid normal.  Respiratory: Respiratory effort normal, BS equal bilaterally without rales, rhonchi, wheezing or stridor.  Cardio: RRR with no MRGs. Diminished peripheral pulses without edema.  Abdomen: Soft, obese abdomen limits exam, + BS.  Non tender, no guarding, rebound, palpable hernias, masses. Lymphatics: Non tender without lymphadenopathy.  Musculoskeletal: Full ROM except left knee,wearing brace, no effusion. Slow Antalgic gait Skin: Warm, dry without rashes, lesions, ecchymosis.  Neuro: Cranial nerves intact. No cerebellar symptoms.  Psych: Awake and oriented X 3, normal affect, Insight and Judgment appropriate.    Raynelle Dick, NP 10:42 AM [facility]

## 2023-07-24 ENCOUNTER — Ambulatory Visit (INDEPENDENT_AMBULATORY_CARE_PROVIDER_SITE_OTHER): Payer: Medicare HMO | Admitting: Nurse Practitioner

## 2023-07-24 ENCOUNTER — Encounter: Payer: Self-pay | Admitting: Nurse Practitioner

## 2023-07-24 VITALS — BP 136/84 | HR 90 | Temp 97.9°F | Resp 16 | Ht 68.0 in | Wt 301.8 lb

## 2023-07-24 DIAGNOSIS — N182 Chronic kidney disease, stage 2 (mild): Secondary | ICD-10-CM

## 2023-07-24 DIAGNOSIS — I10 Essential (primary) hypertension: Secondary | ICD-10-CM

## 2023-07-24 DIAGNOSIS — T466X5A Adverse effect of antihyperlipidemic and antiarteriosclerotic drugs, initial encounter: Secondary | ICD-10-CM | POA: Diagnosis not present

## 2023-07-24 DIAGNOSIS — Z79899 Other long term (current) drug therapy: Secondary | ICD-10-CM

## 2023-07-24 DIAGNOSIS — E1169 Type 2 diabetes mellitus with other specified complication: Secondary | ICD-10-CM

## 2023-07-24 DIAGNOSIS — E1142 Type 2 diabetes mellitus with diabetic polyneuropathy: Secondary | ICD-10-CM | POA: Diagnosis not present

## 2023-07-24 DIAGNOSIS — E559 Vitamin D deficiency, unspecified: Secondary | ICD-10-CM

## 2023-07-24 DIAGNOSIS — E1122 Type 2 diabetes mellitus with diabetic chronic kidney disease: Secondary | ICD-10-CM | POA: Diagnosis not present

## 2023-07-24 DIAGNOSIS — G720 Drug-induced myopathy: Secondary | ICD-10-CM

## 2023-07-24 DIAGNOSIS — E785 Hyperlipidemia, unspecified: Secondary | ICD-10-CM

## 2023-07-24 MED ORDER — PHENTERMINE HCL 37.5 MG PO TABS: ORAL_TABLET | ORAL | 1 refills | Status: AC

## 2023-07-24 NOTE — Patient Instructions (Signed)

## 2023-07-25 LAB — COMPLETE METABOLIC PANEL WITH GFR
AG Ratio: 1.2 (calc) (ref 1.0–2.5)
ALT: 10 U/L (ref 9–46)
AST: 19 U/L (ref 10–35)
Albumin: 4.1 g/dL (ref 3.6–5.1)
Alkaline phosphatase (APISO): 66 U/L (ref 35–144)
BUN: 22 mg/dL (ref 7–25)
CO2: 28 mmol/L (ref 20–32)
Calcium: 9.5 mg/dL (ref 8.6–10.3)
Chloride: 103 mmol/L (ref 98–110)
Creat: 1.04 mg/dL (ref 0.70–1.35)
Globulin: 3.5 g/dL (ref 1.9–3.7)
Glucose, Bld: 130 mg/dL (ref 65–139)
Potassium: 4.2 mmol/L (ref 3.5–5.3)
Sodium: 139 mmol/L (ref 135–146)
Total Bilirubin: 0.4 mg/dL (ref 0.2–1.2)
Total Protein: 7.6 g/dL (ref 6.1–8.1)
eGFR: 79 mL/min/{1.73_m2} (ref >=60)

## 2023-07-25 LAB — CBC WITH DIFFERENTIAL/PLATELET
Absolute Lymphocytes: 1648 {cells}/uL (ref 850–3900)
Absolute Monocytes: 721 {cells}/uL (ref 200–950)
Basophils Absolute: 58 {cells}/uL (ref 0–200)
Basophils Relative: 1.1 %
Eosinophils Absolute: 223 {cells}/uL (ref 15–500)
Eosinophils Relative: 4.2 %
HCT: 40.4 % (ref 38.5–50.0)
Hemoglobin: 12.9 g/dL — ABNORMAL LOW (ref 13.2–17.1)
MCH: 26.6 pg — ABNORMAL LOW (ref 27.0–33.0)
MCHC: 31.9 g/dL — ABNORMAL LOW (ref 32.0–36.0)
MCV: 83.3 fL (ref 80.0–100.0)
MPV: 10 fL (ref 7.5–12.5)
Monocytes Relative: 13.6 %
Neutro Abs: 2650 {cells}/uL (ref 1500–7800)
Neutrophils Relative %: 50 %
Platelets: 348 10*3/uL (ref 140–400)
RBC: 4.85 10*6/uL (ref 4.20–5.80)
RDW: 13.1 % (ref 11.0–15.0)
Total Lymphocyte: 31.1 %
WBC: 5.3 10*3/uL (ref 3.8–10.8)

## 2023-07-25 LAB — LIPID PANEL
Cholesterol: 200 mg/dL — ABNORMAL HIGH (ref <200)
HDL: 51 mg/dL (ref >=40)
LDL Cholesterol (Calc): 129 mg/dL — ABNORMAL HIGH
Non-HDL Cholesterol (Calc): 149 mg/dL — ABNORMAL HIGH (ref <130)
Total CHOL/HDL Ratio: 3.9 (calc) (ref <5.0)
Triglycerides: 92 mg/dL (ref <150)

## 2023-07-25 LAB — HEMOGLOBIN A1C W/OUT EAG: Hgb A1c MFr Bld: 6.8 %{Hb} — ABNORMAL HIGH (ref <5.7)

## 2023-07-27 NOTE — Progress Notes (Signed)
He could try to get It on financial assistance from the company

## 2023-08-27 ENCOUNTER — Ambulatory Visit: Payer: Medicare HMO | Admitting: Podiatry

## 2023-08-28 ENCOUNTER — Other Ambulatory Visit: Payer: Self-pay

## 2023-08-28 DIAGNOSIS — E1122 Type 2 diabetes mellitus with diabetic chronic kidney disease: Secondary | ICD-10-CM

## 2023-08-28 MED ORDER — TRULICITY 4.5 MG/0.5ML ~~LOC~~ SOAJ: SUBCUTANEOUS | 3 refills | Status: AC

## 2023-08-31 ENCOUNTER — Telehealth: Payer: Self-pay

## 2023-08-31 NOTE — Telephone Encounter (Signed)
 Patient was calling about his cholesterol medication. States that he has never taken the Nexletol. He had tried to call the company to use a prescription card but they no longer use that.  Wants to know what the next step is?

## 2023-08-31 NOTE — Telephone Encounter (Signed)
 Refer to Lipid clinic so they can work to find a medication that he can afford and tolerate

## 2023-09-01 NOTE — Telephone Encounter (Signed)
 Patient advised.

## 2023-09-11 ENCOUNTER — Ambulatory Visit: Payer: Medicare HMO | Admitting: Podiatry

## 2023-09-21 ENCOUNTER — Ambulatory Visit: Payer: Medicare HMO | Admitting: Podiatry

## 2023-09-29 ENCOUNTER — Encounter: Payer: Self-pay | Admitting: *Deleted

## 2023-10-08 ENCOUNTER — Other Ambulatory Visit: Payer: Self-pay

## 2023-10-08 MED ORDER — TADALAFIL 20 MG PO TABS: ORAL_TABLET | ORAL | 1 refills | Status: AC

## 2023-10-22 ENCOUNTER — Encounter: Payer: Self-pay | Admitting: *Deleted

## 2023-10-30 ENCOUNTER — Ambulatory Visit: Payer: Medicare HMO | Admitting: Internal Medicine

## 2023-12-17 ENCOUNTER — Encounter: Payer: Medicare HMO | Admitting: Nurse Practitioner

## 2024-01-30 LAB — COLOGUARD: COLOGUARD: NEGATIVE

## 2024-02-01 ENCOUNTER — Encounter: Payer: Medicare HMO | Admitting: Nurse Practitioner
